# Patient Record
Sex: Female | Born: 1935 | Race: White | Hispanic: No | Marital: Single | Smoking: Former smoker
Health system: Southern US, Community
[De-identification: ages and names within clinical notes are randomized; demographics above are authoritative.]

## PROBLEM LIST (undated history)

## (undated) DIAGNOSIS — N393 Stress incontinence (female) (male): Secondary | ICD-10-CM

## (undated) DIAGNOSIS — R634 Abnormal weight loss: Secondary | ICD-10-CM

## (undated) DIAGNOSIS — R351 Nocturia: Secondary | ICD-10-CM

## (undated) DIAGNOSIS — R5381 Other malaise: Secondary | ICD-10-CM

## (undated) DIAGNOSIS — F329 Major depressive disorder, single episode, unspecified: Secondary | ICD-10-CM

## (undated) DIAGNOSIS — E871 Hypo-osmolality and hyponatremia: Secondary | ICD-10-CM

## (undated) DIAGNOSIS — Z9181 History of falling: Secondary | ICD-10-CM

## (undated) DIAGNOSIS — K5900 Constipation, unspecified: Secondary | ICD-10-CM

## (undated) DIAGNOSIS — H04129 Dry eye syndrome of unspecified lacrimal gland: Secondary | ICD-10-CM

## (undated) DIAGNOSIS — R42 Dizziness and giddiness: Secondary | ICD-10-CM

## (undated) DIAGNOSIS — R269 Unspecified abnormalities of gait and mobility: Secondary | ICD-10-CM

## (undated) DIAGNOSIS — I2510 Atherosclerotic heart disease of native coronary artery without angina pectoris: Secondary | ICD-10-CM

## (undated) DIAGNOSIS — N368 Other specified disorders of urethra: Secondary | ICD-10-CM

## (undated) DIAGNOSIS — N289 Disorder of kidney and ureter, unspecified: Secondary | ICD-10-CM

## (undated) DIAGNOSIS — IMO0002 Reserved for concepts with insufficient information to code with codable children: Secondary | ICD-10-CM

## (undated) DIAGNOSIS — R32 Unspecified urinary incontinence: Secondary | ICD-10-CM

## (undated) DIAGNOSIS — I10 Essential (primary) hypertension: Secondary | ICD-10-CM

## (undated) DIAGNOSIS — E039 Hypothyroidism, unspecified: Secondary | ICD-10-CM

## (undated) DIAGNOSIS — H3530 Unspecified macular degeneration: Secondary | ICD-10-CM

## (undated) DIAGNOSIS — C50919 Malignant neoplasm of unspecified site of unspecified female breast: Secondary | ICD-10-CM

## (undated) DIAGNOSIS — R110 Nausea: Secondary | ICD-10-CM

## (undated) DIAGNOSIS — H353290 Exudative age-related macular degeneration, unspecified eye, stage unspecified: Secondary | ICD-10-CM

## (undated) DIAGNOSIS — I259 Chronic ischemic heart disease, unspecified: Secondary | ICD-10-CM

## (undated) DIAGNOSIS — E785 Hyperlipidemia, unspecified: Secondary | ICD-10-CM

## (undated) DIAGNOSIS — M8448XA Pathological fracture, other site, initial encounter for fracture: Secondary | ICD-10-CM

## (undated) DIAGNOSIS — R5383 Other fatigue: Secondary | ICD-10-CM

## (undated) DIAGNOSIS — R413 Other amnesia: Secondary | ICD-10-CM

## (undated) DIAGNOSIS — B353 Tinea pedis: Secondary | ICD-10-CM

## (undated) DIAGNOSIS — D649 Anemia, unspecified: Secondary | ICD-10-CM

## (undated) HISTORY — PX: AV FISTULA REPAIR: SHX563

## (undated) HISTORY — DX: Tinea pedis: B35.3

## (undated) HISTORY — DX: Reserved for concepts with insufficient information to code with codable children: IMO0002

## (undated) HISTORY — DX: Other specified disorders of urethra: N36.8

## (undated) HISTORY — DX: Exudative age-related macular degeneration, unspecified eye, stage unspecified: H35.3290

## (undated) HISTORY — DX: Dizziness and giddiness: R42

## (undated) HISTORY — DX: Hyperlipidemia, unspecified: E78.5

## (undated) HISTORY — DX: Malignant neoplasm of unspecified site of unspecified female breast: C50.919

## (undated) HISTORY — DX: Essential (primary) hypertension: I10

## (undated) HISTORY — DX: Other amnesia: R41.3

## (undated) HISTORY — DX: Disorder of kidney and ureter, unspecified: N28.9

## (undated) HISTORY — DX: Atherosclerotic heart disease of native coronary artery without angina pectoris: I25.10

## (undated) HISTORY — DX: Hypothyroidism, unspecified: E03.9

## (undated) HISTORY — DX: Constipation, unspecified: K59.00

## (undated) HISTORY — DX: Unspecified urinary incontinence: R32

## (undated) HISTORY — DX: Unspecified abnormalities of gait and mobility: R26.9

## (undated) HISTORY — DX: Chronic ischemic heart disease, unspecified: I25.9

## (undated) HISTORY — DX: Unspecified macular degeneration: H35.30

## (undated) HISTORY — DX: Stress incontinence (female) (male): N39.3

## (undated) HISTORY — DX: Nausea: R11.0

## (undated) HISTORY — PX: CORONARY ANGIOPLASTY WITH STENT PLACEMENT: SHX49

## (undated) HISTORY — DX: Hypo-osmolality and hyponatremia: E87.1

## (undated) HISTORY — DX: Nocturia: R35.1

## (undated) HISTORY — DX: Anemia, unspecified: D64.9

## (undated) HISTORY — DX: Pathological fracture, other site, initial encounter for fracture: M84.48XA

## (undated) HISTORY — DX: Dry eye syndrome of unspecified lacrimal gland: H04.129

## (undated) HISTORY — DX: History of falling: Z91.81

## (undated) HISTORY — PX: MASTECTOMY: SHX3

## (undated) HISTORY — DX: Major depressive disorder, single episode, unspecified: F32.9

## (undated) HISTORY — DX: Other malaise: R53.81

## (undated) HISTORY — DX: Other fatigue: R53.83

## (undated) HISTORY — DX: Abnormal weight loss: R63.4

---

## 1994-03-23 HISTORY — PX: CORONARY ANGIOPLASTY: SHX604

## 1999-10-31 ENCOUNTER — Ambulatory Visit (HOSPITAL_COMMUNITY): Admission: RE | Admit: 1999-10-31 | Discharge: 1999-10-31 | Payer: Self-pay | Admitting: *Deleted

## 2000-10-25 ENCOUNTER — Inpatient Hospital Stay (HOSPITAL_COMMUNITY): Admission: EM | Admit: 2000-10-25 | Discharge: 2000-10-27 | Payer: Self-pay | Admitting: *Deleted

## 2000-10-25 ENCOUNTER — Encounter (HOSPITAL_BASED_OUTPATIENT_CLINIC_OR_DEPARTMENT_OTHER): Payer: Self-pay | Admitting: Internal Medicine

## 2007-11-01 ENCOUNTER — Ambulatory Visit (HOSPITAL_COMMUNITY): Admission: RE | Admit: 2007-11-01 | Discharge: 2007-11-01 | Payer: Self-pay | Admitting: General Surgery

## 2007-11-01 ENCOUNTER — Encounter (HOSPITAL_BASED_OUTPATIENT_CLINIC_OR_DEPARTMENT_OTHER): Payer: Self-pay | Admitting: General Surgery

## 2007-12-03 ENCOUNTER — Encounter (HOSPITAL_BASED_OUTPATIENT_CLINIC_OR_DEPARTMENT_OTHER): Payer: Self-pay | Admitting: General Surgery

## 2007-12-03 ENCOUNTER — Ambulatory Visit (HOSPITAL_COMMUNITY): Admission: RE | Admit: 2007-12-03 | Discharge: 2007-12-04 | Payer: Self-pay | Admitting: General Surgery

## 2007-12-28 ENCOUNTER — Ambulatory Visit: Payer: Self-pay | Admitting: Oncology

## 2009-11-27 ENCOUNTER — Ambulatory Visit: Payer: Self-pay | Admitting: Diagnostic Radiology

## 2009-11-27 ENCOUNTER — Emergency Department (HOSPITAL_BASED_OUTPATIENT_CLINIC_OR_DEPARTMENT_OTHER): Admission: EM | Admit: 2009-11-27 | Discharge: 2009-11-27 | Payer: Self-pay | Admitting: Emergency Medicine

## 2010-05-27 ENCOUNTER — Ambulatory Visit: Payer: Self-pay | Admitting: Cardiology

## 2010-10-02 ENCOUNTER — Ambulatory Visit: Payer: Self-pay | Admitting: Cardiology

## 2010-10-06 ENCOUNTER — Inpatient Hospital Stay (HOSPITAL_COMMUNITY)
Admission: EM | Admit: 2010-10-06 | Discharge: 2010-10-14 | Payer: Self-pay | Source: Home / Self Care | Attending: Internal Medicine | Admitting: Internal Medicine

## 2010-10-07 HISTORY — PX: HIP SURGERY: SHX245

## 2010-10-22 ENCOUNTER — Ambulatory Visit: Payer: Self-pay | Admitting: Cardiology

## 2010-12-23 LAB — BASIC METABOLIC PANEL
BUN: 12 mg/dL (ref 6–23)
BUN: 15 mg/dL (ref 6–23)
BUN: 16 mg/dL (ref 6–23)
BUN: 17 mg/dL (ref 6–23)
BUN: 18 mg/dL (ref 6–23)
BUN: 28 mg/dL — ABNORMAL HIGH (ref 6–23)
BUN: 7 mg/dL (ref 6–23)
BUN: 9 mg/dL (ref 6–23)
BUN: 9 mg/dL (ref 6–23)
CO2: 22 mEq/L (ref 19–32)
CO2: 22 mEq/L (ref 19–32)
CO2: 24 mEq/L (ref 19–32)
CO2: 24 mEq/L (ref 19–32)
CO2: 26 mEq/L (ref 19–32)
CO2: 26 mEq/L (ref 19–32)
Calcium: 7.7 mg/dL — ABNORMAL LOW (ref 8.4–10.5)
Calcium: 7.8 mg/dL — ABNORMAL LOW (ref 8.4–10.5)
Calcium: 7.9 mg/dL — ABNORMAL LOW (ref 8.4–10.5)
Calcium: 8 mg/dL — ABNORMAL LOW (ref 8.4–10.5)
Calcium: 8.1 mg/dL — ABNORMAL LOW (ref 8.4–10.5)
Calcium: 8.2 mg/dL — ABNORMAL LOW (ref 8.4–10.5)
Calcium: 8.4 mg/dL (ref 8.4–10.5)
Calcium: 8.4 mg/dL (ref 8.4–10.5)
Calcium: 8.5 mg/dL (ref 8.4–10.5)
Calcium: 8.8 mg/dL (ref 8.4–10.5)
Calcium: 9.1 mg/dL (ref 8.4–10.5)
Chloride: 103 mEq/L (ref 96–112)
Chloride: 105 mEq/L (ref 96–112)
Chloride: 92 mEq/L — ABNORMAL LOW (ref 96–112)
Chloride: 94 mEq/L — ABNORMAL LOW (ref 96–112)
Chloride: 96 mEq/L (ref 96–112)
Creatinine, Ser: 0.72 mg/dL (ref 0.4–1.2)
Creatinine, Ser: 0.74 mg/dL (ref 0.4–1.2)
Creatinine, Ser: 0.76 mg/dL (ref 0.4–1.2)
Creatinine, Ser: 0.8 mg/dL (ref 0.4–1.2)
Creatinine, Ser: 0.85 mg/dL (ref 0.4–1.2)
Creatinine, Ser: 0.87 mg/dL (ref 0.4–1.2)
Creatinine, Ser: 0.9 mg/dL (ref 0.4–1.2)
Creatinine, Ser: 0.92 mg/dL (ref 0.4–1.2)
Creatinine, Ser: 0.98 mg/dL (ref 0.4–1.2)
Creatinine, Ser: 1.04 mg/dL (ref 0.4–1.2)
GFR calc Af Amer: 59 mL/min — ABNORMAL LOW (ref >60)
GFR calc Af Amer: >60 mL/min (ref >60)
GFR calc Af Amer: >60 mL/min (ref >60)
GFR calc Af Amer: >60 mL/min (ref >60)
GFR calc Af Amer: >60 mL/min (ref >60)
GFR calc Af Amer: >60 mL/min (ref >60)
GFR calc Af Amer: >60 mL/min (ref >60)
GFR calc Af Amer: >60 mL/min (ref >60)
GFR calc non Af Amer: 49 mL/min — ABNORMAL LOW (ref >60)
GFR calc non Af Amer: 53 mL/min — ABNORMAL LOW (ref >60)
GFR calc non Af Amer: 58 mL/min — ABNORMAL LOW (ref >60)
GFR calc non Af Amer: 60 mL/min — ABNORMAL LOW (ref >60)
GFR calc non Af Amer: >60 mL/min (ref >60)
GFR calc non Af Amer: >60 mL/min (ref >60)
GFR calc non Af Amer: >60 mL/min (ref >60)
GFR calc non Af Amer: >60 mL/min (ref >60)
GFR calc non Af Amer: >60 mL/min (ref >60)
Glucose, Bld: 100 mg/dL — ABNORMAL HIGH (ref 70–99)
Glucose, Bld: 101 mg/dL — ABNORMAL HIGH (ref 70–99)
Glucose, Bld: 107 mg/dL — ABNORMAL HIGH (ref 70–99)
Glucose, Bld: 109 mg/dL — ABNORMAL HIGH (ref 70–99)
Glucose, Bld: 124 mg/dL — ABNORMAL HIGH (ref 70–99)
Glucose, Bld: 133 mg/dL — ABNORMAL HIGH (ref 70–99)
Glucose, Bld: 136 mg/dL — ABNORMAL HIGH (ref 70–99)
Glucose, Bld: 94 mg/dL (ref 70–99)
Glucose, Bld: 99 mg/dL (ref 70–99)
Potassium: 3.6 mEq/L (ref 3.5–5.1)
Potassium: 4 mEq/L (ref 3.5–5.1)
Potassium: 4.2 mEq/L (ref 3.5–5.1)
Potassium: 4.2 mEq/L (ref 3.5–5.1)
Potassium: 4.3 mEq/L (ref 3.5–5.1)
Potassium: 4.6 mEq/L (ref 3.5–5.1)
Sodium: 120 mEq/L — ABNORMAL LOW (ref 135–145)
Sodium: 124 mEq/L — ABNORMAL LOW (ref 135–145)
Sodium: 126 mEq/L — ABNORMAL LOW (ref 135–145)
Sodium: 131 mEq/L — ABNORMAL LOW (ref 135–145)
Sodium: 136 mEq/L (ref 135–145)
Sodium: 136 mEq/L (ref 135–145)

## 2010-12-23 LAB — DIFFERENTIAL
Basophils Absolute: 0 10*3/uL (ref 0.0–0.1)
Basophils Absolute: 0 10*3/uL (ref 0.0–0.1)
Basophils Absolute: 0 10*3/uL (ref 0.0–0.1)
Basophils Absolute: 0.1 10*3/uL (ref 0.0–0.1)
Basophils Relative: 0 % (ref 0–1)
Basophils Relative: 0 % (ref 0–1)
Basophils Relative: 1 % (ref 0–1)
Eosinophils Absolute: 0.1 10*3/uL (ref 0.0–0.7)
Eosinophils Absolute: 0.1 10*3/uL (ref 0.0–0.7)
Eosinophils Absolute: 0.3 10*3/uL (ref 0.0–0.7)
Eosinophils Relative: 1 % (ref 0–5)
Eosinophils Relative: 1 % (ref 0–5)
Eosinophils Relative: 2 % (ref 0–5)
Eosinophils Relative: 4 % (ref 0–5)
Lymphocytes Relative: 10 % — ABNORMAL LOW (ref 12–46)
Lymphocytes Relative: 12 % (ref 12–46)
Lymphocytes Relative: 14 % (ref 12–46)
Lymphocytes Relative: 9 % — ABNORMAL LOW (ref 12–46)
Lymphs Abs: 0.9 10*3/uL (ref 0.7–4.0)
Lymphs Abs: 0.9 10*3/uL (ref 0.7–4.0)
Lymphs Abs: 1 10*3/uL (ref 0.7–4.0)
Lymphs Abs: 1 10*3/uL (ref 0.7–4.0)
Monocytes Absolute: 0.5 10*3/uL (ref 0.1–1.0)
Monocytes Absolute: 0.6 10*3/uL (ref 0.1–1.0)
Monocytes Absolute: 0.7 10*3/uL (ref 0.1–1.0)
Monocytes Absolute: 0.9 10*3/uL (ref 0.1–1.0)
Monocytes Relative: 6 % (ref 3–12)
Monocytes Relative: 6 % (ref 3–12)
Neutro Abs: 4.9 10*3/uL (ref 1.7–7.7)
Neutro Abs: 7.6 10*3/uL (ref 1.7–7.7)
Neutro Abs: 9.6 10*3/uL — ABNORMAL HIGH (ref 1.7–7.7)
Neutrophils Relative %: 70 % (ref 43–77)
Neutrophils Relative %: 83 % — ABNORMAL HIGH (ref 43–77)
Neutrophils Relative %: 84 % — ABNORMAL HIGH (ref 43–77)

## 2010-12-23 LAB — CBC
HCT: 21.3 % — ABNORMAL LOW (ref 36.0–46.0)
HCT: 22.9 % — ABNORMAL LOW (ref 36.0–46.0)
HCT: 25.5 % — ABNORMAL LOW (ref 36.0–46.0)
HCT: 25.7 % — ABNORMAL LOW (ref 36.0–46.0)
HCT: 29.4 % — ABNORMAL LOW (ref 36.0–46.0)
HCT: 32.1 % — ABNORMAL LOW (ref 36.0–46.0)
Hemoglobin: 10.8 g/dL — ABNORMAL LOW (ref 12.0–15.0)
Hemoglobin: 7.2 g/dL — ABNORMAL LOW (ref 12.0–15.0)
Hemoglobin: 7.8 g/dL — ABNORMAL LOW (ref 12.0–15.0)
Hemoglobin: 8.6 g/dL — ABNORMAL LOW (ref 12.0–15.0)
Hemoglobin: 8.7 g/dL — ABNORMAL LOW (ref 12.0–15.0)
Hemoglobin: 9.8 g/dL — ABNORMAL LOW (ref 12.0–15.0)
MCH: 31.7 pg (ref 26.0–34.0)
MCH: 31.8 pg (ref 26.0–34.0)
MCH: 31.8 pg (ref 26.0–34.0)
MCH: 31.9 pg (ref 26.0–34.0)
MCH: 32 pg (ref 26.0–34.0)
MCHC: 33.3 g/dL (ref 30.0–36.0)
MCHC: 33.5 g/dL (ref 30.0–36.0)
MCHC: 33.6 g/dL (ref 30.0–36.0)
MCHC: 33.7 g/dL (ref 30.0–36.0)
MCHC: 34.1 g/dL (ref 30.0–36.0)
MCHC: 34.1 g/dL (ref 30.0–36.0)
MCHC: 35.1 g/dL (ref 30.0–36.0)
MCV: 90.6 fL (ref 78.0–100.0)
MCV: 91.8 fL (ref 78.0–100.0)
MCV: 93.1 fL (ref 78.0–100.0)
MCV: 94.7 fL (ref 78.0–100.0)
MCV: 95.5 fL (ref 78.0–100.0)
MCV: 95.5 fL (ref 78.0–100.0)
Platelets: 137 10*3/uL — ABNORMAL LOW (ref 150–400)
Platelets: 154 10*3/uL (ref 150–400)
Platelets: 155 10*3/uL (ref 150–400)
Platelets: 183 10*3/uL (ref 150–400)
Platelets: 194 10*3/uL (ref 150–400)
Platelets: 241 10*3/uL (ref 150–400)
Platelets: 275 10*3/uL (ref 150–400)
Platelets: 306 10*3/uL (ref 150–400)
RBC: 2.32 MIL/uL — ABNORMAL LOW (ref 3.87–5.11)
RBC: 2.46 MIL/uL — ABNORMAL LOW (ref 3.87–5.11)
RBC: 2.69 MIL/uL — ABNORMAL LOW (ref 3.87–5.11)
RBC: 2.97 MIL/uL — ABNORMAL LOW (ref 3.87–5.11)
RBC: 3.08 MIL/uL — ABNORMAL LOW (ref 3.87–5.11)
RBC: 3.39 MIL/uL — ABNORMAL LOW (ref 3.87–5.11)
RDW: 13.7 % (ref 11.5–15.5)
RDW: 14.1 % (ref 11.5–15.5)
RDW: 14.2 % (ref 11.5–15.5)
RDW: 14.2 % (ref 11.5–15.5)
RDW: 14.2 % (ref 11.5–15.5)
RDW: 14.4 % (ref 11.5–15.5)
RDW: 14.7 % (ref 11.5–15.5)
RDW: 15.2 % (ref 11.5–15.5)
WBC: 10.7 10*3/uL — ABNORMAL HIGH (ref 4.0–10.5)
WBC: 11.5 10*3/uL — ABNORMAL HIGH (ref 4.0–10.5)
WBC: 6.5 10*3/uL (ref 4.0–10.5)
WBC: 6.8 10*3/uL (ref 4.0–10.5)
WBC: 7.1 10*3/uL (ref 4.0–10.5)
WBC: 7.4 10*3/uL (ref 4.0–10.5)
WBC: 7.8 10*3/uL (ref 4.0–10.5)
WBC: 9.2 10*3/uL (ref 4.0–10.5)

## 2010-12-23 LAB — IRON: Iron: 21 ug/dL — ABNORMAL LOW (ref 42–135)

## 2010-12-23 LAB — HEMOGLOBIN AND HEMATOCRIT, BLOOD
HCT: 22.2 % — ABNORMAL LOW (ref 36.0–46.0)
Hemoglobin: 7.7 g/dL — ABNORMAL LOW (ref 12.0–15.0)

## 2010-12-23 LAB — PROTIME-INR
INR: 1.01 (ref 0.00–1.49)
INR: 1.25 (ref 0.00–1.49)
INR: 1.25 (ref 0.00–1.49)
INR: 1.32 (ref 0.00–1.49)
INR: 1.56 — ABNORMAL HIGH (ref 0.00–1.49)
INR: 1.91 — ABNORMAL HIGH (ref 0.00–1.49)
Prothrombin Time: 13.5 seconds (ref 11.6–15.2)
Prothrombin Time: 15.9 seconds — ABNORMAL HIGH (ref 11.6–15.2)
Prothrombin Time: 15.9 seconds — ABNORMAL HIGH (ref 11.6–15.2)
Prothrombin Time: 16.6 seconds — ABNORMAL HIGH (ref 11.6–15.2)
Prothrombin Time: 16.6 seconds — ABNORMAL HIGH (ref 11.6–15.2)
Prothrombin Time: 18.9 seconds — ABNORMAL HIGH (ref 11.6–15.2)

## 2010-12-23 LAB — MAGNESIUM
Magnesium: 1.8 mg/dL (ref 1.5–2.5)
Magnesium: 1.8 mg/dL (ref 1.5–2.5)

## 2010-12-23 LAB — URINALYSIS, ROUTINE W REFLEX MICROSCOPIC
Bilirubin Urine: NEGATIVE
Glucose, UA: NEGATIVE mg/dL
Hgb urine dipstick: NEGATIVE
Ketones, ur: NEGATIVE mg/dL
Nitrite: NEGATIVE
Protein, ur: NEGATIVE mg/dL
Specific Gravity, Urine: 1.011 (ref 1.005–1.030)
Urobilinogen, UA: 0.2 mg/dL (ref 0.0–1.0)
pH: 7.5 (ref 5.0–8.0)

## 2010-12-23 LAB — APTT: aPTT: 30 seconds (ref 24–37)

## 2010-12-23 LAB — CROSSMATCH
ABO/RH(D): O POS
Antibody Screen: NEGATIVE
Unit division: 0

## 2010-12-23 LAB — SAMPLE TO BLOOD BANK

## 2010-12-26 ENCOUNTER — Ambulatory Visit (INDEPENDENT_AMBULATORY_CARE_PROVIDER_SITE_OTHER): Payer: Medicare Other | Admitting: Cardiology

## 2010-12-26 DIAGNOSIS — Z79899 Other long term (current) drug therapy: Secondary | ICD-10-CM

## 2010-12-26 DIAGNOSIS — I119 Hypertensive heart disease without heart failure: Secondary | ICD-10-CM

## 2011-01-02 LAB — CULTURE, BLOOD (ROUTINE X 2): Culture: NO GROWTH

## 2011-01-02 LAB — CBC
HCT: 36.5 % (ref 36.0–46.0)
Platelets: 130 10*3/uL — ABNORMAL LOW (ref 150–400)
RDW: 13.4 % (ref 11.5–15.5)
WBC: 2.3 10*3/uL — ABNORMAL LOW (ref 4.0–10.5)

## 2011-01-02 LAB — URINE MICROSCOPIC-ADD ON

## 2011-01-02 LAB — URINALYSIS, ROUTINE W REFLEX MICROSCOPIC
Bilirubin Urine: NEGATIVE
Nitrite: NEGATIVE
Specific Gravity, Urine: 1.012 (ref 1.005–1.030)
pH: 6.5 (ref 5.0–8.0)

## 2011-01-02 LAB — HEPATIC FUNCTION PANEL
ALT: 31 U/L (ref 0–35)
AST: 46 U/L — ABNORMAL HIGH (ref 0–37)
Bilirubin, Direct: 0 mg/dL (ref 0.0–0.3)
Total Protein: 6.3 g/dL (ref 6.0–8.3)

## 2011-01-02 LAB — POCT CARDIAC MARKERS
CKMB, poc: 1.2 ng/mL (ref 1.0–8.0)
Troponin i, poc: <0.05 ng/mL (ref 0.00–0.09)

## 2011-01-02 LAB — BASIC METABOLIC PANEL
BUN: 21 mg/dL (ref 6–23)
Calcium: 8.3 mg/dL — ABNORMAL LOW (ref 8.4–10.5)
Creatinine, Ser: 0.9 mg/dL (ref 0.4–1.2)
GFR calc non Af Amer: 58 mL/min — ABNORMAL LOW (ref >60)
Glucose, Bld: 87 mg/dL (ref 70–99)
Potassium: 4.5 mEq/L (ref 3.5–5.1)

## 2011-01-02 LAB — DIFFERENTIAL
Basophils Absolute: 0 10*3/uL (ref 0.0–0.1)
Lymphocytes Relative: 23 % (ref 12–46)
Lymphs Abs: 0.5 10*3/uL — ABNORMAL LOW (ref 0.7–4.0)
Neutro Abs: 1.4 10*3/uL — ABNORMAL LOW (ref 1.7–7.7)
Neutrophils Relative %: 58 % (ref 43–77)

## 2011-01-03 ENCOUNTER — Telehealth: Payer: Self-pay | Admitting: *Deleted

## 2011-01-03 NOTE — Telephone Encounter (Signed)
Received a call from physical therapist and patient is being discharged today at patient and family request

## 2011-01-15 ENCOUNTER — Telehealth: Payer: Self-pay | Admitting: Cardiology

## 2011-01-29 ENCOUNTER — Encounter: Payer: Self-pay | Admitting: Cardiology

## 2011-02-11 ENCOUNTER — Encounter: Payer: Self-pay | Admitting: Cardiology

## 2011-02-13 ENCOUNTER — Telehealth: Payer: Self-pay | Admitting: Cardiology

## 2011-02-13 NOTE — Telephone Encounter (Signed)
Spoke with caregiver and patient was just d/c from rehab facility.  Gave her info on where she gets her medications.  She will call and get refills and call back if anything doesn't have remaining refills

## 2011-02-13 NOTE — Telephone Encounter (Signed)
OR CELL [phone]/CAREGIVER SAID PT WILL RUN OUT OF MEDS BEFORE HER APPT, CAN ENOUGH BE CALLED IN TO LAST HER UNTIL SHE COMES IN FOR HER NEXT APPT? PLACED CHART IN BOX.

## 2011-02-17 ENCOUNTER — Other Ambulatory Visit: Payer: Self-pay | Admitting: Cardiology

## 2011-02-17 DIAGNOSIS — E876 Hypokalemia: Secondary | ICD-10-CM

## 2011-02-17 MED ORDER — POTASSIUM CHLORIDE CRYS ER 20 MEQ PO TBCR: 20.0000 meq | EXTENDED_RELEASE_TABLET | Freq: Two times a day (BID) | ORAL | Status: DC

## 2011-02-17 NOTE — Telephone Encounter (Signed)
Refilled medication as requested.

## 2011-02-17 NOTE — Telephone Encounter (Signed)
POTASSIUM/USES [facility] [phone] IN [place]. MAIL THE MED. PLACED CHART IN BOX.

## 2011-02-20 ENCOUNTER — Other Ambulatory Visit: Payer: Self-pay | Admitting: *Deleted

## 2011-02-20 DIAGNOSIS — E039 Hypothyroidism, unspecified: Secondary | ICD-10-CM

## 2011-02-20 MED ORDER — LEVOTHYROXINE SODIUM 25 MCG PO TABS: ORAL_TABLET | ORAL | Status: DC

## 2011-02-20 NOTE — Telephone Encounter (Signed)
Refilled  Synthroid by fax to [facility]

## 2011-02-25 NOTE — Op Note (Signed)
Kendra Tapia, Kendra Tapia               ACCOUNT NO.:  [id number]   MEDICAL RECORD NO.:  [id number]          PATIENT TYPE:  OIB   LOCATION:  5123                         FACILITY:  [facility]   PHYSICIAN:  Leonie Man, M.D.   DATE OF BIRTH:  1912-02-15   DATE OF PROCEDURE:  12/03/2007  DATE OF DISCHARGE:                               OPERATIVE REPORT   PREOPERATIVE DIAGNOSIS:  Carcinoma of the left breast.   POSTOPERATIVE DIAGNOSIS:  Carcinoma of the left breast.   PROCEDURE:  Left total mastectomy and sentinel lymph node biopsy.   SURGEON:  Leonie Man, MD.   ASSISTANT:  Currie Paris, MD.   ANESTHESIA:  General.   NOTE:  Kendra Tapia is a 77 year old lady, who on a routine mammogram  was noted to have an area of calcifications, which on biopsy showed  DCIS.  She underwent a wide local excision of this area but did have  positive margins on her excision, as well as within the excisional DCIS  she was noted to have an invasive carcinoma.  She returns to the  operating room now for a total mastectomy and sentinel lymph node  biopsy.  The risks and potential benefits of surgery are well-known to  the patient.  She understands and gives her consent to same.   PROCEDURE:  Following the induction of satisfactory general anesthesia,  the patient was positioned supinely and the left breast is infiltrated  with 5 ml of methylene blue dye in the subareolar region for marking of  the sentinel lymph node.  It should be noted that prior to this  procedure, she was given a technetium sulfa colloid injection for  radiodiagnosis of the sentinel node.  Following this, the breast was  prepped and draped to be included in the sterile operative field  followed by identification of the patient as Kendra Tapia and the  breast to be operated on as the left breast was carried out.  I made an  elliptical incision around inclusive of the nipple, deepening this  through the skin and subcutaneous  tissue, raising a superomedial flap to  the clavicle and to the sternum and an inferolateral flap to the rectus  and to the latissimus dorsi.  The probe was introduced into the axilla  where a solitary sentinel lymph node was identified and forwarded for  pathologic evaluation.  Touch imprint on this node was negative for  carcinoma.  The breast tissues were then stripped from the chest wall  carrying the anterior pectoralis fascia laterally and inferiorly off the  edge of the pectoralis major and minor muscles and serratus anterior  muscles and up into the axilla.  A small portion of the axillary tail  was taken between clamps and secured with ties of #2-0 silk.  The  axillary tail was identified by the suture and the specimen forwarded  for pathologic evaluation.  All areas of dissection then checked for  hemostasis and noted to be dry, sponge and instrument counts were  verified, and the wound was closed after a #19-French Blake drain was  placed onto the flap  for drainage.  The subcutaneous tissues were closed  with  interrupted #2-0 Vicryl sutures, the skin was closed with staples.  A  sterile, compressive dressing was applied to the chest wall, the  anesthetic reversed, and the patient removed from the operating room to  the recovery room in stable condition.  She tolerated the procedure  well.      Leonie Man, M.D.  Electronically Signed     PB/MEDQ  D:  12/03/2007  T:  12/03/2007  Job:  [id number]   cc:   Leonie Man, M.D.  2 copies

## 2011-02-25 NOTE — Op Note (Signed)
NAMEVIVIKA, Kendra Tapia               ACCOUNT NO.:  [id number]   MEDICAL RECORD NO.:  [id number]          PATIENT TYPE:  AMB   LOCATION:  SDS                          FACILITY:  [facility]   PHYSICIAN:  Leonie Man, M.D.   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  Pathology pending.   PROCEDURE:  Needle localized excision of left breast cancer.   SURGEON:  Dr. Lurene Shadow.   ASSISTANT:  OR tech.   ANESTHESIA:  General.   [name] is an elderly 77 year old lady who on a recent mammogram  has an area of abnormality which on biopsy shows DCIS.  She comes to the  operating room now after the risks and potential benefits of surgery  have been fully discussed.  All questions answered, consent obtained.  The MRI shows a solitary fairly well circumscribed lesion of the breast.   Following induction of satisfactory general anesthesia the patient is  positioned supinely.  The left breast is prepped and draped to be  included in the sterile operative field.  Positive identification of the  patient as Kendra Tapia and the site to be operated on as the left  breast. I made an inframammary incision extending from medially to  laterally and raised a skin flap up so as to get to the area of the  localizing needle.  Following the localizing needle down I took a wide  wedge of breast tissue extending up beyond the areolar border and then  down towards the chest wall with entirely complete removal of the  segment of right breast tissue.  This was forwarded for specimen  mammography and specimen mammography revealed that the lesion was  removed.   All areas of dissection were checked for hemostasis and noted to be dry.  Sponge and instrument  counts were verified.  The specimen was then  forwarded for touch prep.  At the time of this dictation touch prep  report has not yet returned.  The subcutaneous tissues were then closed  with interrupted 3-0 Vicryl sutures and the skin was closed  with 5-0 Monocryl running subcuticular stitch and reinforced with Steri-  Strips.  Sterile dressings were applied, the anesthetic reversed and the  patient removed from the operating room to the recovery room in stable  condition.  She tolerated the procedure well.      Leonie Man, M.D.  Electronically Signed     PB/MEDQ  D:  11/01/2007  T:  11/01/2007  Job:  [id number]   cc:   Leonie Man, M.D.

## 2011-02-28 NOTE — Discharge Summary (Signed)
[place]. [facility]  Patient:    Kendra Tapia, Kendra Tapia                      MRN: [id number] Adm. Date:  [id number] Disc. Date: 10/27/00 Attending:  Terald Sleeper Dictator:   Lorin Picket. Claiborne Billings, N.P. CC:         Clovis Pu. Patty Sermons, M.D.  [facility]   Discharge Summary  DATE OF BIRTH:  October 03, 1912  CHIEF COMPLAINT:  Syncopal episode.  HISTORY OF PRESENT ILLNESS:  This 77 year old female lives at home with her sister.  She is followed in primary care at [facility] office by Dr. Baltazar Najjar.  She was watching TV when she began feeling light-headedness and "saw spots."  Started to go to her bedroom and lay down but did not feel she could make the trip.  Sat back down into the chair and her sister witnessed she became syncopal.  EMS was called and a friend gave her a sublingual nitroglycerin.  She has a history of angioplasty in 1996 for these "same symptoms" that occurred prior to the angioplasty.  RCA was the artery that was affected.  Patient states she was a little tired prior to this incident.  Had a slight headache.  No chest palpations or chest pain.  Sister witnessed the syncopal episode which occurred in a chair.  There was diaphoresis during the incident.  No nausea and the patient is reported to seldom take nitroglycerin sublingual.  PAST MEDICAL HISTORY: 1. Coronary artery disease. 2. Hypertension. 3. Hyperlipidemia.  PAST SURGICAL HISTORY: 1. Tonsillectomy. 2. Lumpectomy nonmalignant nodule. 3. Fistula repair.  PREVIOUS PROCEDURES:  Cardiac catheterization with angioplasty in 1996 of the RCA, Dr. Patty Sermons.  MEDICATIONS AT TIME OF ADMISSION: 1. Toprol 50 mg once daily. 2. Norvasc 5 mg daily. 3. Lipitor 10 mg daily at bedtime. 4. Multivitamin. 5. Os-Cal 500 mg daily. 6. Aspirin given in the emergency room 81 mg. 7. Ocuvite. 8. Vioxx as needed.  ALLERGIES:  No known drug  allergies.  REVIEW OF SYSTEMS:  Wears glasses.  Has had a positive cough.  Positive nasal discharge.  Positive constipation.  Positive cervical neck pain.  Otherwise, review of systems negative.  SOCIAL HISTORY:  Patient is single, retired Print production planner.  No alcohol use. Quit smoking approximately 25 years ago.  She did have a 20-year smoking history.  Lives with her sister.  FAMILY HISTORY:  No children.  PHYSICAL EXAMINATION:  VITAL SIGNS ON ADMISSION:  Temperature 97.0, pulse 56, respirations 20, blood pressure 161/72.  GENERAL:  Well-developed elderly female in no acute distress.  Alert and cooperative.  Oriented x 3.  HEENT:  Sclerae and conjunctivae are clear.  Extraocular movements intact. Pupils equal, round, reactive to light.  HEART:  Rate and rhythm regular without murmurs, S3, S4.  LUNGS:  Breath sounds are clear and equal bilaterally.  ABDOMEN:  Soft, positive bowel sounds.  No pain or masses with palpation. No organomegaly noted.  EXTREMITIES:  No edema.  No cyanosis noted.  DP and PT pulses 2+ bilaterally. Pink and warm without cyanosis.  MUSCULOSKELETAL:  Mild crepitus in the knee joints bilaterally.  NEUROLOGIC:  Cranial nerves II-XII grossly intact.  No focal defects.  Alert and oriented x 3.  HOSPITAL COURSE:  Patient was admitted for further evaluation of the syncopal event.  Admission labs: Arterial blood gas: pH 7.440, pCO2 41.8, bicarb 28.0. Metabolic panel: Sodium 137, potassium 4.2, chloride 102,  CO2 30, BUN 22, and creatinine 1.6.  Hemoglobin was 11.0 and hematocrit 33.0.  Sed rate was 10. Cardiac enzymes unremarkable.  CK 58, MB 2.0, relative index not listed, troponin I of 0.02.  Followup enzymes: CK 63, MB 1.9, troponin I 0.01.  Final enzymes: CK 60, MB 2.0, troponin I of 0.01.  Lipid profile: All values essentially normal with cholesterol 138, triglyceride 115, HDL 50, LDL 65. Urinalysis appeared unremarkable.  Chest x-ray showed  cardiomegaly but no active lung disease.  Patient again was admitted for further evaluation of this of this syncopal episode.  Placed on the telemetry unit.  Carotid Doppler study done showing no evidence of significant ICA stenosis and vertebral artery flow was antegrade.  An echocardiogram was done ____________ finding overall left ventricular systolic function vigorous.  No diastolic evidence of left ventricular regional wall motion abnormality.  There was an increase relative contribution of atrial contraction to left ventricular filling. Aortic valve thickness was mildly increased with trivial aortic valvular regurgitation.  Study otherwise unremarkable.  Seen in cardiac consult by Dr. Patty Sermons.  He recommended checking stool for occult blood which was negative x 2.  Noted hemoglobin stable 11.3.  Patient was evaluated by physical therapy.  Was able to ambulate without incident 400 feet independently.  O2 saturations 97%, pulse 83, blood pressure 158/79 with ambulation.  Dr. Patty Sermons plans to have his office contact the patient at home for arrangement of a Cardiolite study.  I did a postural blood pressure check at the bedside.  Supine blood pressure 130/60.  Standing blood pressure declined slightly 125/60 with a mild compensatory increase in heart rate.  Patient remained stable at discharge.  Followup with Dr. Baltazar Najjar approximately two weeks post discharge.  Followup with Dr. Patty Sermons per his arrangement.  CONDITION AT TIME OF DISCHARGE:  Stable.  DISCHARGE LABORATORIES:  Followup CBC October 27, 2000, found wbc 5.9, hemoglobin low though stable at 11.2, hematocrit 34.4, platelets 203.  Stool was negative x 2 for occult blood.  DISCHARGE MEDICATIONS: 1. Norvasc 5 mg daily. 2. Lipitor 10 mg daily. 3. Multivitamin daily. 4. Os-Cal 500 mg daily. 5. Toprol XL 50 mg daily.  DISCHARGE DIAGNOSES:  1. Syncopal episode as yet unclear etiology with cardiology followup  and    Cardiolite study as an outpatient. 2. Mild anemia with occult blood negative stool. 3. Previous history of coronary artery disease and stent placement.  SPECIAL DISCHARGE INSTRUCTIONS: 1. Followup with Dr. Patty Sermons with his office contacting the patient at home    and arrangement Cardiolite study. 2. Followup with Dr. Baltazar Najjar three to four weeks post discharge.  Would    suggest followup hemoglobin and hematocrit at that time.  Anemia study and    iron if indicated at that time.  CONDITION AT TIME OF DISCHARGE:  Stable.  DISCHARGE PROCESS:  Less than 30 minutes. DD:  10/27/00 TD:  10/27/00 Job: [id number]

## 2011-02-28 NOTE — H&P (Signed)
[place]. [facility]  Patient:    Kendra Tapia, Kendra Tapia                        MRN: [id number] Adm. Date:  10/25/00 Attending:  Truitt Merle, M.D.                         History and Physical  CHIEF COMPLAINT: The patient is an 77 year old female who presented to the emergency room after a syncope episode.  HISTORY OF PRESENT ILLNESS: She was at home watching television when she felt light headed and "saw spots" in front of her eyes.  She subsequently had a syncopal episode witnessed by her sister after her sister gave her one sublingual nitroglycerin.  The patient had an angioplasty in 1996 and at that time she experienced similar symptoms prior to the discovery that her RCA was partially occluded.  The patient has been feeling "a little tired" prior to the incident today.  She has a slight headache at present.  She denied chest pain or palpitations prior to the syncopal episode.  She has had some diaphoresis and nausea associated with the episode today.  The patient has used her sublingual nitroglycerin very seldom.  She has no history of MI or CVA.  PAST MEDICAL HISTORY:  1. Coronary artery disease.  2. Hypertension.  3. Hypercholesterolemia.  PAST SURGICAL HISTORY:  1. Tonsillectomy.  2. Lumpectomy - nonmalignant benign nodules of bilateral breast.  3. Fistula repair in 1996 secondary to catheterization.  CURRENT MEDICATIONS:  1. Toprol extended release 50 mg q.d.  2. Norvasc 5 mg q.d.  3. Lipitor 10 mg q.d.  4. Multivitamin 1 p.o. q.d.  5. Os-Cal 500 mg 1 p.o. q.d.  6. Aspirin 81 mg 1 p.o. q.d.  7. Ocuvite as-needed.  8. Vioxx as-needed.  PROCEDURES: Cardiac catheterization with angioplasty in 1996 of the right coronary artery by Dr. Patty Sermons.  ALLERGIES: No known drug allergies.  REVIEW OF SYSTEMS: The patient wears glasses.  Positive cough with mucus colored sputum.  Positive nasal discharge.  Positive constipation.  Cervical neck  pain.  Otherwise, the Review Of Systems is negative.  SOCIAL HISTORY: She is single and has never been married.  She is a retired Print production planner.  She lives upstairs in a house with her sister and her sisters family.  She and her sister live together and her nieces and nephews live below.  No alcohol.  She quit smoking 25 years ago.  She smoked mildly for approximately 20 years.  FAMILY HISTORY: No children.  Her sister is Elyse Jarvis [phone] or [phone]).  LABORATORY DATA: ABG shows a pH of 7.440, pO2 41.8, bicarbonate 28.  Basic metabolic profile normal.  Hemoglobin and hematocrit normal.  EKG shows normal sinus rhythm at 65 beats per minute and incomplete right bundle branch block; Q in 1 plus aVL; poor R wave progression; flat T in V2. Chest x-ray is pending.  PHYSICAL EXAMINATION:  VITAL SIGNS: Temperature 97.0 degrees, respirations 20, pulse 66, blood pressure 161/72.  GENERAL: The patient is an elderly female who looks younger than her given years, sitting up in bed and in no distress.  HEART: Regular rate and rhythm.  Normal S1 and S2.  No murmur, gallop, or rub.  LUNGS: Clear to auscultation bilaterally.  ABDOMEN: Normal bowel sounds.  Nontender, nondistended.  No hepatosplenomegaly.  EXTREMITIES: No pitting edema.  Dorsalis pedis pulses and posterior tibial pulses  2+ bilaterally.  Toes were pink and warm.  NEUROLOGIC: Mental status shows her to be alert and oriented x 3.  She answers all questions well.  MUSCULOSKELETAL: Mild crepitus in the knees bilaterally.  ASSESSMENT: The patient is an 77 year old female with coronary artery disease, status post angioplasty, who presents with syncope.  The patient had similar symptoms prior to her last angioplasty in 1996 performed by Dr. Patty Sermons.  PLAN: Admit to telemetry to rule out arrhythmia.  Rule out for myocardial infarction with cardiac echocardiogram and CPK-MB.  Carotid Dopplers.  Dr. Patty Sermons is to see  the patient in consultation.  Check TSH, CBC and Differential, platelets, urinalysis, comprehensive metabolic panel, sedimentation rate, and lipid profile. DD:  10/25/00 TD:  10/25/00 Job: [id number]

## 2011-02-28 NOTE — Discharge Summary (Signed)
[place]. [facility]  Patient:    Kendra Tapia, Kendra Tapia                      MRN: [id number] Adm. Date:  [id number] Disc. Date: 10/26/00 Attending:  Terald Sleeper CC:         Terald Sleeper, M.D.   Discharge Summary  HISTORY:  This is an 77 year old single Caucasian female admitted on October 25, 2000, after having witnessed syncope at home.  This woman has a past history of known coronary artery disease.  We first saw her in our office in May of 1997.  AT that time she had recently moved here from [place]. While living in [place], 2 years earlier, in 1995, she had angioplasty of a proximal dominant right coronary artery stenosis.  At that time she had an ejection fraction of 74%.  She states that her presenting symptoms at that time were dizziness and syncope and does not recall chest pain although her catheterization note does mention unstable angina pectoris as a diagnosis. Since the angioplasty the patient has done well.  She has not been experiencing any exertional chest discomfort.  She walks for exercise at home.  MEDICATIONS:  She has had a history of mild hypertension and has been on the following medications: 1. 81 mg aspirin daily. 2. Multivitamin once a day. 3. Meclizine 25 mg p.r.n. 4. Nitrostat 1/150 sublingual at p.r.n. which she has not been using. 5. Lipitor 10 mg daily. 6. Toprol XL 50 once a day. 7. Norvasc 5 mg daily. 8. Ocuvite once a day.  Her health was good and yesterday as she was sitting watching television with family members she suddenly felt light-headed and saw stars and then blacked out.  She did not have any seizure activity.  She had some diaphoresis.  There was no nausea.  The patient apparently, when she began having the lightheadedness, and saw spots in front of her eyes, a friend gave her a nitro after which she had syncope.  FAMILY HISTORY:  Reveals that she has a father who died of heart disease  at age 75, mother died at age 57 of heart disease.  She has a brother who died of colitis and another brother died of cancer.  SOCIAL HISTORY:  Reveals that she single.  She is a retired Print production planner. She does not use alcohol.  She is former smoker but quit smoking about 25 years ago and was never a heavy smoker.  She lives with her sister.  PREVIOUS SURGERY:  Breast biopsies which were benign and angioplasty in 1995.  ALLERGIES:  he is questionably allergic to PROGESTERONE.  REVIEW OF SYSTEMS: Negative except for present illness.  She does have a history of mild constipation and has not had a bowel movement so far in the hospital.  PHYSICAL EXAMINATION:  Her blood pressure is 128/70, the pulse is 72, and regular.  Respirations are normal.  HEENT:  Unremarkable.  Color is good.  Jugular venous pressure is normal. CarotidS:  No bruits.  Thyroid:  Not enlarged or tender.  CHEST:  Clear.  HEART:  Reveals a soft systolic ejection murmur along the left sternal edge. The patient has no gallop, there is no diastolic murmur audible.  ABDOMEN:  The abdomen is soft and nontender.  EXTREMITIES:  Reveal good peripheral pulses, no phlebitis or edema.  She has had a prior repair or a AV fistula in the right femoral artery which is a  result of the previous cardiac catheterization 2 years earlier.  ECHOCARDIOGRAM:  Her echocardiogram today showed no cause for syncope.  She does not have any aortic stenosis and her LV function is normal with no segmental wall motion abnormality.  The patient had carotid Dopplers today which shows no evidence of any significant internal carotid artery stenosis.  LABORATORY DATA:  Unremarkable except for a hemoglobin of 11, and hematocrit of 33.  Serial enzymes including CK MB are negative x 3.  Troponin I negative x 3.  Cholesterol is 138.  IMPRESSION: 1. Syncope of uncertain etiology. 2. Past history of coronary artery disease. 3. No evidence of any  significant valvular heart disease.  RECOMMENDATION:  Would follow up with CBC in the morning and check stool for occult blood if available.  If those are negative or stable, and if she is able to walk in the hall this evening with no further symptoms I would favor discharge on October 28, 1999, to be followed closely in my office.  We will consider an outpatient adenosine cardiolite stress test to evaluate her coronary artery tree. DD:  10/26/00 TD:  10/26/00 Job: [id number]

## 2011-03-02 DIAGNOSIS — R413 Other amnesia: Secondary | ICD-10-CM

## 2011-03-02 HISTORY — DX: Other amnesia: R41.3

## 2011-03-12 ENCOUNTER — Encounter: Payer: Self-pay | Admitting: Cardiology

## 2011-03-12 ENCOUNTER — Other Ambulatory Visit: Payer: Self-pay | Admitting: *Deleted

## 2011-03-12 DIAGNOSIS — Z79899 Other long term (current) drug therapy: Secondary | ICD-10-CM

## 2011-03-12 DIAGNOSIS — E7800 Pure hypercholesterolemia, unspecified: Secondary | ICD-10-CM

## 2011-03-13 ENCOUNTER — Encounter: Payer: Self-pay | Admitting: Cardiology

## 2011-03-13 ENCOUNTER — Other Ambulatory Visit (INDEPENDENT_AMBULATORY_CARE_PROVIDER_SITE_OTHER): Payer: Medicare Other | Admitting: *Deleted

## 2011-03-13 ENCOUNTER — Ambulatory Visit (INDEPENDENT_AMBULATORY_CARE_PROVIDER_SITE_OTHER): Payer: Medicare Other | Admitting: Cardiology

## 2011-03-13 DIAGNOSIS — Z79899 Other long term (current) drug therapy: Secondary | ICD-10-CM

## 2011-03-13 DIAGNOSIS — E7800 Pure hypercholesterolemia, unspecified: Secondary | ICD-10-CM

## 2011-03-13 DIAGNOSIS — E039 Hypothyroidism, unspecified: Secondary | ICD-10-CM

## 2011-03-13 DIAGNOSIS — I2510 Atherosclerotic heart disease of native coronary artery without angina pectoris: Secondary | ICD-10-CM

## 2011-03-13 DIAGNOSIS — I119 Hypertensive heart disease without heart failure: Secondary | ICD-10-CM

## 2011-03-13 LAB — BASIC METABOLIC PANEL
BUN: 24 mg/dL — ABNORMAL HIGH (ref 6–23)
CO2: 28 mEq/L (ref 19–32)
Chloride: 101 mEq/L (ref 96–112)
Creatinine, Ser: 0.9 mg/dL (ref 0.4–1.2)
Potassium: 4.9 mEq/L (ref 3.5–5.1)

## 2011-03-13 LAB — CBC WITH DIFFERENTIAL/PLATELET
Basophils Relative: 0.5 % (ref 0.0–3.0)
Eosinophils Relative: 2.4 % (ref 0.0–5.0)
HCT: 34.5 % — ABNORMAL LOW (ref 36.0–46.0)
Hemoglobin: 11.7 g/dL — ABNORMAL LOW (ref 12.0–15.0)
Lymphocytes Relative: 21.4 % (ref 12.0–46.0)
Lymphs Abs: 1.3 10*3/uL (ref 0.7–4.0)
Monocytes Relative: 7.1 % (ref 3.0–12.0)
Neutro Abs: 4.1 10*3/uL (ref 1.4–7.7)
RBC: 3.65 Mil/uL — ABNORMAL LOW (ref 3.87–5.11)

## 2011-03-13 LAB — LIPID PANEL
Cholesterol: 174 mg/dL (ref 0–200)
LDL Cholesterol: 95 mg/dL (ref 0–99)
Total CHOL/HDL Ratio: 3
Triglycerides: 67 mg/dL (ref 0.0–149.0)
VLDL: 13.4 mg/dL (ref 0.0–40.0)

## 2011-03-13 LAB — HEPATIC FUNCTION PANEL
Albumin: 3.8 g/dL (ref 3.5–5.2)
Total Protein: 5.9 g/dL — ABNORMAL LOW (ref 6.0–8.3)

## 2011-03-13 NOTE — Progress Notes (Signed)
Clearnce Sorrel Date of Birth:  1912/03/09 [facility] / [facility] [address]   [address] [place], [place]  [address] [phone]           Fax   [phone]  History of Present Illness: This pleasant 77 year old woman is seen for a followup office visit.  She has a history of previous coronary artery disease.  She had cardiac catheterization elsewhere in 1995 and had angioplasty of her right coronary artery.  Postoperatively she had a AV fistula which required repair in the right femoral artery.  She's had a long history of essential hypertension.  She's had a past history of dizziness and falls but no recent symptoms.  Current Outpatient Prescriptions  Medication Sig Dispense Refill  . aspirin 81 MG tablet Take 81 mg by mouth daily.        . calcium carbonate (OS-CAL) 600 MG TABS Take 600 mg by mouth daily.        Marland Kitchen levothyroxine (LEVOTHROID) 25 MCG tablet Brand only  30 tablet  11  . metoprolol (TOPROL-XL) 50 MG 24 hr tablet Take 50 mg by mouth daily.        . Multiple Vitamins-Minerals (OCUVITE PO) Take by mouth daily.        . nitroGLYCERIN (NITROSTAT) 0.4 MG SL tablet Place 0.4 mg under the tongue every 5 (five) minutes as needed.        . Polyethylene Glycol 3350 (MIRALAX PO) Take by mouth daily.        Marland Kitchen telmisartan (MICARDIS) 40 MG tablet Take 40 mg by mouth daily.        Marland Kitchen DISCONTD: potassium chloride SA (K-DUR,KLOR-CON) 20 MEQ tablet Take 1 tablet (20 mEq total) by mouth 2 (two) times daily.  180 tablet  3  . DISCONTD: zolpidem (AMBIEN) 5 MG tablet Take 5 mg by mouth at bedtime as needed.          Allergies  Allergen Reactions  . Lipitor (Atorvastatin Calcium)     MUSCLE ACHES  . Micardis (Telmisartan)     Patient Active Problem List  Diagnoses  . Coronary artery disease  . Benign hypertensive heart disease without heart failure  . Hypothyroidism  . Hypercholesterolemia    History  Smoking status  . Former Smoker  . Quit date: 03/11/1981    Smokeless tobacco  . Not on file    History  Alcohol Use No    Family History  Problem Relation Age of Onset  . Heart disease Mother   . Heart attack Father     Review of Systems: Constitutional: no fever chills diaphoresis or fatigue or change in weight.  Head and neck: no hearing loss, no epistaxis, no photophobia or visual disturbance. Respiratory: No cough, shortness of breath or wheezing. Cardiovascular: No chest pain peripheral edema, palpitations. Gastrointestinal: No abdominal distention, no abdominal pain, no change in bowel habits hematochezia or melena. Genitourinary: No dysuria, no frequency, no urgency, no nocturia. Musculoskeletal:No arthralgias, no back pain, no gait disturbance or myalgias. Neurological: No dizziness, no headaches, no numbness, no seizures, no syncope, no weakness, no tremors. Hematologic: No lymphadenopathy, no easy bruising. Psychiatric: No confusion, no hallucinations, no sleep disturbance.    Physical Exam: Filed Vitals:   03/13/11 1029  BP: 146/80  Pulse: 80  The general appearance reveals an alert woman in no acute distress.Pupils equal and reactive.   Extraocular Movements are full.  There is no scleral icterus.  The mouth and pharynx are normal.  The neck is supple.  The carotids reveal no bruits.  The jugular venous pressure is normal.  The thyroid is not enlarged.  There is no lymphadenopathy.The chest is clear to percussion and auscultation. There are no rales or rhonchi. Expansion of the chest is symmetrical.  Heart reveals grade 2/6 systolic ejection murmur at the base.The abdomen is soft and nontender. Bowel sounds are normal. The liver and spleen are not enlarged. There Are no abdominal masses. There are no bruits.  The pedal pulses are good.  There is no phlebitis or edema.  There is no cyanosis or clubbing.Strength is normal and symmetrical in all extremities.  There is no lateralizing weakness.  There are no sensory deficits.The  skin is warm and dry.  There is no rash.   Assessment / Plan: Continue present medications.  Recheck in 4 months for office visit and fasting lab work including CBC.

## 2011-03-13 NOTE — Assessment & Plan Note (Signed)
The patient has not been expressing any recent dizziness or syncope.  She has had no recent falls.  She denies any headaches.  He has had occasional leg cramps.  She remains quite active.  She has a lady that stays with her during day and her nephew is there in the home with her at night

## 2011-03-13 NOTE — Assessment & Plan Note (Signed)
The patient has not been experiencing any significant chest pains recently.  Occasionally during the night she will have to take a single sublingual nitroglycerin for chest discomfort.  She does have ischemic heart disease history and has PTCA in 1995 of her right coronary artery.

## 2011-03-14 ENCOUNTER — Telehealth: Payer: Self-pay | Admitting: Cardiology

## 2011-03-14 NOTE — Telephone Encounter (Signed)
Adv. Lab results

## 2011-03-14 NOTE — Telephone Encounter (Signed)
PATIENT SAID RETURNING NURSE'S PHONE CALL.

## 2011-04-07 ENCOUNTER — Encounter: Payer: Self-pay | Admitting: Cardiology

## 2011-04-07 DIAGNOSIS — R262 Difficulty in walking, not elsewhere classified: Secondary | ICD-10-CM

## 2011-04-09 ENCOUNTER — Encounter: Payer: Self-pay | Admitting: Cardiology

## 2011-04-24 ENCOUNTER — Encounter: Payer: Self-pay | Admitting: Cardiology

## 2011-05-06 ENCOUNTER — Emergency Department (HOSPITAL_COMMUNITY): Payer: Medicare Other

## 2011-05-06 ENCOUNTER — Telehealth: Payer: Self-pay | Admitting: Cardiology

## 2011-05-06 ENCOUNTER — Emergency Department (HOSPITAL_COMMUNITY)
Admission: EM | Admit: 2011-05-06 | Discharge: 2011-05-06 | Disposition: A | Discharge status: Home / Self Care | Payer: Medicare Other | Attending: Emergency Medicine | Admitting: Emergency Medicine

## 2011-05-06 DIAGNOSIS — S22009A Unspecified fracture of unspecified thoracic vertebra, initial encounter for closed fracture: Secondary | ICD-10-CM | POA: Insufficient documentation

## 2011-05-06 DIAGNOSIS — W010XXA Fall on same level from slipping, tripping and stumbling without subsequent striking against object, initial encounter: Secondary | ICD-10-CM | POA: Insufficient documentation

## 2011-05-06 DIAGNOSIS — Z853 Personal history of malignant neoplasm of breast: Secondary | ICD-10-CM | POA: Insufficient documentation

## 2011-05-06 DIAGNOSIS — I10 Essential (primary) hypertension: Secondary | ICD-10-CM | POA: Insufficient documentation

## 2011-05-06 DIAGNOSIS — Y92009 Unspecified place in unspecified non-institutional (private) residence as the place of occurrence of the external cause: Secondary | ICD-10-CM | POA: Insufficient documentation

## 2011-05-06 DIAGNOSIS — S32009A Unspecified fracture of unspecified lumbar vertebra, initial encounter for closed fracture: Secondary | ICD-10-CM | POA: Insufficient documentation

## 2011-05-06 NOTE — Telephone Encounter (Signed)
PT HAS APPT IN SEPT BUT HAD FALL TODAY AND WAS TOLD TO FOLLOW UP WITH DR BB. CALL NEPHEW BEN AT # LISTED UNTIL 4PM TODAY.

## 2011-05-08 ENCOUNTER — Telehealth: Payer: Self-pay | Admitting: Cardiology

## 2011-05-08 NOTE — Telephone Encounter (Signed)
Discussed with nephew and he wants patient seen next week, am going to arrange ortho appointment

## 2011-05-08 NOTE — Telephone Encounter (Signed)
Called wanting to touch base with you and speak with you about what you were talking about the other day in regards to his aunt. He said you would know what he was talking about. Please call back. You have the chart.

## 2011-05-08 NOTE — Telephone Encounter (Signed)
Recent fall and  Dr. Patty Sermons primary care doctor.  Scheduled orthopedic appointment and appointment with  Dr. Patty Sermons next week

## 2011-05-12 ENCOUNTER — Inpatient Hospital Stay (HOSPITAL_COMMUNITY)
Admission: EM | Admit: 2011-05-12 | Discharge: 2011-05-16 | DRG: 552 | Disposition: A | Discharge status: Skilled Nursing Facility | Payer: Medicare Other | Attending: Internal Medicine | Admitting: Internal Medicine

## 2011-05-12 DIAGNOSIS — Z853 Personal history of malignant neoplasm of breast: Secondary | ICD-10-CM

## 2011-05-12 DIAGNOSIS — E785 Hyperlipidemia, unspecified: Secondary | ICD-10-CM | POA: Diagnosis present

## 2011-05-12 DIAGNOSIS — E876 Hypokalemia: Secondary | ICD-10-CM | POA: Diagnosis present

## 2011-05-12 DIAGNOSIS — S22009A Unspecified fracture of unspecified thoracic vertebra, initial encounter for closed fracture: Principal | ICD-10-CM | POA: Diagnosis present

## 2011-05-12 DIAGNOSIS — Z9861 Coronary angioplasty status: Secondary | ICD-10-CM

## 2011-05-12 DIAGNOSIS — I2510 Atherosclerotic heart disease of native coronary artery without angina pectoris: Secondary | ICD-10-CM | POA: Diagnosis present

## 2011-05-12 DIAGNOSIS — E871 Hypo-osmolality and hyponatremia: Secondary | ICD-10-CM | POA: Diagnosis present

## 2011-05-12 DIAGNOSIS — W010XXA Fall on same level from slipping, tripping and stumbling without subsequent striking against object, initial encounter: Secondary | ICD-10-CM | POA: Diagnosis present

## 2011-05-12 DIAGNOSIS — N179 Acute kidney failure, unspecified: Secondary | ICD-10-CM | POA: Diagnosis present

## 2011-05-12 DIAGNOSIS — Y92009 Unspecified place in unspecified non-institutional (private) residence as the place of occurrence of the external cause: Secondary | ICD-10-CM

## 2011-05-12 DIAGNOSIS — I10 Essential (primary) hypertension: Secondary | ICD-10-CM | POA: Diagnosis present

## 2011-05-12 DIAGNOSIS — E039 Hypothyroidism, unspecified: Secondary | ICD-10-CM | POA: Diagnosis present

## 2011-05-12 DIAGNOSIS — D638 Anemia in other chronic diseases classified elsewhere: Secondary | ICD-10-CM | POA: Diagnosis present

## 2011-05-12 LAB — URINALYSIS, ROUTINE W REFLEX MICROSCOPIC
Glucose, UA: NEGATIVE mg/dL
Protein, ur: 30 mg/dL — AB

## 2011-05-12 LAB — POCT I-STAT, CHEM 8
Creatinine, Ser: 2 mg/dL — ABNORMAL HIGH (ref 0.50–1.10)
Glucose, Bld: 108 mg/dL — ABNORMAL HIGH (ref 70–99)
Hemoglobin: 11.6 g/dL — ABNORMAL LOW (ref 12.0–15.0)
TCO2: 22 mmol/L (ref 0–100)

## 2011-05-12 LAB — CBC
Hemoglobin: 11.3 g/dL — ABNORMAL LOW (ref 12.0–15.0)
MCH: 31.3 pg (ref 26.0–34.0)
MCHC: 34.1 g/dL (ref 30.0–36.0)
Platelets: 226 10*3/uL (ref 150–400)

## 2011-05-12 LAB — URINE MICROSCOPIC-ADD ON

## 2011-05-13 ENCOUNTER — Inpatient Hospital Stay (HOSPITAL_COMMUNITY): Payer: Medicare Other

## 2011-05-13 LAB — URINE CULTURE
Colony Count: 15000
Culture  Setup Time: 201207302354

## 2011-05-13 LAB — BASIC METABOLIC PANEL
BUN: 26 mg/dL — ABNORMAL HIGH (ref 6–23)
Chloride: 99 mEq/L (ref 96–112)
GFR calc Af Amer: 45 mL/min — ABNORMAL LOW (ref >60)
Potassium: 3.8 mEq/L (ref 3.5–5.1)
Sodium: 132 mEq/L — ABNORMAL LOW (ref 135–145)

## 2011-05-13 NOTE — H&P (Signed)
NAMESNOW, PEOPLES NO.:  [id number]  MEDICAL RECORD NO.:  [id number]  LOCATION:  0005                         FACILITY:  [facility]  PHYSICIAN:  Mauro Kaufmann, MD         DATE OF BIRTH:  03/11/1912  DATE OF ADMISSION:  05/12/2011 DATE OF DISCHARGE:                             HISTORY & PHYSICAL   CHIEF COMPLAINT:  Back pain.  HISTORY OF PRESENT ILLNESS:  This is a 77 year old female who was brought to the hospital by the patient's nephew because the patient has been having the back pain since the fall 1 week ago.  The patient at that time was found to have compression fracture of thoracic spine, an x- ray showed 30% compression deformity of T8.  It also showed a compression deformity of L1, which was chronic.  The x-ray of the ribs showed age-indeterminate anterior right 11th rib fracture.  The patient at this time says that her pain is very severe and she has difficulty walking.  Though the patient is 77year-old, but she is very functional, she is able to walk with a walker and she is mentally very alert and as per nephew, she only gets intermittent episodes of confusion.  PAST MEDICAL HISTORY:  Significant for, 1. Hypertension. 2. History breast cancer. 3. Hyperlipidemia. 4. Renal insufficiency requiring dialysis.  PAST SURGICAL HISTORY: 1. The patient has a coronary stent. 2. Mastectomy.  SOCIAL HISTORY:  The patient denied tobacco abuse.  No history of alcohol abuse.  No history of illicit drug abuse.  ALLERGIES:  No known drug allergies.  CURRENT MEDICATIONS:  The patient takes include, 1. Aspirin 81 mg p.o. daily. 2. Multivitamin 1 p.o. daily. 3. Percocet 5/325 one tablet p.o. daily as needed. 4. Synthroid 25 mcg once a day. 5. Toprol-XL 50 mg once a day.  REVIEW OF SYSTEMS:  HEENT:  There is no headache, no blurred vision.  No runny nose.  No sore throat.  NECK:  Positive history of hypothyroidism. CHEST:  No history of COPD or emphysema.   HEART:  Positive history of coronary stent in the past.  Positive history of heart murmur. NEUROLOGIC:  No history of stroke or TIA in the past.  GENITOURINARY: No dysuria, urgency, or frequency of urination.  GASTROINTESTINAL:  No nausea, vomiting, or diarrhea.  The patient does have history of constipation.  PHYSICAL EXAMINATION:  VITAL SIGNS:  The patient's blood pressure is 124/67, temp 97.4, pulse 76, and respiration 16. HEENT:  Head is atraumatic and normocephalic.  Eyes, extraocular muscles intact.  Oral mucosa is pink and moist. NECK:  Supple. CHEST:  Clear to auscultation bilaterally. HEART:  S1, S2 is regular rate rhythm.  There is positive systolic murmur auscultated in the mitral area. ABDOMEN:  Soft, nontender.  No organomegaly. EXTREMITIES:  No cyanosis, no clubbing, no edema. NEUROLOGIC:  Cranial nerve II through XII are grossly intact.  Motor exam is 5/5 in both upper and lower extremities. BACK:  The patient has some mild tenderness in the lower thoracic region at the level of T8 and T9.  IMAGING STUDIES:  As in the HPI.  PERTINENT LABORATORY DATA:  WBC is 13.1, hemoglobin 11.6,  hematocrit 34.0, and platelet count 226.  Sodium 133, potassium 3.6, and chloride 100.  BUN 32 and creatinine 2.00.  ASSESSMENT: 1. Back pain secondary to thoracic compression fracture. 2. Acute renal insufficiency. 3. Urinary tract infection.  PLAN: 1. Back pain due to thoracic compression fracture.  We will continue     the patient on oxycodone 5 mg p.o. q.4 h. p.r.n. along with Tylenol     650 mg p.o. q.4 h. p.r.n..  We will get the Interventional     Neurology to evaluate for possible kyphoplasty for thoracic     compression fracture. 2. Urinary tract infection.  The patient will be put on Rocephin.  We     will obtain a urine culture sensitivity. 3. Acute renal insufficiency.  We will start the patient on IV normal     saline 75 mL per hour and check the B-MET in the morning.   We will     also obtain a PT/OT consult to evaluate for possible rehab versus     going home on home PT/OT. 4. Hypothyroidism.  The patient will be continued on Synthroid 25 mcg     p.o. daily. 5. Hypertension.  We will continue the patient on Toprol-XL 50 mg p.o.     daily.     Mauro Kaufmann, MD     GL/MEDQ  D:  05/12/2011  T:  05/12/2011  Job:  [id number]  Electronically Signed by Mauro Kaufmann  on 05/13/2011 12:09:43 PM

## 2011-05-14 ENCOUNTER — Inpatient Hospital Stay (HOSPITAL_COMMUNITY): Payer: Medicare Other

## 2011-05-14 ENCOUNTER — Telehealth: Payer: Self-pay | Admitting: Cardiology

## 2011-05-14 LAB — CBC
MCH: 31.4 pg (ref 26.0–34.0)
MCHC: 34.4 g/dL (ref 30.0–36.0)
MCV: 91.2 fL (ref 78.0–100.0)
Platelets: 251 10*3/uL (ref 150–400)
RDW: 14 % (ref 11.5–15.5)

## 2011-05-14 LAB — BASIC METABOLIC PANEL
CO2: 22 mEq/L (ref 19–32)
Calcium: 8.8 mg/dL (ref 8.4–10.5)
Chloride: 103 mEq/L (ref 96–112)
Creatinine, Ser: 0.72 mg/dL (ref 0.50–1.10)
Glucose, Bld: 100 mg/dL — ABNORMAL HIGH (ref 70–99)

## 2011-05-14 NOTE — Telephone Encounter (Signed)
Attempt to call Benjie Karvonen, son, back to address the paperwork and seeing Dr Patty Sermons while in the hospital. Dr Patty Sermons advises that social service can get the paperwork started and the hospitalist can help with facility placement. He also wanted him to know that if he wanted a cardiology consult whoever is covering for the [facility] could see her.

## 2011-05-14 NOTE — Telephone Encounter (Signed)
Patient fell and is at [facility].  Benjie Karvonen son, said they are trying to get pt in a facility.  Wanted dr. Patty Sermons to see in hosp and do patient facility paperwork.  Please call.

## 2011-05-15 LAB — IRON AND TIBC
TIBC: 203 ug/dL — ABNORMAL LOW (ref 250–470)
UIBC: 167 ug/dL

## 2011-05-15 LAB — BASIC METABOLIC PANEL
CO2: 20 mEq/L (ref 19–32)
Calcium: 9.3 mg/dL (ref 8.4–10.5)
Chloride: 102 mEq/L (ref 96–112)
Creatinine, Ser: 0.66 mg/dL (ref 0.50–1.10)
Glucose, Bld: 98 mg/dL (ref 70–99)

## 2011-05-15 LAB — VITAMIN B12: Vitamin B-12: 1898 pg/mL — ABNORMAL HIGH (ref 211–911)

## 2011-05-15 LAB — FERRITIN: Ferritin: 532 ng/mL — ABNORMAL HIGH (ref 10–291)

## 2011-05-16 ENCOUNTER — Ambulatory Visit: Payer: Medicare Other | Admitting: Cardiology

## 2011-05-20 NOTE — Discharge Summary (Signed)
[name], Tapia NO.:  [id number]  MEDICAL RECORD NO.:  [id number]  LOCATION:  1516                         FACILITY:  [facility]  PHYSICIAN:  Hillery Aldo, M.D.   DATE OF BIRTH:  04/12/1912  DATE OF ADMISSION:  05/12/2011 DATE OF DISCHARGE:  05/16/2011                        DISCHARGE SUMMARY - REFERRING   PRIMARY CARE PHYSICIAN:  None.  PRIMARY CARDIOLOGIST:  Dr. Patty Sermons.  DISCHARGE DIAGNOSES: 1. T8 compression fracture with minor contusion status post fall. 2. Acute renal failure, resolved. 3. Hypothyroidism. 4. Hypokalemia, resolved. 5. Hypertension. 6. Hyponatremia. 7. Normocytic anemia. 8. History of breast cancer. 9. History of hyperlipidemia. 10.History of coronary artery disease status post stent. 11.Mild hyponatremia.  DISCHARGE MEDICATIONS: 1. Calcitonin nasal spray, 1 spray intranasally daily. 2. Colace 100 mg p.o. b.i.d. 3. Aspirin 81 mg p.o. daily. 4. Micardis 18 mg p.o. daily. 5. Multivitamin 1 tablet p.o. daily. 6. Percocet 5/325 mg 1-2 tablets p.o. q.4 h. p.r.n. pain. 7. Synthroid 25 mcg p.o. daily. 8. Toprol-XL 50 mg p.o. daily.  CONSULTATIONS: 1. Telephone consultation made with Dr. Coletta Memos. 2. Dr. Orest Dikes of Interventional Radiology. 3. Physical therapy. 4. Occupational Therapy.  BRIEF ADMISSION HPI:  The patient is a 77 year old female who was brought to the hospital by family secondary to suffering back pain for 1 week.  The patient's pain was precipitated by a mechanical fall.  Upon initial evaluation, the patient was noted to have a 30% compression deformity of T8 and also a chronic compression deformity of L1 which was chronic.  The patient subsequently was referred to the hospitalist service for further evaluation and treatment.  For the full details, please see the dictated report done by Dr. Sharl Tapia.  PROCEDURES AND DIAGNOSTIC STUDIES: 1. MRI of the thoracic and lumbar spine performed on May 14, 2011,  showed subacute, approximately 60% compression fracture at T8     associated with mild osseous retropulsion.  Mild expansion of the     cord at the level of fracture suspicious for cord contusion.  No     evidence of cord hemorrhage and no other fracture demonstrated.     Healed superior endplate compression fracture at L1.  No evidence     of acute lumbar spine fracture.  Mild spondylosis for age without     high-grade spinal stenosis or nerve root encroachment.  DISCHARGE LABORATORY VALUES:  Iron was 36, total iron binding capacity 203, urine iron-binding capacity 167, vitamin B12 1898, serum folate greater than 20, ferritin 532.  Sodium was 133, potassium 3.9, chloride 102, bicarbonate 20, BUN 10, creatinine 0.66, glucose 98, and calcium 9.3.  TSH was 2.384.  Urine cultures were negative.  HOSPITAL COURSE BY PROBLEM: 1. T8 compression fracture/chronic L1 compression fracture:  The     patient was admitted and Interventional Radiology was consulted for     consideration of kyphoplasty.  Because the MRI showed possible cord     contusion, however, she was not felt to be a good kyphoplasty     candidate.  Accordingly, telephone consultation was made with Dr.     Coletta Memos who reviewed the patient's MRI findings and did not     feel that  there was significant cord contusion given the fact that     her neurologic exam was relatively normal with no complaints of     paresthesia and no loss of motor function to the lower extremities.     He felt that the patient could be managed conservatively with     bracing as tolerated and pain medications as well as progressive     physical therapy.  The ortho tech did attempt to fit the patient     with a brace but it was felt that this would be more uncomfortable     than helpful and therefore, recommended no further bracing.  The     patient's pain has improved with conservative treatment including     narcotic pain medicine.  She was also placed  on calcitonin for     presumed osteoporosis.  The patient does not have a primary care     physician and has been using Dr. Patty Sermons to fill her primary care     needs.  Accordingly, she will need to be setup with a primary care     physician post rehab so that her routine health maintenance needs     including a DEXA scan can be done.  She did receive a Pneumovax     while in the hospital for health maintenance.  At this point, the     plan is to discharge her to rehab with progressive ambulation as     tolerated and physical therapy. 2. Acute renal failure:  This appears to be prerenal.  The patient was     on an angiotensin receptive blocker, which was held and she was     gently hydrated.  Her discharge creatinine was back to normal     values at 0.66. 3. Hypothyroidism:  The patient was maintained on her usual dose of     Synthroid and her TSH was checked and found to be within normal     limits. 4. Hypokalemia:  The patient's potassium was fully repleted with     discharge potassium noted at 3.9. 5. Hypertension:  The patient was maintained on metoprolol.  Her     dosage was temporarily increased because her angiotensin receptive     blocker was on hold and her blood pressure rose without this.  At     this point, she can safely resume her prehospital regimen since her     renal function is back to normal. 6. Hyponatremia:  The patient's sodium is mildly low and has ranged     from 133-135.  No further workup was undertaken in the hospital as     this was not felt to be significant. 7. Anemia of chronic disease:  The patient was noted to have a     normocytic anemia and anemia panel confirms anemia of chronic     disease with no specific deficiencies identified given her high     ferritin.  The patient's other chronic medical problems were stable.  DISPOSITION:  The patient will be discharged to a skilled nursing facility for further inpatient rehabilitation.  DISCHARGE  INSTRUCTIONS:  Activity:  Increase activity slowly, walk with assistance with a walker.  Progressive ambulation with physical therapy.  Diet:  Low sodium heart-healthy.  FOLLOWUP:  With a primary care physician of your choice at the rehab and 1 week post discharge from the rehab.  Followup with Dr. Patty Sermons as needed.  CONDITION ON DISCHARGE:  Improved.  Time  spent coordinating care for discharge and discharge instructions including face-to-face time and speaking with the patient's nephew equals approximately 35 minutes.     Hillery Aldo, M.D.     CR/MEDQ  D:  05/16/2011  T:  05/16/2011  Job:  [id number]  cc:   Cassell Clement, M.D. Fax: [phone]  Electronically Signed by Hillery Aldo M.D. on 05/20/2011 07:10:50 AM

## 2011-07-03 ENCOUNTER — Other Ambulatory Visit: Payer: Self-pay | Admitting: Cardiology

## 2011-07-03 ENCOUNTER — Ambulatory Visit: Payer: Medicare Other | Admitting: Cardiology

## 2011-07-03 ENCOUNTER — Other Ambulatory Visit: Payer: Medicare Other | Admitting: *Deleted

## 2011-07-03 DIAGNOSIS — I119 Hypertensive heart disease without heart failure: Secondary | ICD-10-CM

## 2011-07-03 DIAGNOSIS — I2510 Atherosclerotic heart disease of native coronary artery without angina pectoris: Secondary | ICD-10-CM

## 2011-07-03 DIAGNOSIS — E7800 Pure hypercholesterolemia, unspecified: Secondary | ICD-10-CM

## 2011-07-03 LAB — BASIC METABOLIC PANEL
Chloride: 98
Creatinine, Ser: 0.97
GFR calc Af Amer: >60
GFR calc non Af Amer: 54 — ABNORMAL LOW

## 2011-07-03 LAB — CBC
MCV: 94.3
Platelets: 263
RBC: 4.01
WBC: 6.1

## 2011-07-03 LAB — DIFFERENTIAL
Eosinophils Absolute: 0.1
Lymphocytes Relative: 20
Lymphs Abs: 1.2
Monocytes Relative: 8
Neutro Abs: 4.2
Neutrophils Relative %: 69

## 2011-07-03 LAB — PROTIME-INR
INR: 0.9
Prothrombin Time: 12.6

## 2011-07-04 LAB — BASIC METABOLIC PANEL
Chloride: 100
GFR calc Af Amer: >60
GFR calc non Af Amer: 54 — ABNORMAL LOW
Potassium: 4.4
Sodium: 133 — ABNORMAL LOW

## 2011-07-04 LAB — CBC
HCT: 34 — ABNORMAL LOW
Hemoglobin: 11.7 — ABNORMAL LOW
MCV: 93.7
RBC: 3.63 — ABNORMAL LOW
WBC: 4.9

## 2011-08-13 DIAGNOSIS — E871 Hypo-osmolality and hyponatremia: Secondary | ICD-10-CM

## 2011-08-13 DIAGNOSIS — Z9181 History of falling: Secondary | ICD-10-CM

## 2011-08-13 DIAGNOSIS — D649 Anemia, unspecified: Secondary | ICD-10-CM

## 2011-08-13 DIAGNOSIS — C50919 Malignant neoplasm of unspecified site of unspecified female breast: Secondary | ICD-10-CM

## 2011-08-13 DIAGNOSIS — IMO0002 Reserved for concepts with insufficient information to code with codable children: Secondary | ICD-10-CM

## 2011-08-13 HISTORY — DX: Reserved for concepts with insufficient information to code with codable children: IMO0002

## 2011-08-13 HISTORY — DX: Malignant neoplasm of unspecified site of unspecified female breast: C50.919

## 2011-08-13 HISTORY — DX: Anemia, unspecified: D64.9

## 2011-08-13 HISTORY — DX: Hypo-osmolality and hyponatremia: E87.1

## 2011-08-13 HISTORY — DX: History of falling: Z91.81

## 2011-09-12 ENCOUNTER — Encounter: Payer: Self-pay | Admitting: Cardiology

## 2011-09-12 ENCOUNTER — Ambulatory Visit (INDEPENDENT_AMBULATORY_CARE_PROVIDER_SITE_OTHER): Payer: Medicare Other | Admitting: Cardiology

## 2011-09-12 DIAGNOSIS — I119 Hypertensive heart disease without heart failure: Secondary | ICD-10-CM

## 2011-09-12 DIAGNOSIS — I2510 Atherosclerotic heart disease of native coronary artery without angina pectoris: Secondary | ICD-10-CM

## 2011-09-12 DIAGNOSIS — D649 Anemia, unspecified: Secondary | ICD-10-CM

## 2011-09-12 DIAGNOSIS — E7800 Pure hypercholesterolemia, unspecified: Secondary | ICD-10-CM

## 2011-09-12 DIAGNOSIS — R634 Abnormal weight loss: Secondary | ICD-10-CM

## 2011-09-12 DIAGNOSIS — I959 Hypotension, unspecified: Secondary | ICD-10-CM

## 2011-09-12 DIAGNOSIS — E039 Hypothyroidism, unspecified: Secondary | ICD-10-CM

## 2011-09-12 NOTE — Assessment & Plan Note (Signed)
The patient has a history of hypertensive cardiovascular disease.  Her systolic pressures tend to be elevated.  Today she was given clonidine at the nursing home prior to coming to our office. At our office her blood pressure was very low.  I checked it and got 96/50 in the right arm.  The patient complains of slight dizziness.  We will adjust her orders so that she does not get clonidine unless her systolic pressure rises above 161.

## 2011-09-12 NOTE — Assessment & Plan Note (Signed)
The patient has a history of ischemic heart disease.  She had an angioplasty of her right coronary artery in 1995.  He has occasional chest pain and takes occasional nitroglycerin and we refilled her sublingual nitroglycerin for her today

## 2011-09-12 NOTE — Assessment & Plan Note (Signed)
The patient is too thin.  Her weight has dropped from 95 pounds to 90 pounds since we last saw her.  This is contributing to her weakness.  She is going to have to try to eat more calories

## 2011-09-12 NOTE — Patient Instructions (Signed)
ONLY GIVE CLONIDINE IF SYSTOLIC BLOOD PRESSURE ABOVE 161  Your physician recommends that you schedule a follow-up appointment in: 4 months with EKG/CBC/HFP/BMET

## 2011-09-12 NOTE — Assessment & Plan Note (Signed)
The patient is clinically euthyroid on her present dose of Levothroid 25 mcg daily

## 2011-09-12 NOTE — Progress Notes (Signed)
Kendra Tapia Date of Birth:  30-Oct-1911 [facility] / [facility] [address]   [address] [place], [place]  [address] [phone]           Fax   [phone]  History of Present Illness: This pleasant 77 year old woman is seen for a scheduled followup office visit.  Since we last saw her she had another fall and was hospitalized at [facility] 4 a depression fracture of the spine.  She was no longer able to live at home and her family moved her into an assisted living facility at [facility].  She has been there since 08/12/2011.  She is getting good care there.  She is still having to take moderate amount of pain medication for her back pain.  Current Outpatient Prescriptions  Medication Sig Dispense Refill  . acetaminophen (TYLENOL) 500 MG tablet Take 500 mg by mouth every 6 (six) hours as needed.        Marland Kitchen aspirin 81 MG tablet Take 81 mg by mouth daily.        . calcitonin, salmon, (MIACALCIN/FORTICAL) 200 UNIT/ACT nasal spray Place 1 spray into the nose daily.        . cloNIDine (CATAPRES) 0.1 MG tablet Take 0.1 mg by mouth every 4 (four) hours as needed.        . docusate sodium (COLACE) 100 MG capsule Take 100 mg by mouth 2 (two) times daily.        Marland Kitchen levothyroxine (LEVOTHROID) 25 MCG tablet Brand only  30 tablet  11  . losartan (COZAAR) 100 MG tablet Take 100 mg by mouth daily.        . metoprolol (TOPROL-XL) 50 MG 24 hr tablet Take 50 mg by mouth daily.        . Multiple Vitamins-Minerals (OCUVITE PO) Take by mouth daily.        . nitroGLYCERIN (NITROSTAT) 0.4 MG SL tablet Place 0.4 mg under the tongue every 5 (five) minutes as needed.        Marland Kitchen omeprazole (PRILOSEC) 20 MG capsule Take 20 mg by mouth daily.        Marland Kitchen oxyCODONE-acetaminophen (PERCOCET) 5-325 MG per tablet Take 1 tablet by mouth every 4 (four) hours as needed.        . Polyethylene Glycol 3350 (MIRALAX PO) Take by mouth daily.        . traMADol (ULTRAM) 50 MG tablet Take 50 mg by mouth 2 (two) times  daily. Maximum dose= 8 tablets per day         Allergies  Allergen Reactions  . Lipitor (Atorvastatin Calcium)     MUSCLE ACHES  . Micardis (Telmisartan)     Patient Active Problem List  Diagnoses  . Coronary artery disease  . Benign hypertensive heart disease without heart failure  . Hypothyroidism  . Hypercholesterolemia  . Weight loss, unintentional    History  Smoking status  . Former Smoker  . Quit date: 03/11/1981  Smokeless tobacco  . Not on file    History  Alcohol Use No    Family History  Problem Relation Age of Onset  . Heart disease Mother   . Heart attack Father     Review of Systems: Constitutional: no fever chills diaphoresis or fatigue or change in weight.  Head and neck: no hearing loss, no epistaxis, no photophobia or visual disturbance. Respiratory: No cough, shortness of breath or wheezing. Cardiovascular: No chest pain peripheral edema, palpitations. Gastrointestinal: No abdominal distention, no  abdominal pain, no change in bowel habits hematochezia or melena. Genitourinary: No dysuria, no frequency, no urgency, no nocturia. Musculoskeletal:No arthralgias, no back pain, no gait disturbance or myalgias. Neurological: No dizziness, no headaches, no numbness, no seizures, no syncope, no weakness, no tremors. Hematologic: No lymphadenopathy, no easy bruising. Psychiatric: No confusion, no hallucinations, no sleep disturbance.    Physical Exam: Filed Vitals:   09/12/11 1419  BP: 98/68  Pulse: 60   the general appearance reveals an elderly Caucasian female in no acute distress.Pupils equal and reactive.   Extraocular Movements are full.  There is no scleral icterus.  The mouth and pharynx are normal.  The neck is supple.  The carotids reveal no bruits.  The jugular venous pressure is normal.  The thyroid is not enlarged.  There is no lymphadenopathy.  The chest is clear to percussion and auscultation. There are no rales or rhonchi. Expansion of  the chest is symmetrical.  Heart reveals a soft systolic ejection murmur at the baseThe abdomen is soft and nontender. Bowel sounds are normal. The liver and spleen are not enlarged. There Are no abdominal masses. There are no bruits.  The pedal pulses are good.  There is no phlebitis or edema.  There is no cyanosis or clubbing. Strength is normal and symmetrical in all extremities.  There is no lateralizing weakness.  There are no sensory deficits.  The skin is warm and dry.  There is no rash.    Assessment / Plan: I encouraged her to increase her caloric intake to gain weight.  We will plan to see her back for a followup office visit and EKG and blood work in 4 months.  She has a past history of anemia and will check a CBC and a comprehensive metabolic panel

## 2011-09-24 DIAGNOSIS — R269 Unspecified abnormalities of gait and mobility: Secondary | ICD-10-CM

## 2011-09-24 DIAGNOSIS — H3530 Unspecified macular degeneration: Secondary | ICD-10-CM

## 2011-09-24 DIAGNOSIS — R351 Nocturia: Secondary | ICD-10-CM

## 2011-09-24 DIAGNOSIS — R413 Other amnesia: Secondary | ICD-10-CM

## 2011-09-24 DIAGNOSIS — M8448XA Pathological fracture, other site, initial encounter for fracture: Secondary | ICD-10-CM

## 2011-09-24 DIAGNOSIS — N289 Disorder of kidney and ureter, unspecified: Secondary | ICD-10-CM

## 2011-09-24 HISTORY — DX: Unspecified macular degeneration: H35.30

## 2011-09-24 HISTORY — DX: Unspecified abnormalities of gait and mobility: R26.9

## 2011-09-24 HISTORY — DX: Nocturia: R35.1

## 2011-09-24 HISTORY — DX: Pathological fracture, other site, initial encounter for fracture: M84.48XA

## 2011-09-24 HISTORY — DX: Disorder of kidney and ureter, unspecified: N28.9

## 2011-09-24 HISTORY — DX: Other amnesia: R41.3

## 2011-11-10 DIAGNOSIS — R634 Abnormal weight loss: Secondary | ICD-10-CM

## 2011-11-10 DIAGNOSIS — F329 Major depressive disorder, single episode, unspecified: Secondary | ICD-10-CM

## 2011-11-10 DIAGNOSIS — F3289 Other specified depressive episodes: Secondary | ICD-10-CM

## 2011-11-10 DIAGNOSIS — R5381 Other malaise: Secondary | ICD-10-CM

## 2011-11-10 HISTORY — DX: Other specified depressive episodes: F32.89

## 2011-11-10 HISTORY — DX: Major depressive disorder, single episode, unspecified: F32.9

## 2011-11-10 HISTORY — DX: Other malaise: R53.81

## 2011-11-10 HISTORY — DX: Abnormal weight loss: R63.4

## 2012-01-01 ENCOUNTER — Encounter: Payer: Self-pay | Admitting: Cardiology

## 2012-01-02 ENCOUNTER — Ambulatory Visit: Payer: Medicare Other | Admitting: Cardiology

## 2012-01-06 ENCOUNTER — Encounter: Payer: Self-pay | Admitting: Cardiology

## 2012-02-09 ENCOUNTER — Encounter: Payer: Self-pay | Admitting: Cardiology

## 2012-02-09 ENCOUNTER — Ambulatory Visit (INDEPENDENT_AMBULATORY_CARE_PROVIDER_SITE_OTHER): Payer: Medicare Other | Admitting: Cardiology

## 2012-02-09 VITALS — BP 128/70 | HR 70 | Ht 61.0 in | Wt 95.0 lb

## 2012-02-09 DIAGNOSIS — I2510 Atherosclerotic heart disease of native coronary artery without angina pectoris: Secondary | ICD-10-CM

## 2012-02-09 DIAGNOSIS — I119 Hypertensive heart disease without heart failure: Secondary | ICD-10-CM

## 2012-02-09 DIAGNOSIS — I359 Nonrheumatic aortic valve disorder, unspecified: Secondary | ICD-10-CM

## 2012-02-09 DIAGNOSIS — I350 Nonrheumatic aortic (valve) stenosis: Secondary | ICD-10-CM

## 2012-02-09 NOTE — Progress Notes (Signed)
Kendra Tapia Date of Birth:  1912-06-18 [facility] [address] [place], [place]  [address] [phone]         Fax   [phone]  History of Present Illness: This pleasant 78 year old woman is seen for a four-month followup office visit.  She has a history of ischemic heart disease and a history of essential hypertension.  She also has hypothyroidism and hypercholesterolemia.  Now lives in assisted living at [facility].  She no longer drives her car.  She has not been experiencing any prolonged chest discomfort.  She has occasional brief chest pain which does not require sublingual nitroglycerin.  Current Outpatient Prescriptions  Medication Sig Dispense Refill  . acetaminophen (TYLENOL) 500 MG tablet Take 500 mg by mouth every 6 (six) hours as needed.        Marland Kitchen aspirin 81 MG tablet Take 81 mg by mouth daily.        . calcitonin, salmon, (MIACALCIN/FORTICAL) 200 UNIT/ACT nasal spray Place 1 spray into the nose daily.        . cloNIDine (CATAPRES) 0.1 MG tablet Take 0.1 mg by mouth every 4 (four) hours as needed.        . docusate sodium (COLACE) 100 MG capsule Take 100 mg by mouth 2 (two) times daily.        Marland Kitchen levothyroxine (LEVOTHROID) 25 MCG tablet Brand only  30 tablet  11  . losartan (COZAAR) 100 MG tablet Take 100 mg by mouth daily.        . metoprolol (TOPROL-XL) 50 MG 24 hr tablet Take 50 mg by mouth daily.        . mirtazapine (REMERON) 7.5 MG tablet Take by mouth Daily.      . nitroGLYCERIN (NITROSTAT) 0.4 MG SL tablet Place 0.4 mg under the tongue every 5 (five) minutes as needed.        Bertram Gala Glycol-Propyl Glycol (SYSTANE OP) Apply to eye. As directed      . Polyethylene Glycol 3350 (MIRALAX PO) Take by mouth daily.        Marland Kitchen omeprazole (PRILOSEC) 20 MG capsule Take 20 mg by mouth daily.          Allergies  Allergen Reactions  . Lipitor (Atorvastatin Calcium)     MUSCLE ACHES  . Micardis (Telmisartan)   . Other     Adhesive  tape,chocolate,citrus,berriespaper tape    Patient Active Problem List  Diagnoses  . Coronary artery disease  . Benign hypertensive heart disease without heart failure  . Hypothyroidism  . Hypercholesterolemia  . Weight loss, unintentional  . Aortic stenosis    History  Smoking status  . Former Smoker  . Quit date: 03/11/1981  Smokeless tobacco  . Not on file    History  Alcohol Use No    Family History  Problem Relation Age of Onset  . Heart disease Mother   . Heart attack Father     Review of Systems: Constitutional: no fever chills diaphoresis or fatigue or change in weight.  Head and neck: no hearing loss, no epistaxis, no photophobia or visual disturbance. Respiratory: No cough, shortness of breath or wheezing. Cardiovascular: No chest pain peripheral edema, palpitations. Gastrointestinal: No abdominal distention, no abdominal pain, no change in bowel habits hematochezia or melena. Genitourinary: No dysuria, no frequency, no urgency, no nocturia. Musculoskeletal:No arthralgias, no back pain, no gait disturbance or myalgias. Neurological: No dizziness, no headaches, no numbness, no seizures, no syncope, no weakness, no tremors. Hematologic:  No lymphadenopathy, no easy bruising. Psychiatric: No confusion, no hallucinations, no sleep disturbance.    Physical Exam: Filed Vitals:   02/09/12 1501  BP: 128/70  Pulse: 70   the general appearance reveals an elderly woman who is alert.  She is in no distress.Pupils equal and reactive.   Extraocular Movements are full.  There is no scleral icterus.  The mouth and pharynx are normal.  The neck is supple.  The carotids reveal no bruits.  The jugular venous pressure is normal.  The thyroid is not enlarged.  There is no lymphadenopathy.  The chest is clear to percussion and auscultation. There are no rales or rhonchi. Expansion of the chest is symmetrical.  Heart reveals a grade 2/6 systolic ejection murmur at the base.The  abdomen is soft and nontender. Bowel sounds are normal. The liver and spleen are not enlarged. There Are no abdominal masses. There are no bruits.  The pedal pulses are good.  There is no phlebitis or edema.  There is no cyanosis or clubbing. Strength is normal and symmetrical in all extremities.  There is no lateralizing weakness.  There are no sensory deficits.  The skin is warm and dry.  There is no rash.  EKG today shows sinus bradycardia with first degree AV block and no ischemic changes.  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  Recheck in 4 months

## 2012-02-09 NOTE — Assessment & Plan Note (Signed)
The patient is not having any symptoms from her aortic stenosis.

## 2012-02-09 NOTE — Assessment & Plan Note (Signed)
No prolonged chest discomfort.

## 2012-02-09 NOTE — Assessment & Plan Note (Signed)
Blood pressure has been maintaining steady on current therapy.  She is no longer on Micardis because of the high price.  She is now on losartan

## 2012-02-09 NOTE — Patient Instructions (Signed)
Your physician recommends that you continue on your current medications as directed. Please refer to the Current Medication list given to you today.  Your physician recommends that you schedule a follow-up appointment in: 4 months  

## 2012-02-11 DIAGNOSIS — N393 Stress incontinence (female) (male): Secondary | ICD-10-CM

## 2012-02-11 DIAGNOSIS — N368 Other specified disorders of urethra: Secondary | ICD-10-CM

## 2012-02-11 HISTORY — DX: Other specified disorders of urethra: N36.8

## 2012-02-11 HISTORY — DX: Stress incontinence (female) (male): N39.3

## 2012-05-10 DIAGNOSIS — R32 Unspecified urinary incontinence: Secondary | ICD-10-CM

## 2012-05-10 DIAGNOSIS — B353 Tinea pedis: Secondary | ICD-10-CM

## 2012-05-10 DIAGNOSIS — K5900 Constipation, unspecified: Secondary | ICD-10-CM

## 2012-05-10 HISTORY — DX: Unspecified urinary incontinence: R32

## 2012-05-10 HISTORY — DX: Constipation, unspecified: K59.00

## 2012-05-10 HISTORY — DX: Tinea pedis: B35.3

## 2012-06-08 ENCOUNTER — Ambulatory Visit (INDEPENDENT_AMBULATORY_CARE_PROVIDER_SITE_OTHER): Payer: Medicare Other | Admitting: Cardiology

## 2012-06-08 ENCOUNTER — Encounter: Payer: Self-pay | Admitting: Cardiology

## 2012-06-08 VITALS — BP 132/78 | HR 65 | Ht 61.0 in | Wt 100.0 lb

## 2012-06-08 DIAGNOSIS — I2510 Atherosclerotic heart disease of native coronary artery without angina pectoris: Secondary | ICD-10-CM

## 2012-06-08 DIAGNOSIS — I350 Nonrheumatic aortic (valve) stenosis: Secondary | ICD-10-CM

## 2012-06-08 DIAGNOSIS — I359 Nonrheumatic aortic valve disorder, unspecified: Secondary | ICD-10-CM

## 2012-06-08 DIAGNOSIS — I119 Hypertensive heart disease without heart failure: Secondary | ICD-10-CM

## 2012-06-08 DIAGNOSIS — E039 Hypothyroidism, unspecified: Secondary | ICD-10-CM

## 2012-06-08 NOTE — Assessment & Plan Note (Signed)
The patient has not been experiencing any exertional dizziness or syncope.  No CHF.  No angina.

## 2012-06-08 NOTE — Patient Instructions (Addendum)
Your physician recommends that you continue on your current medications as directed. Please refer to the Current Medication list given to you today.  Your physician recommends that you schedule a follow-up appointment in: 4 month ov/ekg 

## 2012-06-08 NOTE — Assessment & Plan Note (Signed)
The patient is clinically euthyroid. 

## 2012-06-08 NOTE — Assessment & Plan Note (Signed)
The patient has not had any recurrent chest pain or angina pectoris.  She has not had to take any recent sublingual nitroglycerin

## 2012-06-08 NOTE — Progress Notes (Signed)
Clearnce Sorrel Date of Birth:  02-27-1912 [facility] [address] [place], [place]  [address] [phone]         Fax   [phone]  History of Present Illness: This pleasant 78 year old woman is seen for a four-month followup office visit. She has a history of ischemic heart disease and a history of essential hypertension. She also has hypothyroidism and hypercholesterolemia. Now lives in assisted living at [facility]. She no longer drives her car. She has not been experiencing any prolonged chest discomfort. She has occasional brief chest pain which does not require sublingual nitroglycerin.  She has a known murmur of mild aortic stenosis.   Current Outpatient Prescriptions  Medication Sig Dispense Refill  . acetaminophen (TYLENOL) 500 MG tablet Take 500 mg by mouth every 6 (six) hours as needed.        Marland Kitchen amoxicillin (AMOXIL) 500 MG capsule Take 4 capsules 1 hour prior to dental procedures      . aspirin 81 MG tablet Take 81 mg by mouth daily.        . calcitonin, salmon, (MIACALCIN/FORTICAL) 200 UNIT/ACT nasal spray Place 1 spray into the nose daily.        . cloNIDine (CATAPRES) 0.1 MG tablet Take 0.1 mg by mouth every 4 (four) hours as needed.        . clotrimazole (LOTRIMIN) 1 % cream Apply topically. To both feet daily for 21 days      . donepezil (ARICEPT) 5 MG tablet Take 1 tablet by mouth daily.      Marland Kitchen levothyroxine (LEVOTHROID) 25 MCG tablet Brand only  30 tablet  11  . losartan (COZAAR) 100 MG tablet Take 100 mg by mouth daily.        . metoprolol (LOPRESSOR) 50 MG tablet Take 1 tablet by mouth Twice daily.      . mirtazapine (REMERON) 7.5 MG tablet Take by mouth Daily.      . nitroGLYCERIN (NITROSTAT) 0.4 MG SL tablet Place 0.4 mg under the tongue every 5 (five) minutes as needed.        Marland Kitchen oxybutynin (DITROPAN-XL) 5 MG 24 hr tablet Take 1 tablet by mouth at bedtime.      Bertram Gala Glycol-Propyl Glycol (SYSTANE OP) Apply to eye. As directed      .  Polyethylene Glycol 3350 (MIRALAX PO) Take by mouth daily.        Marland Kitchen senna (SENOKOT) 8.6 MG tablet Take 2 tablets by mouth daily.        Allergies  Allergen Reactions  . Lipitor (Atorvastatin Calcium)     MUSCLE ACHES  . Micardis (Telmisartan)   . Other     Adhesive tape,chocolate,citrus,berriespaper tape    Patient Active Problem List  Diagnosis  . Coronary artery disease  . Benign hypertensive heart disease without heart failure  . Hypothyroidism  . Hypercholesterolemia  . Weight loss, unintentional  . Aortic stenosis    History  Smoking status  . Former Smoker  . Quit date: 03/11/1981  Smokeless tobacco  . Not on file    History  Alcohol Use No    Family History  Problem Relation Age of Onset  . Heart disease Mother   . Heart attack Father     Review of Systems: Constitutional: no fever chills diaphoresis or fatigue or change in weight.  Head and neck: no hearing loss, no epistaxis, no photophobia or visual disturbance. Respiratory: No cough, shortness of breath or wheezing. Cardiovascular: No chest  pain peripheral edema, palpitations. Gastrointestinal: No abdominal distention, no abdominal pain, no change in bowel habits hematochezia or melena. Genitourinary: No dysuria, no frequency, no urgency, no nocturia. Musculoskeletal:No arthralgias, no back pain, no gait disturbance or myalgias. Neurological: No dizziness, no headaches, no numbness, no seizures, no syncope, no weakness, no tremors. Hematologic: No lymphadenopathy, no easy bruising. Psychiatric: No confusion, no hallucinations, no sleep disturbance.    Physical Exam: Filed Vitals:   06/08/12 1514  BP: 132/78  Pulse: 65   the general appearance is that of an elderly alert woman in no distress.The head and neck exam reveals pupils equal and reactive.  Extraocular movements are full.  There is no scleral icterus.  The mouth and pharynx are normal.  The neck is supple.  The carotids reveal no bruits.   The jugular venous pressure is normal.  The  thyroid is not enlarged.  There is no lymphadenopathy.  The chest is clear to percussion and auscultation.  There are no rales or rhonchi.  Expansion of the chest is symmetrical.  The precordium is quiet.  The first heart sound is normal.  The second heart sound is physiologically split.  There is no  gallop rub or click.  There is a grade 2/6 systolic ejection murmur heard at the base.  No diastolic murmur.  There is no abnormal lift or heave.  The abdomen is soft and nontender.  The bowel sounds are normal.  The liver and spleen are not enlarged.  There are no abdominal masses.  There are no abdominal bruits.  Extremities reveal good pedal pulses.  There is no phlebitis or edema.  There is no cyanosis or clubbing.  Strength is normal and symmetrical in all extremities.  There is no lateralizing weakness.  There are no sensory deficits.  The skin is warm and dry.  There is no rash.     Assessment / Plan: The patient is to continue same medication.  Recheck in 4 months for followup office visit and EKG.  I told her not to worry about gaining weight.  Previously she was too thin.

## 2012-06-08 NOTE — Assessment & Plan Note (Signed)
Her blood pressure has been remaining stable on current medication.  Her weight is up 5 pounds but this appears to be secondary to eating better and not to fluid accumulation

## 2012-07-28 DIAGNOSIS — H04129 Dry eye syndrome of unspecified lacrimal gland: Secondary | ICD-10-CM

## 2012-07-28 DIAGNOSIS — H353290 Exudative age-related macular degeneration, unspecified eye, stage unspecified: Secondary | ICD-10-CM

## 2012-07-28 HISTORY — DX: Dry eye syndrome of unspecified lacrimal gland: H04.129

## 2012-07-28 HISTORY — DX: Exudative age-related macular degeneration, unspecified eye, stage unspecified: H35.3290

## 2012-10-11 ENCOUNTER — Encounter: Payer: Self-pay | Admitting: Cardiology

## 2012-10-11 ENCOUNTER — Ambulatory Visit (INDEPENDENT_AMBULATORY_CARE_PROVIDER_SITE_OTHER): Payer: Medicare Other | Admitting: Cardiology

## 2012-10-11 VITALS — BP 130/70 | HR 59 | Resp 18 | Ht <= 58 in | Wt 102.0 lb

## 2012-10-11 DIAGNOSIS — I350 Nonrheumatic aortic (valve) stenosis: Secondary | ICD-10-CM

## 2012-10-11 DIAGNOSIS — E039 Hypothyroidism, unspecified: Secondary | ICD-10-CM

## 2012-10-11 DIAGNOSIS — R634 Abnormal weight loss: Secondary | ICD-10-CM

## 2012-10-11 DIAGNOSIS — I359 Nonrheumatic aortic valve disorder, unspecified: Secondary | ICD-10-CM

## 2012-10-11 DIAGNOSIS — I119 Hypertensive heart disease without heart failure: Secondary | ICD-10-CM

## 2012-10-11 NOTE — Patient Instructions (Addendum)
Your physician recommends that you continue on your current medications as directed. Please refer to the Current Medication list given to you today.  Your physician wants you to follow-up in: 4 months with fasting labs (lp/bmet/hfp/cbc)  You will receive a reminder letter in the mail two months in advance. If you don't receive a letter, please call our office to schedule the follow-up appointment.  

## 2012-10-11 NOTE — Assessment & Plan Note (Signed)
Her weight loss appears to be stabilizing.  We have encouraged her to eat.  She was worried that she would be getting a paunch.

## 2012-10-11 NOTE — Assessment & Plan Note (Signed)
No symptoms of headaches or dizziness.  No CHF symptoms

## 2012-10-11 NOTE — Assessment & Plan Note (Signed)
The patient is clinically euthyroid on current therapy 

## 2012-10-11 NOTE — Progress Notes (Signed)
Kendra Tapia Date of Birth:  [birth year] [facility] [address] [place], [place]  [address] [phone]         Fax   [phone]  History of Present Illness: This pleasant 78 year old woman is seen for a four-month followup office visit. She has a history of ischemic heart disease and a history of essential hypertension. She also has hypothyroidism and hypercholesterolemia. Now lives in assisted living at [facility]. She no longer drives her car. She has not been experiencing any prolonged chest discomfort. She has occasional brief chest pain which does not require sublingual nitroglycerin. She has a known murmur of mild aortic stenosis.  Since last visit she has been doing well with no new cardiac complaints.   Current Outpatient Prescriptions  Medication Sig Dispense Refill  . acetaminophen (TYLENOL) 500 MG tablet Take 500 mg by mouth every 6 (six) hours as needed.        Marland Kitchen amoxicillin (AMOXIL) 500 MG capsule Take 4 capsules 1 hour prior to dental procedures      . aspirin 81 MG tablet Take 81 mg by mouth daily.        . calcitonin, salmon, (MIACALCIN/FORTICAL) 200 UNIT/ACT nasal spray Place 1 spray into the nose daily.        . cloNIDine (CATAPRES) 0.1 MG tablet Take 0.1 mg by mouth every 4 (four) hours as needed.        . clotrimazole (LOTRIMIN) 1 % cream Apply topically. To both feet daily for 21 days      . donepezil (ARICEPT) 5 MG tablet Take 1 tablet by mouth daily.      Marland Kitchen levothyroxine (LEVOTHROID) 25 MCG tablet Brand only  30 tablet  11  . losartan (COZAAR) 100 MG tablet Take 100 mg by mouth daily.        . metoprolol (LOPRESSOR) 50 MG tablet Take 1 tablet by mouth Twice daily.      . mirtazapine (REMERON) 7.5 MG tablet Take by mouth Daily.      . nitroGLYCERIN (NITROSTAT) 0.4 MG SL tablet Place 0.4 mg under the tongue every 5 (five) minutes as needed.        Marland Kitchen oxybutynin (DITROPAN-XL) 5 MG 24 hr tablet Take 1 tablet by mouth at bedtime.      Bertram Gala Glycol-Propyl Glycol (SYSTANE OP) Apply to eye. As directed      . Polyethylene Glycol 3350 (MIRALAX PO) Take by mouth daily.        Marland Kitchen senna (SENOKOT) 8.6 MG tablet Take 2 tablets by mouth daily.        Allergies  Allergen Reactions  . Lipitor (Atorvastatin Calcium)     MUSCLE ACHES  . Micardis (Telmisartan)   . Other     Adhesive tape,chocolate,citrus,berriespaper tape    Patient Active Problem List  Diagnosis  . Coronary artery disease  . Benign hypertensive heart disease without heart failure  . Hypothyroidism  . Hypercholesterolemia  . Weight loss, unintentional  . Aortic stenosis    History  Smoking status  . Former Smoker  . Quit date: 03/11/1981  Smokeless tobacco  . Not on file    History  Alcohol Use No    Family History  Problem Relation Age of Onset  . Heart disease Mother   . Heart attack Father     Review of Systems: Constitutional: no fever chills diaphoresis or fatigue or change in weight.  Head and neck: no hearing loss, no epistaxis, no photophobia or  visual disturbance. Respiratory: No cough, shortness of breath or wheezing. Cardiovascular: No chest pain peripheral edema, palpitations. Gastrointestinal: No abdominal distention, no abdominal pain, no change in bowel habits hematochezia or melena. Genitourinary: No dysuria, no frequency, no urgency, no nocturia. Musculoskeletal:No arthralgias, no back pain, no gait disturbance or myalgias. Neurological: No dizziness, no headaches, no numbness, no seizures, no syncope, no weakness, no tremors. Hematologic: No lymphadenopathy, no easy bruising. Psychiatric: No confusion, no hallucinations, no sleep disturbance.    Physical Exam: Filed Vitals:   10/11/12 1520  BP: 130/70  Pulse: 59  Resp: 18   general appearance reveals a well-developed well-nourished woman in no distress.  She is mentally alert.The head and neck exam reveals pupils equal and reactive.  Extraocular movements are  full.  There is no scleral icterus.  The mouth and pharynx are normal.  The neck is supple.  The carotids reveal no bruits.  The jugular venous pressure is normal.  The  thyroid is not enlarged.  There is no lymphadenopathy.  The chest is clear to percussion and auscultation.  There are no rales or rhonchi.  Expansion of the chest is symmetrical.  The precordium is quiet.  The first heart sound is normal.  The second heart sound is physiologically split.  There is a grade 2/6 systolic murmur of aortic stenosis at the base  There is no abnormal lift or heave.  The abdomen is soft and nontender.  The bowel sounds are normal.  The liver and spleen are not enlarged.  There are no abdominal masses.  There are no abdominal bruits.  Extremities reveal good pedal pulses.  There is no phlebitis or edema.  There is no cyanosis or clubbing.  Strength is normal and symmetrical in all extremities.  There is no lateralizing weakness.  There are no sensory deficits.  The skin is warm and dry.  There is no rash.   EKG today shows normal sinus rhythm with first degree AV block and with left anterior hemiblock  Assessment / Plan: Continue same medication.  Recheck in 4 months for followup office visit CBC lipid panel hepatic function panel and basal metabolic panel

## 2012-11-16 ENCOUNTER — Telehealth: Payer: Self-pay | Admitting: Cardiology

## 2012-11-16 NOTE — Telephone Encounter (Signed)
New problem:  appt for lab on  4/ 7.   Need an order to draw labs at [facility] verse patient coming out twice.  Office will be faxing over the results.

## 2012-11-16 NOTE — Telephone Encounter (Signed)
Will fax over order as requested

## 2012-11-17 ENCOUNTER — Telehealth: Payer: Self-pay | Admitting: Cardiology

## 2012-11-17 NOTE — Telephone Encounter (Signed)
Will fax, number given yesterday was wrong number

## 2012-11-17 NOTE — Telephone Encounter (Signed)
Follow-up:    Patient called in requesting that you use this fax#[phone].

## 2013-01-11 ENCOUNTER — Encounter: Payer: Self-pay | Admitting: Cardiology

## 2013-01-17 ENCOUNTER — Other Ambulatory Visit: Payer: Medicare Other

## 2013-01-20 ENCOUNTER — Encounter: Payer: Self-pay | Admitting: Cardiology

## 2013-01-20 ENCOUNTER — Ambulatory Visit (INDEPENDENT_AMBULATORY_CARE_PROVIDER_SITE_OTHER): Payer: Medicare Other | Admitting: Cardiology

## 2013-01-20 VITALS — BP 118/64 | HR 60 | Ht 60.0 in | Wt 101.4 lb

## 2013-01-20 DIAGNOSIS — E039 Hypothyroidism, unspecified: Secondary | ICD-10-CM

## 2013-01-20 DIAGNOSIS — I350 Nonrheumatic aortic (valve) stenosis: Secondary | ICD-10-CM

## 2013-01-20 DIAGNOSIS — R634 Abnormal weight loss: Secondary | ICD-10-CM

## 2013-01-20 DIAGNOSIS — I359 Nonrheumatic aortic valve disorder, unspecified: Secondary | ICD-10-CM

## 2013-01-20 NOTE — Assessment & Plan Note (Signed)
The patient is clinically euthyroid. 

## 2013-01-20 NOTE — Progress Notes (Signed)
Kendra Tapia Date of Birth:  10-Feb-1912 [facility] [address] [place], [place]  [address] [phone]         Fax   [phone]  History of Present Illness: This pleasant 79 year old woman is seen for a four-month followup office visit. She has a history of ischemic heart disease and a history of aortic stenosis and a history of essential hypertension. She also has hypothyroidism and hypercholesterolemia. Now lives in assisted living at [facility]. She no longer drives her car. She has not been experiencing any prolonged chest discomfort. She has occasional brief chest pain which does not require sublingual nitroglycerin. She has a known murmur of mild aortic stenosis. Since last visit she has been doing well with no new cardiac complaints.  She does complain about the food and attributes her 1 pound weight loss to the fact that she doesn't like the food at [facility].   Current Outpatient Prescriptions  Medication Sig Dispense Refill  . acetaminophen (TYLENOL) 325 MG tablet Take 650 mg by mouth every 6 (six) hours as needed for pain.      Marland Kitchen acetaminophen (TYLENOL) 500 MG tablet Take 500 mg by mouth every 6 (six) hours as needed.        Marland Kitchen amoxicillin (AMOXIL) 500 MG capsule Take 4 capsules 1 hour prior to dental procedures      . aspirin 81 MG tablet Take 81 mg by mouth daily.        . calcitonin, salmon, (MIACALCIN/FORTICAL) 200 UNIT/ACT nasal spray Place 1 spray into the nose daily.        . cloNIDine (CATAPRES) 0.1 MG tablet Take 0.1 mg by mouth every 4 (four) hours as needed.        Marland Kitchen levothyroxine (LEVOTHROID) 25 MCG tablet Brand only  30 tablet  11  . losartan (COZAAR) 100 MG tablet Take 100 mg by mouth daily.        . metoprolol (LOPRESSOR) 50 MG tablet Take 1 tablet by mouth Twice daily.      . mirabegron ER (MYRBETRIQ) 50 MG TB24 Take 50 mg by mouth daily.      . mirtazapine (REMERON) 7.5 MG tablet Take by mouth Daily.      . nitroGLYCERIN (NITROSTAT)  0.4 MG SL tablet Place 0.4 mg under the tongue every 5 (five) minutes as needed.        Bertram Gala Glycol-Propyl Glycol (SYSTANE OP) Apply to eye. As directed      . Polyethylene Glycol 3350 (MIRALAX PO) Take by mouth daily.        Marland Kitchen senna (SENOKOT) 8.6 MG tablet Take 2 tablets by mouth daily.      Marland Kitchen triamcinolone cream (KENALOG) 0.1 % Apply topically every morning.       No current facility-administered medications for this visit.    Allergies  Allergen Reactions  . Lipitor (Atorvastatin Calcium)     MUSCLE ACHES  . Micardis (Telmisartan)   . Other     Adhesive tape,chocolate,citrus,berriespaper tape    Patient Active Problem List  Diagnosis  . Coronary artery disease  . Benign hypertensive heart disease without heart failure  . Hypothyroidism  . Hypercholesterolemia  . Weight loss, unintentional  . Aortic stenosis    History  Smoking status  . Former Smoker  . Quit date: 03/11/1981  Smokeless tobacco  . Not on file    History  Alcohol Use No    Family History  Problem Relation Age of Onset  .  Heart disease Mother   . Heart attack Father     Review of Systems: Constitutional: no fever chills diaphoresis or fatigue or change in weight.  Head and neck: no hearing loss, no epistaxis, no photophobia or visual disturbance. Respiratory: No cough, shortness of breath or wheezing. Cardiovascular: No chest pain peripheral edema, palpitations. Gastrointestinal: No abdominal distention, no abdominal pain, no change in bowel habits hematochezia or melena. Genitourinary: No dysuria, no frequency, no urgency, no nocturia. Musculoskeletal:No arthralgias, no back pain, no gait disturbance or myalgias. Neurological: No dizziness, no headaches, no numbness, no seizures, no syncope, no weakness, no tremors. Hematologic: No lymphadenopathy, no easy bruising. Psychiatric: No confusion, no hallucinations, no sleep disturbance.    Physical Exam: Filed Vitals:   01/20/13 1442    BP: 118/64  Pulse: 60   general appearance reveals a well-developed well-nourished elderly woman who is in a wheelchair..The head and neck exam reveals pupils equal and reactive.  Extraocular movements are full.  There is no scleral icterus.  The mouth and pharynx are normal.  The neck is supple.  The carotids reveal no bruits.  The jugular venous pressure is normal.  The  thyroid is not enlarged.  There is no lymphadenopathy.  The chest is clear to percussion and auscultation.  There are no rales or rhonchi.  Expansion of the chest is symmetrical.  The precordium is quiet.  The first heart sound is normal.  The second heart sound is physiologically split.  There is grade 3/6 harsh systolic ejection murmur at the aortic area radiating to the neck.  There is no abnormal lift or heave.  The abdomen is soft and nontender.  The bowel sounds are normal.  The liver and spleen are not enlarged.  There are no abdominal masses.  There are no abdominal bruits.  Extremities reveal good pedal pulses.  There is no phlebitis or edema.  There is no cyanosis or clubbing.  Strength is normal and symmetrical in all extremities.  There is no lateralizing weakness.  There are no sensory deficits.  The skin is warm and dry.  There is no rash.    Assessment / Plan: Continue on same medication.  Recheck in 4 months for followup office visit and EKG.  Her heart murmur appears to be somewhat more pronounced today.

## 2013-01-20 NOTE — Assessment & Plan Note (Signed)
The patient has a past history of mild aortic stenosis.  We do not have any echo results in Epic.  Her murmur is loud her today but she is not an operative candidate because of her age.  She is not having any symptoms of angina pectoris or exertional syncope or exacerbation of CHF

## 2013-01-20 NOTE — Assessment & Plan Note (Signed)
We have encouraged her to try to need more food to keep from dropping lower in her weight.

## 2013-01-20 NOTE — Patient Instructions (Addendum)
Your physician recommends that you continue on your current medications as directed. Please refer to the Current Medication list given to you today.  Your physician wants you to follow-up in: 4 month ov/ekg  You will receive a reminder letter in the mail two months in advance. If you don't receive a letter, please call our office to schedule the follow-up appointment.  

## 2013-02-23 ENCOUNTER — Encounter: Payer: Self-pay | Admitting: Geriatric Medicine

## 2013-02-23 ENCOUNTER — Non-Acute Institutional Stay: Payer: Medicare Other | Admitting: Geriatric Medicine

## 2013-02-23 VITALS — BP 160/78 | HR 72 | Temp 97.4°F | Ht <= 58 in | Wt 104.0 lb

## 2013-02-23 DIAGNOSIS — Z299 Encounter for prophylactic measures, unspecified: Secondary | ICD-10-CM

## 2013-02-23 DIAGNOSIS — R413 Other amnesia: Secondary | ICD-10-CM

## 2013-02-23 DIAGNOSIS — R32 Unspecified urinary incontinence: Secondary | ICD-10-CM

## 2013-02-23 DIAGNOSIS — E039 Hypothyroidism, unspecified: Secondary | ICD-10-CM

## 2013-02-23 DIAGNOSIS — H3530 Unspecified macular degeneration: Secondary | ICD-10-CM

## 2013-02-23 DIAGNOSIS — I10 Essential (primary) hypertension: Secondary | ICD-10-CM

## 2013-02-23 DIAGNOSIS — R269 Unspecified abnormalities of gait and mobility: Secondary | ICD-10-CM

## 2013-02-23 NOTE — Progress Notes (Deleted)
Date: 02/23/2013  MRN:  [id number] Name:  Kendra Tapia Sex:  female Age:  79 y.o. DOB:July 14, 1912   PSC #:                       Facility/Room; Level Of Care: Provider:   Emergency Contacts: Contact Information   Name Relation Home Work Mobile   [name],[name] Relative [phone]        Code Status: MOST Form:  Allergies: Allergies  Allergen Reactions  . Lipitor (Atorvastatin Calcium)     MUSCLE ACHES  . Micardis (Telmisartan)   . Other     Adhesive tape,chocolate,citrus,berriespaper tape     Chief Complaint  Patient presents with  . Annual Exam  . Medical Managment of Chronic Issues    thyroid, depression, blood pressure, memory, urine incontinence, gait     HPI:  Past Medical History  Diagnosis Date  . IHD (ischemic heart disease)   . Hypertension   . Nausea   . Coronary artery disease   . Hypothyroidism   . Hyperlipidemia   . Dizziness   . Exudative senile macular degeneration of retina 07/28/2012  . Tear film insufficiency, unspecified 07/28/2012  . Dermatophytosis of foot 05/10/2012  . Unspecified constipation 05/10/2012  . Unspecified urinary incontinence 05/10/2012  . Urethrocele 02/11/2012  . Female stress incontinence 02/11/2012  . Depressive disorder, not elsewhere classified 11/10/2011  . Other malaise and fatigue 11/10/2011  . Loss of weight 11/10/2011  . Macular degeneration (senile) of retina, unspecified 09/24/2011  . Unspecified disorder of kidney and ureter 09/24/2011  . Pathologic fracture of vertebrae 09/24/2011  . Memory loss 09/24/2011  . Abnormality of gait 09/24/2011  . Nocturia 09/24/2011  . Malignant neoplasm of breast (female), unspecified site 08/13/2011  . Hyposmolality and/or hyponatremia 08/13/2011  . Anemia, unspecified 08/13/2011  . Closed fracture of unspecified part of vertebral column without mention of spinal cord injury 08/13/2011  . Personal history of fall 08/13/2011    Past Surgical History  Procedure  Laterality Date  . Coronary angioplasty  03/23/94    NORMAL LEFT VENTRICULAR SYSTOLIC FUNCTION, EF 74%  . Av fistula repair    . Hip surgery  10/07/2010    Dr. Annell Greening  left   . Coronary angioplasty with stent placement      RIGHT CORONARY ARTERY  . Mastectomy      left      Procedures: Consultants:  Current Outpatient Prescriptions  Medication Sig Dispense Refill  . acetaminophen (TYLENOL) 325 MG tablet Take 650 mg by mouth every 6 (six) hours as needed for pain.      Marland Kitchen acetaminophen (TYLENOL) 500 MG tablet Take 500 mg by mouth every 6 (six) hours as needed.        Marland Kitchen amoxicillin (AMOXIL) 500 MG capsule Take 4 capsules 1 hour prior to dental procedures      . aspirin 81 MG tablet Take 81 mg by mouth daily.        . calcitonin, salmon, (MIACALCIN/FORTICAL) 200 UNIT/ACT nasal spray Place 1 spray into the nose daily.        . cetaphil (CETAPHIL) lotion Use as directed      . cloNIDine (CATAPRES) 0.1 MG tablet Take 0.1 mg by mouth every 4 (four) hours as needed.        Marland Kitchen levothyroxine (LEVOTHROID) 25 MCG tablet Brand only  30 tablet  11  . losartan (COZAAR) 100 MG tablet Take 50 mg by mouth daily.       Marland Kitchen  metoprolol (LOPRESSOR) 50 MG tablet Take 1 tablet by mouth Twice daily.      . mirabegron ER (MYRBETRIQ) 50 MG TB24 Take 50 mg by mouth daily.      . mirtazapine (REMERON) 7.5 MG tablet Take by mouth Daily.      . Multiple Vitamin (MULTIVITAMIN) tablet Take 1 tablet by mouth daily.      . Multiple Vitamins-Minerals (PRESERVISION AREDS PO) Take 1 capsule by mouth. Twice a day      . nitroGLYCERIN (NITROSTAT) 0.4 MG SL tablet Place 0.4 mg under the tongue every 5 (five) minutes as needed.        Bertram Gala Glycol-Propyl Glycol (SYSTANE OP) Apply to eye. As directed      . Polyethylene Glycol 3350 (MIRALAX PO) Take by mouth daily.        Marland Kitchen senna (SENOKOT) 8.6 MG tablet Take 2 tablets by mouth daily.      Marland Kitchen triamcinolone cream (KENALOG) 0.1 % Apply topically every morning.       No  current facility-administered medications for this visit.    Immunization History  Administered Date(s) Administered  . Influenza Whole 07/13/2012  . Pneumococcal Polysaccharide 10/13/2010     Diet:  History  Substance Use Topics  . Smoking status: Former Smoker    Quit date: 03/11/1981  . Smokeless tobacco: Never Used  . Alcohol Use: No    Family History  Problem Relation Age of Onset  . Heart disease Mother   . Heart attack Father   . Cancer Sister   . Cancer Brother      {Ros - complete:30496}  Vital signs: BP 160/78  Pulse 72  Temp(Src) 97.4 F (36.3 C) (Oral)  Ht 4\' 10"  (1.473 m)  Wt 104 lb (47.174 kg)  BMI 21.74 kg/m2  {exam, Complete:18323}  Screening Score  MMS    PHQ2    PHQ9     Fall Risk    BIMS    Annual summary: Hospitalizations:  Problem List as of 02/23/2013   Coronary artery disease   Last Assessment & Plan   06/08/2012 Office Visit Written 06/08/2012  4:46 PM by Cassell Clement, MD     The patient has not had any recurrent chest pain or angina pectoris.  She has not had to take any recent sublingual nitroglycerin    Benign hypertensive heart disease without heart failure   Last Assessment & Plan   10/11/2012 Office Visit Written 10/11/2012  6:01 PM by Cassell Clement, MD     No symptoms of headaches or dizziness.  No CHF symptoms    Hypothyroidism   Last Assessment & Plan   01/20/2013 Office Visit Written 01/20/2013  6:24 PM by Cassell Clement, MD     The patient is clinically euthyroid.    Hypercholesterolemia   Weight loss, unintentional   Last Assessment & Plan   01/20/2013 Office Visit Written 01/20/2013  6:23 PM by Cassell Clement, MD     We have encouraged her to try to need more food to keep from dropping lower in her weight.    Aortic stenosis   Last Assessment & Plan   01/20/2013 Office Visit Written 01/20/2013  6:21 PM by Cassell Clement, MD     The patient has a past history of mild aortic stenosis.  We do not have any  echo results in Epic.  Her murmur is loud her today but she is not an operative candidate because of her age.  She is not having  any symptoms of angina pectoris or exertional syncope or exacerbation of CHF      Infection History:  Functional assessment: Areas of potential improvement: Rehabilitation Potential: Prognosis for survival: Plan: ***

## 2013-02-23 NOTE — Progress Notes (Signed)
Patient ID: Kendra Tapia, female   DOB: 1912-05-09, 79 y.o.   MRN: [id number] [facility] [phone])  Chief Complaint  Patient presents with  . Annual Exam  . Medical Managment of Chronic Issues    thyroid, depression, blood pressure, memory, urine incontinence, gait    HPI: This is a 79 y.o.female residentident of [facility],  Assisted Living section. This patient has not had a hospitalization, serious illness or injury in the last year. Patient has not demonstrated significant decline in functional or cognitive status. She does continue to have mild memory loss, requires assistance with bathing and bathing and dressing. Urinary incontinence is not any worse; continues to have nocturia one to 2 times a night. Blood pressure has remained stable. Vision has been stable, she continues to follow closely with her ophthalmologist. Patient reports she's very comfortable in her current living situation, participates in available activities both in and out of the facility. Socializes with other residents especially in the dining room.  Bathing: Moderate Assist,  Bladder Management: Wears Pads manages independently, Bowel Management: Continent Feeding: Independent,  Hygiene and Grooming: Independent,  Lower Extremity Dressing: Minimal Assist, Upper Extremity Dressing: Minimal Assist,  Walk: Independent  Allergies  Allergies  Allergen Reactions  . Lipitor (Atorvastatin Calcium)     MUSCLE ACHES  . Micardis (Telmisartan)   . Other     Adhesive tape,chocolate,citrus,berriespaper tape   Medications Current outpatient prescriptions:acetaminophen (TYLENOL) 325 MG tablet, Take 650 mg by mouth every 6 (six) hours as needed for pain., Disp: , Rfl: ;  acetaminophen (TYLENOL) 500 MG tablet, Take 500 mg by mouth every 6 (six) hours as needed.  , Disp: , Rfl: ;  amoxicillin (AMOXIL) 500 MG capsule, Take 4 capsules 1 hour prior to dental procedures, Disp: , Rfl: ;  aspirin  81 MG tablet, Take 81 mg by mouth daily.  , Disp: , Rfl:  calcitonin, salmon, (MIACALCIN/FORTICAL) 200 UNIT/ACT nasal spray, Place 1 spray into the nose daily.  , Disp: , Rfl: ;  cetaphil (CETAPHIL) lotion, Use as directed, Disp: , Rfl: ;  cloNIDine (CATAPRES) 0.1 MG tablet, Take 0.1 mg by mouth every 4 (four) hours as needed.  , Disp: , Rfl: ;  levothyroxine (LEVOTHROID) 25 MCG tablet, Brand only, Disp: 30 tablet, Rfl: 11;  losartan (COZAAR) 100 MG tablet, Take 50 mg by mouth daily. , Disp: , Rfl:  metoprolol (LOPRESSOR) 50 MG tablet, Take 1 tablet by mouth Twice daily., Disp: , Rfl: ;  mirabegron ER (MYRBETRIQ) 50 MG TB24, Take 50 mg by mouth daily., Disp: , Rfl: ;  mirtazapine (REMERON) 7.5 MG tablet, Take by mouth Daily., Disp: , Rfl: ;  Multiple Vitamin (MULTIVITAMIN) tablet, Take 1 tablet by mouth daily., Disp: , Rfl: ;  Multiple Vitamins-Minerals (PRESERVISION AREDS PO), Take 1 capsule by mouth. Twice a day, Disp: , Rfl:  nitroGLYCERIN (NITROSTAT) 0.4 MG SL tablet, Place 0.4 mg under the tongue every 5 (five) minutes as needed.  , Disp: , Rfl: ;  Polyethyl Glycol-Propyl Glycol (SYSTANE OP), Apply to eye. As directed, Disp: , Rfl: ;  Polyethylene Glycol 3350 (MIRALAX PO), Take by mouth daily.  , Disp: , Rfl: ;  senna (SENOKOT) 8.6 MG tablet, Take 2 tablets by mouth daily., Disp: , Rfl:  triamcinolone cream (KENALOG) 0.1 %, Apply topically every morning., Disp: , Rfl:   Data Reviewed    Radiology:   [id number], external 12/13/20112 CMP: Glucose 92, BUN 27, Creatinine 0.94, Sodium 132, Potassium 5.1 CBC:  Rbc 3.56, Hgb 11.4, Hct 34.1 TSH 7.085 Vitamin B12  795  01/01/2012 CMP: glucose 92, BUN 26, Creatinine 1.05, Sodium 137, Potassium 4.9 CBC: Rbc 3.60, Hgb 11.2, Hct 34.5 Lipid: cholesterol 149, triglyceride 86, HDL 47, LDL 85  TSH 6.061 9/52/8413 TSH 5.038  12/07/2012: TSH 3.34 01/11/2013 : WBC 4.4, hemoglobin 10.9, hematocrit 32.5, platelets 223   glucose 74, BUN 20, creatinine 1.07, sodium  136, potassium 4.6 protein/LFTs WNL  Total cholesterol 176, triglycerides 52, HDL 53, LDL 114     PMH  Past Medical History  Diagnosis Date  . IHD (ischemic heart disease)   . Hypertension   . Nausea   . Coronary artery disease   . Hypothyroidism   . Hyperlipidemia   . Dizziness   . Exudative senile macular degeneration of retina 07/28/2012  . Tear film insufficiency, unspecified 07/28/2012  . Dermatophytosis of foot 05/10/2012  . Unspecified constipation 05/10/2012  . Unspecified urinary incontinence 05/10/2012  . Urethrocele 02/11/2012  . Female stress incontinence 02/11/2012  . Depressive disorder, not elsewhere classified 11/10/2011  . Other malaise and fatigue 11/10/2011  . Loss of weight 11/10/2011  . Macular degeneration (senile) of retina, unspecified 09/24/2011  . Unspecified disorder of kidney and ureter 09/24/2011  . Pathologic fracture of vertebrae 09/24/2011  . Memory loss 09/24/2011  . Abnormality of gait 09/24/2011  . Nocturia 09/24/2011  . Malignant neoplasm of breast (female), unspecified site 08/13/2011  . Hyposmolality and/or hyponatremia 08/13/2011  . Anemia, unspecified 08/13/2011  . Closed fracture of unspecified part of vertebral column without mention of spinal cord injury 08/13/2011  . Personal history of fall 08/13/2011   PSH  Past Surgical History  Procedure Laterality Date  . Coronary angioplasty  03/23/94    NORMAL LEFT VENTRICULAR SYSTOLIC FUNCTION, EF 74%  . Av fistula repair    . Hip surgery  10/07/2010    Dr. Annell Greening  left   . Coronary angioplasty with stent placement      RIGHT CORONARY ARTERY  . Mastectomy      left    FH  family history includes Cancer in her brother and sister; Heart attack in her father; and Heart disease in her mother. SH History   Social History Narrative   Single never married. 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 Currently resides in assisted living facility at [facility] since 2012. No significant smoking or alcohol history.    Patient's nephew, Geradine Girt is main support.   Has living will and DO NOT RESUSCITATE   Review of Systems  DATA OBTAINED: from patient GENERAL: Feels well No fevers, fatigue, change in appetite or weight SKIN: No itch, rash or open wounds EYES: No eye pain, dryness or itching  No change in vision. Has macular degeneration EARS: No earache, tinnitus, wears hearing aids NOSE: No congestion, drainage or bleeding MOUTH/THROAT: No mouth or tooth pain  No difficulty chewing or swallowing RESPIRATORY: No cough, wheezing, SOB CARDIAC: No chest pain, palpitations  No edema. CHEST/BREASTS: No discomfort, discharge or lumps in breasts GI: No abdominal pain  No N/V/D or constipation  No heartburn or reflux  GU: No dysuria, frequency or urgency  No change in urine volume or character Nocturia x1-2, has urine leakage   MUSCULOSKELETAL: No joint pain, swelling or stiffness  No back pain  No muscle ache, pain, weakness  Gait is steady  No recent falls.  NEUROLOGIC: No dizziness, fainting, headache  No change in mental status(mild STML) PSYCHIATRIC:  No feelings of anxiety, depression Sleeps well.  No behavior issue.   Physical Exam Filed Vitals:   02/23/13 1033  BP: 160/78  Pulse: 72  Temp: 97.4 F (36.3 C)  TempSrc: Oral  Height: 4\' 10"  (1.473 m)  Weight: 104 lb (47.174 kg)   Body mass index is 21.74 kg/(m^2).  GENERAL APPEARANCE: No acute distress, appropriately groomed, normal body habitus. Alert, pleasant, conversant. HEAD: Normocephalic, atraumatic EYES: Conjunctiva/lids clear. Pupils round, reactive. EOMs intact.  EARS: External exam WNL, canals clear, TM WNL. Hearing Decreased. NOSE: No deformity or discharge. MOUTH/THROAT: Lips w/o lesions. Oral mucosa, tongue moist, w/o lesion. Oropharynx w/o redness or lesions.  NECK: Supple, full ROM. No thyroid tenderness, enlargement or nodule LYMPHATICS: No head, neck or  supraclavicular adenopathy CHEST/BREASTS: Right breast without lumps or skin changes. Left mastectomy site well healed no sign of local recurrence RESPIRATORY: Breathing is even, unlabored. Lung sounds are clear and full.  CARDIOVASCULAR: Heart RRR. No murmur or extra heart sounds  ARTERIAL: No carotid, aortic or femoral bruit. Carotid, Femoral, Popliteal, DP,PT pulse 2+.  VENOUS: No varicosities. No venous stasis skin changes  EDEMA: No peripheral or periorbital edema. GASTROINTESTINAL: Abdomen is soft, non-tender, not distended w/ normal bowel sounds. No hepatic or splenic enlargement. No mass, ventral or inguinal hernia. GENITOURINARY: Bladder non tender, not distended. MUSCULOSKELETAL: Moves all extremities with full ROM, strength and tone. Back is with kyphosis, No scoliosis or spinal process tenderness. Gait is steady with walker NEUROLOGIC: Oriented to time, place, person. Cranial nerves 2-12 grossly intact, speech clear, no tremor. Patella, brachial DTR 1+ PSYCHIATRIC: Mood and affect appropriate to situation  ASSESSMENT/PLAN  Hypertension Weekly blood pressure readings have been stable, range is generally 135-160/75-80, pulse 60-74. Most recent labs satisfactory. Continue current medication  Urinary incontinence Mirabegron was increased at last visit, 11/2012. Pt reports she still has leakage, is managing OK. No c/o dry mouth. Continue medication.  Hypothyroidism Most recent TSH within normal limits no symptoms of hypothyroidism. Continue current medication.  Memory deficit Most recent MMSE 2 2014 22/30. Failed clock test. Functional status unchanged, language skills are intact. Continue donezepil. Remains appropriate for Assisted Living settimg  Abnormality of gait Patient continues to ambulate with a walker, is noted to walk fast. In the last year she's had one fall in December and a near fall in February. No injuries with either of these events.    Follow up: 3  months  Rainna Nearhood T.Kemberly Taves, NP-C 02/23/2013

## 2013-02-23 NOTE — Progress Notes (Deleted)
Patient ID: Kendra Tapia, female   DOB: 1912-09-15, 79 y.o.   MRN: [id number]     Code Status: ***  Allergies  Allergen Reactions  . Lipitor (Atorvastatin Calcium)     MUSCLE ACHES  . Micardis (Telmisartan)   . Other     Adhesive tape,chocolate,citrus,berriespaper tape    Chief Complaint: ***  HPI:  ***  Past Medical History  Diagnosis Date  . IHD (ischemic heart disease)   . Hypertension   . Nausea   . Coronary artery disease   . Hypothyroidism   . Hyperlipidemia   . Dizziness   . Exudative senile macular degeneration of retina 07/28/2012  . Tear film insufficiency, unspecified 07/28/2012  . Dermatophytosis of foot 05/10/2012  . Unspecified constipation 05/10/2012  . Unspecified urinary incontinence 05/10/2012  . Urethrocele 02/11/2012  . Female stress incontinence 02/11/2012  . Depressive disorder, not elsewhere classified 11/10/2011  . Other malaise and fatigue 11/10/2011  . Loss of weight 11/10/2011  . Macular degeneration (senile) of retina, unspecified 09/24/2011  . Unspecified disorder of kidney and ureter 09/24/2011  . Pathologic fracture of vertebrae 09/24/2011  . Memory loss 09/24/2011  . Abnormality of gait 09/24/2011  . Nocturia 09/24/2011  . Malignant neoplasm of breast (female), unspecified site 08/13/2011  . Hyposmolality and/or hyponatremia 08/13/2011  . Anemia, unspecified 08/13/2011  . Closed fracture of unspecified part of vertebral column without mention of spinal cord injury 08/13/2011  . Personal history of fall 08/13/2011   Past Surgical History  Procedure Laterality Date  . Coronary angioplasty  03/23/94    NORMAL LEFT VENTRICULAR SYSTOLIC FUNCTION, EF 74%  . Av fistula repair    . Hip surgery  10/07/2010    Dr. Annell Greening  left   . Coronary angioplasty with stent placement      RIGHT CORONARY ARTERY  . Mastectomy      left    Social History:   reports that she quit smoking about 31 years ago. She does not have any smokeless tobacco  history on file. She reports that she does not drink alcohol or use illicit drugs.  Family History  Problem Relation Age of Onset  . Heart disease Mother   . Heart attack Father     Medications: Patient's Medications  New Prescriptions   No medications on file  Previous Medications   ACETAMINOPHEN (TYLENOL) 325 MG TABLET    Take 650 mg by mouth every 6 (six) hours as needed for pain.   ACETAMINOPHEN (TYLENOL) 500 MG TABLET    Take 500 mg by mouth every 6 (six) hours as needed.     AMOXICILLIN (AMOXIL) 500 MG CAPSULE    Take 4 capsules 1 hour prior to dental procedures   ASPIRIN 81 MG TABLET    Take 81 mg by mouth daily.     CALCITONIN, SALMON, (MIACALCIN/FORTICAL) 200 UNIT/ACT NASAL SPRAY    Place 1 spray into the nose daily.     CLONIDINE (CATAPRES) 0.1 MG TABLET    Take 0.1 mg by mouth every 4 (four) hours as needed.     LEVOTHYROXINE (LEVOTHROID) 25 MCG TABLET    Brand only   LOSARTAN (COZAAR) 100 MG TABLET    Take 100 mg by mouth daily.     METOPROLOL (LOPRESSOR) 50 MG TABLET    Take 1 tablet by mouth Twice daily.   MIRABEGRON ER (MYRBETRIQ) 50 MG TB24    Take 50 mg by mouth daily.   MIRTAZAPINE (REMERON) 7.5 MG TABLET  Take by mouth Daily.   NITROGLYCERIN (NITROSTAT) 0.4 MG SL TABLET    Place 0.4 mg under the tongue every 5 (five) minutes as needed.     POLYETHYL GLYCOL-PROPYL GLYCOL (SYSTANE OP)    Apply to eye. As directed   POLYETHYLENE GLYCOL 3350 (MIRALAX PO)    Take by mouth daily.     SENNA (SENOKOT) 8.6 MG TABLET    Take 2 tablets by mouth daily.   TRIAMCINOLONE CREAM (KENALOG) 0.1 %    Apply topically every morning.  Modified Medications   No medications on file  Discontinued Medications   No medications on file    Review of Systems:  ***   There were no vitals filed for this visit. Physical Exam: ***    Labs reviewed:  Radiological Exams:   EKG: Independently reviewed. ***  Assessment/Plan No problem-specific assessment & plan notes found for this  encounter.     {Rehab current functional status:30803}  Family/ staff Communication: ***   Goals of care: ***   Labs/tests ordered   Zavien Clubb T.Courtni Balash, NP-C 02/23/2013

## 2013-03-01 ENCOUNTER — Encounter: Payer: Self-pay | Admitting: Geriatric Medicine

## 2013-03-01 DIAGNOSIS — I10 Essential (primary) hypertension: Secondary | ICD-10-CM | POA: Insufficient documentation

## 2013-03-01 DIAGNOSIS — R32 Unspecified urinary incontinence: Secondary | ICD-10-CM | POA: Insufficient documentation

## 2013-03-01 DIAGNOSIS — R269 Unspecified abnormalities of gait and mobility: Secondary | ICD-10-CM | POA: Insufficient documentation

## 2013-03-01 NOTE — Assessment & Plan Note (Signed)
Most recent TSH within normal limits no symptoms of hypothyroidism. Continue current medication.

## 2013-03-01 NOTE — Assessment & Plan Note (Addendum)
Weekly blood pressure readings have been stable, range is generally 135-160/75-80, pulse 60-74. Most recent labs satisfactory. Continue current medication

## 2013-03-01 NOTE — Assessment & Plan Note (Signed)
Most recent MMSE 2 2014 22/30. Failed clock test. Functional status unchanged, language skills are intact. Continue donezepil. Remains appropriate for Assisted Living settimg

## 2013-03-01 NOTE — Assessment & Plan Note (Signed)
Mirabegron was increased at last visit, 11/2012. Pt reports she still has leakage, is managing OK. No c/o dry mouth. Continue medication.

## 2013-03-01 NOTE — Assessment & Plan Note (Signed)
Patient continues to ambulate with a walker, is noted to walk fast. In the last year she's had one fall in December and a near fall in February. No injuries with either of these events.

## 2013-04-14 ENCOUNTER — Non-Acute Institutional Stay: Payer: Medicare Other | Admitting: Geriatric Medicine

## 2013-04-14 ENCOUNTER — Encounter: Payer: Self-pay | Admitting: Geriatric Medicine

## 2013-04-14 DIAGNOSIS — M546 Pain in thoracic spine: Secondary | ICD-10-CM

## 2013-04-14 DIAGNOSIS — I10 Essential (primary) hypertension: Secondary | ICD-10-CM

## 2013-04-14 NOTE — Progress Notes (Signed)
Patient ID: Kendra Tapia, female   DOB: 1911/10/30, 79 y.o.   MRN: [id number] [phone])  Chief Complaint  Patient presents with  . Back Pain  . Hypertension    HPI: This is a 79 y.o. female resident of [facility], [facility]isted [facility]  [place].  Evaluation is requested today due to left upper back pain after fall last week and intermittent elevated blood pressure readings. This patient had an unwitnessed fall in her apartment on 03/08/2013. She was found sitting on her buttocks in front of her kitchen sink. Patient reports that she fell backwards hitting the wall. Denied hitting her head. She had some soreness in her upper back over the next 2 days. On June 30 she complained of significant lower back and left hip pain, was having difficulty ambulating. X-ray result was reported as no significant acute changes, review of report today shows probable compression fx L1. Yesterday patient complained of right upper back pain as well as left leg pain. Tylenol appeared to help. Today patient tells me she has this right upper back soreness, can pinpoint the area. Patient has had intermittent elevated blood pressure readings since her fall. Blood pressure has been as high as 217/81. Her blood pressure responds to small doses of p.r.n. clonidine.     Allergies  Allergen Reactions  . Lipitor (Atorvastatin Calcium)     MUSCLE ACHES  . Micardis (Telmisartan)   . Other     Adhesive tape,chocolate,citrus,berriespaper tape   Medications Reviewed  DATA REVIEWED  Radiologic Exams:    [facility]  04/11/2013 x-ray of left hip moderate to severe diffuse osteopenia. Old healed fracture deformity left intratrochanteric region. Orthopedic hardware in place.  X-ray left femur old healed fracture deformity at the left intratrochanteric region. Moderate diffuse osteopenia  X-ray lumbar spine mild to moderate levoconvex curvature of his lumbar spine. Mild  to moderate moderate to severe diffuse osteopenia, mild osteoarthritis diffusely. Moderate vertebral compression deformity L1 with 50% loss of body height.  X-ray sacrococcyx moderate to severe diffuse osteopenia. No acute fracture or lytic destructive lesion. Moderate osteoarthritis sacroiliac joints.   Laboratory Studies:    [facility], external 09/25/2011 CMP: Glucose 92, BUN 27, Creatinine 0.94, Sodium 132, Potassium 5.1   CBC: Rbc 3.56, Hgb 11.4, Hct 34.1   TSH 7.085   Vitamin B12 795  01/01/2012 CMP: glucose 92, BUN 26, Creatinine 1.05, Sodium 137, Potassium 4.9   CBC: Rbc 3.60, Hgb 11.2, Hct 34.5   Lipid: cholesterol 149, triglyceride 86, HDL 47, LDL 85   TSH 6.061  01/06/2012 TSH 5.038  12/07/2012: TSH 3.34  01/11/2013 : WBC 4.4, hemoglobin 10.9, hematocrit 32.5, platelets 223   glucose 74, BUN 20, creatinine 1.07, sodium 136, potassium 4.6 protein/LFTs WNL   Total cholesterol 176, triglycerides 52, HDL 53, LDL 114   Review of Systems   DATA OBTAINED: from patient,  GENERAL: Feels well   No fevers, fatigue, change in appetite or weight SKIN: No itch, rash or open wounds RESPIRATORY: No cough, wheezing, SOB CARDIAC: No chest pain, palpitations  No edema. GI: No abdominal pain  No N/V/D or constipation  No heartburn or reflux  MUSCULOSKELETAL: SEE HPI  Gait is steady with walker  NEUROLOGIC: Intermittent, mild dizziness  No fainting, headache  No change in mental status.  PSYCHIATRIC: No feelings of anxiety, depression Sleeps well.  No behavior issue.    Physical Exam Filed Vitals:   04/14/13 1636  BP: 142/70  Pulse: 76  GENERAL APPEARANCE: No acute distress, appropriately groomed, normal body habitus. Alert, pleasant, conversant. HEAD: Normocephalic, atraumatic EYES: Conjunctiva/lids clear. Pupils round, reactive. Marland Kitchen  EARS: Decreased hearing evident.   RESPIRATORY: Breathing is even, unlabored. Lung sounds are clear and full.  CARDIOVASCULAR: Heart RRR. No murmur or  extra heart sounds  EDEMA: No peripheral  edema.  MUSCULOSKELETAL: Moves all extremities with full ROM, strength and tone. Back is with kyphosis, No scoliosis. Mild mid thoracic spinal process tenderness, Point  tenderness left posterior 5,6th ribs. Gait is steady w/ walker NEUROLOGIC: Oriented to time, place, person. Speech clear, no tremor.   PSYCHIATRIC: Mood and affect appropriate to situation  ASSESSMENT/PLAN  Hypertension Recent BP reading fluctuations may be related to increased pain. Continue close monitoring, use clonidine as needed. Anticipate readings will return to normal range when back pian resolves  Thoracic back pain Pt had a fall approx. 1 week ago. Initially with lower back, left hip pain- that has resolved. Now with thoracic/ rib pain. Pain comes/ goes, Tylenol has been useful in pain management. Will obtain Thoracic spine films to r/o fracture.   Follow up: AS scheduled in clinic or as needed  Elly Haffey T.Janisha Bueso, NP-C 04/14/2013

## 2013-05-02 DIAGNOSIS — M546 Pain in thoracic spine: Secondary | ICD-10-CM | POA: Insufficient documentation

## 2013-05-02 NOTE — Assessment & Plan Note (Addendum)
Pt had a fall approx. 1 week ago. Initially with lower back, left hip pain- that has resolved. Now with thoracic/ rib pain. Pain comes/ goes, Tylenol has been useful in pain management. Will obtain Thoracic spine films to r/o fracture.

## 2013-05-02 NOTE — Assessment & Plan Note (Signed)
Recent BP reading fluctuations may be related to increased pain. Continue close monitoring, use clonidine as needed. Anticipate readings will return to normal range when back pian resolves

## 2013-05-20 ENCOUNTER — Ambulatory Visit (INDEPENDENT_AMBULATORY_CARE_PROVIDER_SITE_OTHER): Payer: Medicare Other | Admitting: Cardiology

## 2013-05-20 ENCOUNTER — Encounter: Payer: Self-pay | Admitting: Cardiology

## 2013-05-20 VITALS — BP 146/80 | HR 68 | Ht 59.0 in | Wt 104.0 lb

## 2013-05-20 DIAGNOSIS — I350 Nonrheumatic aortic (valve) stenosis: Secondary | ICD-10-CM

## 2013-05-20 DIAGNOSIS — I10 Essential (primary) hypertension: Secondary | ICD-10-CM

## 2013-05-20 DIAGNOSIS — R634 Abnormal weight loss: Secondary | ICD-10-CM

## 2013-05-20 DIAGNOSIS — I119 Hypertensive heart disease without heart failure: Secondary | ICD-10-CM

## 2013-05-20 DIAGNOSIS — I2510 Atherosclerotic heart disease of native coronary artery without angina pectoris: Secondary | ICD-10-CM

## 2013-05-20 DIAGNOSIS — I359 Nonrheumatic aortic valve disorder, unspecified: Secondary | ICD-10-CM

## 2013-05-20 MED ORDER — LOSARTAN POTASSIUM 50 MG PO TABS: 50.0000 mg | ORAL_TABLET | Freq: Every day | ORAL | Status: DC

## 2013-05-20 NOTE — Assessment & Plan Note (Signed)
The patient has a history of mild aortic stenosis.  She's not having any symptoms from her aortic stenosis.

## 2013-05-20 NOTE — Progress Notes (Signed)
Kendra Tapia Date of Birth:  [birth year] [facility] [address] [place], [place]  [address] [phone]         Fax   [phone]  History of Present Illness: This pleasant 79 year old woman is seen for a four-month followup office visit. She has a history of ischemic heart disease and a history of aortic stenosis and a history of essential hypertension. She also has hypothyroidism and hypercholesterolemia. Now lives in assisted living at [facility]. She no longer drives her car. She has not been experiencing any prolonged chest discomfort. She has occasional brief chest pain which does not require sublingual nitroglycerin. She has a known murmur of mild aortic stenosis. Since last visit she has been doing well with no new cardiac complaints. She does complain about the food and attributes her 1 pound weight loss to the fact that she doesn't like the food at [facility].  She has had some labile blood pressure and she complains of occasional dizziness when she stands up even though she tries to stand up slowly.   Current Outpatient Prescriptions  Medication Sig Dispense Refill  . acetaminophen (TYLENOL) 325 MG tablet Take 650 mg by mouth every 6 (six) hours as needed for pain.      Marland Kitchen aspirin 81 MG tablet Take 81 mg by mouth daily.        . calcitonin, salmon, (MIACALCIN/FORTICAL) 200 UNIT/ACT nasal spray Place 1 spray into the nose daily.        . cetaphil (CETAPHIL) lotion Use as directed      . cloNIDine (CATAPRES) 0.1 MG tablet Take 0.1 mg by mouth every 4 (four) hours as needed.        Marland Kitchen levothyroxine (LEVOTHROID) 25 MCG tablet Brand only  30 tablet  11  . losartan (COZAAR) 50 MG tablet Take 1 tablet (50 mg total) by mouth daily.  30 tablet  11  . magnesium hydroxide (MILK OF MAGNESIA) 800 MG/5ML suspension Take 5 mLs by mouth daily as needed for constipation.      . metoprolol (LOPRESSOR) 50 MG tablet Take 1 tablet by mouth Twice daily.      . mirabegron ER  (MYRBETRIQ) 50 MG TB24 Take 50 mg by mouth daily.      . mirtazapine (REMERON) 7.5 MG tablet Take by mouth Daily.      . Multiple Vitamin (MULTIVITAMIN) tablet Take 1 tablet by mouth daily.      . Multiple Vitamins-Minerals (PRESERVISION AREDS PO) Take 1 capsule by mouth. Twice a day      . nitroGLYCERIN (NITROSTAT) 0.4 MG SL tablet Place 0.4 mg under the tongue every 5 (five) minutes as needed.        Bertram Gala Glycol-Propyl Glycol (SYSTANE OP) Apply to eye. As directed      . Polyethylene Glycol 3350 (MIRALAX PO) Take by mouth daily.        Marland Kitchen senna (SENOKOT) 8.6 MG tablet Take 2 tablets by mouth daily.      Marland Kitchen triamcinolone cream (KENALOG) 0.1 % Apply topically every morning.       No current facility-administered medications for this visit.    Allergies  Allergen Reactions  . Lipitor (Atorvastatin Calcium)     MUSCLE ACHES  . Micardis (Telmisartan)   . Other     Adhesive tape,chocolate,citrus,berriespaper tape    Patient Active Problem List   Diagnosis Date Noted  . Coronary artery disease 03/13/2011    Priority: High  . Benign hypertensive heart disease  without heart failure 03/13/2011    Priority: High  . Thoracic back pain 05/02/2013  . Urinary incontinence 03/01/2013  . Abnormality of gait 03/01/2013  . Hypertension   . Aortic stenosis 02/09/2012  . Macular degeneration (senile) of retina, unspecified 09/24/2011  . Nocturia 09/24/2011  . Weight loss, unintentional 09/12/2011  . Hypothyroidism 03/13/2011  . Hypercholesterolemia 03/13/2011  . Memory deficit 03/02/2011    History  Smoking status  . Former Smoker  . Quit date: 03/11/1981  Smokeless tobacco  . Never Used    History  Alcohol Use No    Family History  Problem Relation Age of Onset  . Heart disease Mother   . Heart attack Father   . Cancer Sister   . Cancer Brother     Review of Systems: Constitutional: no fever chills diaphoresis or fatigue or change in weight.  Head and neck: no  hearing loss, no epistaxis, no photophobia or visual disturbance. Respiratory: No cough, shortness of breath or wheezing. Cardiovascular: No chest pain peripheral edema, palpitations. Gastrointestinal: No abdominal distention, no abdominal pain, no change in bowel habits hematochezia or melena. Genitourinary: No dysuria, no frequency, no urgency, no nocturia. Musculoskeletal:No arthralgias, no back pain, no gait disturbance or myalgias. Neurological: No dizziness, no headaches, no numbness, no seizures, no syncope, no weakness, no tremors. Hematologic: No lymphadenopathy, no easy bruising. Psychiatric: No confusion, no hallucinations, no sleep disturbance.    Physical Exam: Filed Vitals:   05/20/13 1416  BP: 146/80  Pulse: 68   the general appearance reveals a well-developed elderly woman in no distress.The head and neck exam reveals pupils equal and reactive.  Extraocular movements are full.  There is no scleral icterus.  The mouth and pharynx are normal.  The neck is supple.  The carotids reveal no bruits.  The jugular venous pressure is normal.  The  thyroid is not enlarged.  There is no lymphadenopathy.  The chest is clear to percussion and auscultation.  There are no rales or rhonchi.  Expansion of the chest is symmetrical.  The precordium is quiet.  The first heart sound is normal.  The second heart sound is physiologically split.  There is no murmur gallop rub or click.  There is no abnormal lift or heave.  The abdomen is soft and nontender.  The bowel sounds are normal.  The liver and spleen are not enlarged.  There are no abdominal masses.  There are no abdominal bruits.  Extremities reveal good pedal pulses.  There is no phlebitis or edema.  There is no cyanosis or clubbing.  Strength is normal and symmetrical in all extremities.  There is no lateralizing weakness.  There are no sensory deficits.  The skin is warm and dry.  There is no rash.  EKG shows normal sinus rhythm with first  degree AV block and left anterior hemiblock and is unchanged from 10/11/12.   Assessment / Plan: Continue same medication except reduce losartan.  Recheck in 4 months for followup office visit.

## 2013-05-20 NOTE — Assessment & Plan Note (Signed)
The patient has a history of hypertension.  She is on metoprolol and losartan and her clonidine is available on a when necessary basis.  When I checked her in the office today her blood pressure dropped about 20 points from sitting to standing although she was not symptomatic today.  We will reduce her losartan down to 25 mg daily and see if her symptoms of orthostatic dizziness improve.  She also may add some salt to her food.

## 2013-05-20 NOTE — Patient Instructions (Addendum)
Your physician has recommended you make the following change in your medication:  Decrease losartan to 50 mg daily.  You may have some salt on food.  Your physician recommends that you schedule a follow-up appointment in: 4 months

## 2013-05-20 NOTE — Assessment & Plan Note (Signed)
At her last visit we encouraged her to try to gain weight.  She has been able he gained 3 pounds since last visit.

## 2013-05-30 ENCOUNTER — Non-Acute Institutional Stay: Payer: Medicare Other | Admitting: Internal Medicine

## 2013-05-30 ENCOUNTER — Encounter: Payer: Self-pay | Admitting: Internal Medicine

## 2013-05-30 VITALS — BP 124/76 | HR 68 | Ht 59.0 in | Wt 105.0 lb

## 2013-05-30 DIAGNOSIS — M79675 Pain in left toe(s): Secondary | ICD-10-CM | POA: Insufficient documentation

## 2013-05-30 DIAGNOSIS — R413 Other amnesia: Secondary | ICD-10-CM

## 2013-05-30 DIAGNOSIS — M259 Joint disorder, unspecified: Secondary | ICD-10-CM

## 2013-05-30 DIAGNOSIS — I359 Nonrheumatic aortic valve disorder, unspecified: Secondary | ICD-10-CM

## 2013-05-30 DIAGNOSIS — L608 Other nail disorders: Secondary | ICD-10-CM

## 2013-05-30 DIAGNOSIS — I10 Essential (primary) hypertension: Secondary | ICD-10-CM

## 2013-05-30 DIAGNOSIS — R269 Unspecified abnormalities of gait and mobility: Secondary | ICD-10-CM

## 2013-05-30 DIAGNOSIS — I350 Nonrheumatic aortic (valve) stenosis: Secondary | ICD-10-CM

## 2013-05-30 DIAGNOSIS — L602 Onychogryphosis: Secondary | ICD-10-CM | POA: Insufficient documentation

## 2013-05-30 DIAGNOSIS — E039 Hypothyroidism, unspecified: Secondary | ICD-10-CM

## 2013-05-30 DIAGNOSIS — M79609 Pain in unspecified limb: Secondary | ICD-10-CM

## 2013-05-30 DIAGNOSIS — I2510 Atherosclerotic heart disease of native coronary artery without angina pectoris: Secondary | ICD-10-CM

## 2013-05-30 NOTE — Progress Notes (Signed)
Subjective:    Patient ID: Kendra Tapia, female    DOB: [birth year], 79 y.o.   MRN: [id number]  HPI Pain of left great toe: Has pain in the left great toe for a week. Has overgrown toenails that are pressing against the skin in a painful way.  Coronary artery disease:  asymptomatic  Hypertension: Controlled. Recently had low BP and Losartan was reduced by Dr. Patty Sermons.  Hypothyroidism: Controlled on supplements  Abnormality of gait: Pain in the right hip and down the right leg makes her unsteady walking.  Memory deficit:Stable  Onychogryphosis: Chronic condition unchanged  Aortic stenosis: chronic and followed by Dr. Patty Sermons  Unspecified disorder of joint of pelvic region and thigh: Chronic discomfort in the right hip and down the right leg. No recent changes.    Current Outpatient Prescriptions on File Prior to Visit  Medication Sig Dispense Refill  . acetaminophen (TYLENOL) 325 MG tablet Take 650 mg by mouth every 6 (six) hours as needed for pain. Take one tablet twice daily      . aspirin 81 MG tablet Take 81 mg by mouth daily.        . calcitonin, salmon, (MIACALCIN/FORTICAL) 200 UNIT/ACT nasal spray Place 1 spray into the nose daily.        . cetaphil (CETAPHIL) lotion Use as directed      . cloNIDine (CATAPRES) 0.1 MG tablet Take 0.1 mg by mouth every 4 (four) hours as needed.        Marland Kitchen levothyroxine (LEVOTHROID) 25 MCG tablet Brand only  30 tablet  11  . losartan (COZAAR) 50 MG tablet Take 1 tablet (50 mg total) by mouth daily.  30 tablet  11  . magnesium hydroxide (MILK OF MAGNESIA) 800 MG/5ML suspension Take 5 mLs by mouth daily as needed for constipation.      . metoprolol (LOPRESSOR) 50 MG tablet Take 1 tablet by mouth Twice daily.      . mirabegron ER (MYRBETRIQ) 50 MG TB24 Take 50 mg by mouth daily.      . mirtazapine (REMERON) 7.5 MG tablet Take by mouth Daily.      . Multiple Vitamin (MULTIVITAMIN) tablet Take 1 tablet by mouth daily.      . Multiple  Vitamins-Minerals (PRESERVISION AREDS PO) Take 1 capsule by mouth. Twice a day      . nitroGLYCERIN (NITROSTAT) 0.4 MG SL tablet Place 0.4 mg under the tongue every 5 (five) minutes as needed.        Bertram Gala Glycol-Propyl Glycol (SYSTANE OP) Apply to eye. As directed      . Polyethylene Glycol 3350 (MIRALAX PO) Take by mouth daily.        Marland Kitchen senna (SENOKOT) 8.6 MG tablet Take 2 tablets by mouth daily.      Marland Kitchen triamcinolone cream (KENALOG) 0.1 % Apply topically every morning.       No current facility-administered medications on file prior to visit.    Review of Systems  Constitutional: Negative.  Negative for fever.  HENT: Negative.   Eyes: Negative.   Respiratory: Negative.   Cardiovascular: Negative for chest pain and leg swelling.  Gastrointestinal: Negative.   Endocrine: Negative.   Genitourinary: Negative.   Musculoskeletal: Positive for gait problem.       Right hip and leg pains  Skin: Negative for pallor and rash.       Inflamed at left great toe.  Neurological: Negative.   Hematological: Negative.   Psychiatric/Behavioral: Negative for behavioral problems and  sleep disturbance. The patient is not nervous/anxious.        Objective:BP 124/76  Pulse 68  Ht 4\' 11"  (1.499 m)  Wt 105 lb (47.628 kg)  BMI 21.2 kg/m2    Physical Exam  Constitutional: She is oriented to person, place, and time. She appears well-developed and well-nourished. No distress.  HENT:  Head: Normocephalic and atraumatic.  Right Ear: External ear normal.  Left Ear: External ear normal.  Nose: Nose normal.  Mouth/Throat: Oropharynx is clear and moist.  Eyes: Conjunctivae and EOM are normal. Pupils are equal, round, and reactive to light.  Neck: No JVD present. No tracheal deviation present. No thyromegaly present.  Cardiovascular: Normal rate, regular rhythm, normal heart sounds and intact distal pulses.  Exam reveals no gallop and no friction rub.   No murmur heard. Pulmonary/Chest: Breath  sounds normal. No respiratory distress. She has no wheezes. She has no rales. She exhibits no tenderness.  Abdominal: She exhibits no distension and no mass. There is no tenderness.  Musculoskeletal: Normal range of motion. She exhibits tenderness. She exhibits no edema.  Tender in the right hip. Uses walker.  Lymphadenopathy:    She has no cervical adenopathy.  Neurological: She is alert and oriented to person, place, and time.  Skin: No rash noted. No erythema. No pallor.  Psychiatric: She has a normal mood and affect. Her behavior is normal. Judgment and thought content normal.          Assessment & Plan:  Coronary artery disease: asymptomatic  Hypertension: reduce Losartan to 25 mg by Dr. Patty Sermons recently.  Hypothyroidism: continue current supplement  Abnormality of gait: continue use of walker  Memory deficit: mild  Onychogryphosis: needs podiatry consult to trim toenails  Pain of left great toe: caused by slightly ingrown nail  Aortic stenosis: chronic and unchanged  Unspecified disorder of joint of pelvic region and thigh: pain in the right hip likely due to OA

## 2013-06-06 DIAGNOSIS — M259 Joint disorder, unspecified: Secondary | ICD-10-CM | POA: Insufficient documentation

## 2013-06-06 NOTE — Patient Instructions (Signed)
See podiatrist as planned

## 2013-08-24 ENCOUNTER — Non-Acute Institutional Stay: Payer: Medicare Other | Admitting: Geriatric Medicine

## 2013-08-24 ENCOUNTER — Encounter: Payer: Self-pay | Admitting: Geriatric Medicine

## 2013-08-24 VITALS — BP 128/64 | HR 76 | Ht 59.0 in | Wt 104.0 lb

## 2013-08-24 DIAGNOSIS — R413 Other amnesia: Secondary | ICD-10-CM

## 2013-08-24 DIAGNOSIS — E039 Hypothyroidism, unspecified: Secondary | ICD-10-CM

## 2013-08-24 DIAGNOSIS — I10 Essential (primary) hypertension: Secondary | ICD-10-CM

## 2013-08-24 DIAGNOSIS — I119 Hypertensive heart disease without heart failure: Secondary | ICD-10-CM

## 2013-08-24 NOTE — Progress Notes (Signed)
Patient ID: Kendra Tapia, female   DOB: Jun 30, 1912, 79 y.o.   MRN: [id number] [facility] [phone])  Code Status: DNR Contact Information   Name Relation Home Work Mobile   [name],[name] Relative [phone]        Chief Complaint  Patient presents with  . Medical Managment of Chronic Issues    blood prssure, gait, memory    HPI: This is a 79 y.o. female resident of [facility], Assisted Living section evaluated today for management of ongoing medical issues.  Review of record shows patient's weekly VS including  blood pressure measures have been satisfactory, weight has been stable. Patient reports she doesn't have much appetite but does eat something at every meal. Eats her meals in the dining room. She is sleeping well at night, gets up once to void. She also reports that she likes to sleep a lot during the day. Facility record shows this patient continues to be active participant in Assisted Living activities in and out of the building. There has been some mild decrease decline in her cognition and functional status.    Functional status Bathing: Moderate Assist Bladder Management:  Pads, Bowel Management: Pads Diet / Swallowing: PO Diet: Regular, Feeding: Independent Hygiene and Grooming: Independent, Toileting / Clothing: Minimal Assist Walk: Independent w/ walker    Allergies  Allergen Reactions  . Lipitor [Atorvastatin Calcium]     MUSCLE ACHES  . Micardis [Telmisartan]   . Other     Adhesive tape,chocolate,citrus,berriespaper tape   Medications Reviewed  DATA REVIEWED  Radiologic Exams  [facility]  04/11/2013 x-ray of left hip moderate to severe diffuse osteopenia. Old healed fracture deformity left intratrochanteric region. Orthopedic hardware in place.  X-ray left femur old healed fracture deformity at the left intratrochanteric region. Moderate diffuse osteopenia  X-ray lumbar spine mild to moderate  levoconvex curvature of his lumbar spine. Mild to moderate moderate to severe diffuse osteopenia, mild osteoarthritis diffusely. Moderate vertebral compression deformity L1 with 50% loss of body height.  X-ray sacrococcyx moderate to severe diffuse osteopenia. No acute fracture or lytic destructive lesion. Moderate osteoarthritis sacroiliac joints.  Cardiovascular Exams:   Laboratory Studies  [facility] lab     01/01/2012 CMP: glucose 92, BUN 26, Creatinine 1.05, Sodium 137, Potassium 4.9   CBC: Rbc 3.60, Hgb 11.2, Hct 34.5   Lipid: cholesterol 149, triglyceride 86, HDL 47, LDL 85   TSH 6.061  01/06/2012 TSH 5.038  12/07/2012: TSH 3.34  01/11/2013 : WBC 4.4, hemoglobin 10.9, hematocrit 32.5, platelets 223   glucose 74, BUN 20, creatinine 1.07, sodium 136, potassium 4.6 protein/LFTs WNL   Total cholesterol 176, triglycerides 52, HDL 53, LDL 114  Review of Systems  DATA OBTAINED: from patient, medical record GENERAL: Feels well   No fevers, fatigue, change in appetite or weight SKIN: No itch, rash or open wounds. Dry skin on legs and back EYES: No eye pain, dryness or itching  No change in vision (Macular degeneration, -continues to follow closely with Ophthamology) EARS: No earache, tinnitus, change in hearing NOSE: No congestion, drainage or bleeding MOUTH/THROAT: No mouth or tooth pain  No sore throat   No difficulty chewing or swallowing RESPIRATORY: No cough, wheezing, SOB CARDIAC: No chest pain, palpitations  No edema." I have a murmur" GI: No abdominal pain  No N/V/D or constipation  No heartburn or reflux Occ incontinence GU: No dysuria, frequency or urgency  No change in urine volume or character Nocturia x1, Occ incontinence  MUSCULOSKELETAL: No joint pain, swelling or stiffness  Back pain if stands too long No muscle ache, pain, weakness  Gait is steady w/walker  No recent falls.  NEUROLOGIC: Occ.dizziness if stands too quickly, No fainting, headache,  No change in mental  status(STML)  PSYCHIATRIC: No feelings of anxiety, depression Sleeps well.  No behavior issue.    Physical Exam Filed Vitals:   08/24/13 0937  BP: 128/64  Pulse: 76  Height: 4\' 11"  (1.499 m)  Weight: 104 lb (47.174 kg)   Body mass index is 20.99 kg/(m^2).  GENERAL APPEARANCE: No acute distress, appropriately groomed, Thin, frail body habitus. Alert, pleasant, conversant. SKIN: No diaphoresis, rash, unusual lesions, wounds HEAD: Normocephalic, atraumatic EYES: Conjunctiva/lids clear. Pupils round, reactive. Marland Kitchen  EARS: Poor Hearing  NOSE: No deformity or discharge. MOUTH/THROAT: Lips w/o lesions. Oral mucosa, tongue moist, w/o lesion. Oropharynx w/o redness or lesions.  NECK: Supple, full ROM. No thyroid tenderness, enlargement or nodule LYMPHATICS: No head, neck or supraclavicular adenopathy RESPIRATORY: Breathing is even, unlabored. Lung sounds are clear and full.  CARDIOVASCULAR: Heart RRR. No murmur or extra heart sounds  EDEMA: No peripheral edema.  GASTROINTESTINAL: Abdomen is soft, non-tender, not distended w/ normal bowel sounds.  MUSCULOSKELETAL: Moves all extremities with full ROM, strength and tone. Back with kyphosis, No scoliosis or spinal process tenderness. Gait is steady NEUROLOGIC: Oriented to time, place, person.Speech clear, no tremor.  PSYCHIATRIC: Mood and affect appropriate to situation  ASSESSMENT/PLAN  Benign hypertensive heart disease without heart failure Blood pressure well controlled on current medication. Update labs  Hypertension Blood pressure well controlled on current medication. Update labs   Hypothyroidism Update lab, continue daily supplement  Memory deficit Slight decline in cognitive and functional status in the last 6 months: Facility record shows patient is sometimes disoriented and forgetful, she is occasionally incontinent of urine and bowel. She does require some assistance with bathing and dressing. Language skills are intact, remains  interactive and active in facility activities. Remains appropriate for Assisted Living setting. Repeat MMSE in clock test. Update lab   Follow up: 3 months Dr. Chilton Si, 6 months [facility]Krell-NP  Lab 08/25/2013 CBC, CMP, TSH  Kendra Tapia T.Jagdeep Ancheta, NP-C 08/24/2013

## 2013-08-25 LAB — CBC AND DIFFERENTIAL
HCT: 29 % — AB (ref 36–46)
Hemoglobin: 9.8 g/dL — AB (ref 12.0–16.0)
Platelets: 195 10*3/uL (ref 150–399)
WBC: 4.1 10^3/mL

## 2013-08-25 LAB — HEPATIC FUNCTION PANEL
ALK PHOS: 70 U/L (ref 25–125)
ALT: 8 U/L (ref 7–35)
AST: 15 U/L (ref 13–35)
Bilirubin, Total: 0.5 mg/dL

## 2013-08-25 LAB — TSH: TSH: 3.39 u[IU]/mL (ref 0.41–5.90)

## 2013-08-25 LAB — BASIC METABOLIC PANEL
BUN: 24 mg/dL — AB (ref 4–21)
CREATININE: 1.2 mg/dL — AB (ref 0.5–1.1)
Glucose: 86 mg/dL
POTASSIUM: 4.3 mmol/L (ref 3.4–5.3)
SODIUM: 138 mmol/L (ref 137–147)

## 2013-08-27 NOTE — Assessment & Plan Note (Signed)
Slight decline in cognitive and functional status in the last 6 months: Facility record shows patient is sometimes disoriented and forgetful, she is occasionally incontinent of urine and bowel. She does require some assistance with bathing and dressing. Language skills are intact, remains interactive and active in facility activities. Remains appropriate for Assisted Living setting. Repeat MMSE in clock test. Update lab

## 2013-08-27 NOTE — Assessment & Plan Note (Signed)
Blood pressure well controlled on current medication. Update labs

## 2013-08-27 NOTE — Assessment & Plan Note (Signed)
Blood pressure well controlled on current medication. Update labs  

## 2013-08-27 NOTE — Assessment & Plan Note (Signed)
Update lab, continue daily supplement

## 2013-09-20 ENCOUNTER — Ambulatory Visit: Payer: Medicare Other | Admitting: Cardiology

## 2013-09-26 ENCOUNTER — Ambulatory Visit (INDEPENDENT_AMBULATORY_CARE_PROVIDER_SITE_OTHER): Payer: Medicare Other | Admitting: Cardiology

## 2013-09-26 ENCOUNTER — Encounter: Payer: Self-pay | Admitting: Cardiology

## 2013-09-26 VITALS — BP 175/80 | HR 105 | Ht 59.0 in

## 2013-09-26 DIAGNOSIS — I119 Hypertensive heart disease without heart failure: Secondary | ICD-10-CM

## 2013-09-26 DIAGNOSIS — I359 Nonrheumatic aortic valve disorder, unspecified: Secondary | ICD-10-CM

## 2013-09-26 DIAGNOSIS — I350 Nonrheumatic aortic (valve) stenosis: Secondary | ICD-10-CM

## 2013-09-26 DIAGNOSIS — I2510 Atherosclerotic heart disease of native coronary artery without angina pectoris: Secondary | ICD-10-CM

## 2013-09-26 MED ORDER — LOSARTAN POTASSIUM 50 MG PO TABS: 50.0000 mg | ORAL_TABLET | Freq: Every day | ORAL | Status: DC

## 2013-09-26 NOTE — Patient Instructions (Signed)
Increase Losartan to 50 mg daily     Your physician wants you to follow-up in: 4 months. You will receive a reminder letter in the mail two months in advance. If you don't receive a letter, please call our office to schedule the follow-up appointment.

## 2013-09-26 NOTE — Progress Notes (Addendum)
Kendra Tapia Date of Birth:  1912-04-26 [address] [name], [place]  [address] [phone]         Fax   [phone]  History of Present Illness: This pleasant 79 year old woman is seen for a four-month followup office visit. She has a history of ischemic heart disease and a history of aortic stenosis and a history of essential hypertension. She also has hypothyroidism and hypercholesterolemia. Now lives in assisted living at [facility]. She no longer drives her car. She has not been experiencing any prolonged chest discomfort. She has occasional brief chest pain which does not require sublingual nitroglycerin. She has a known murmur of mild aortic stenosis. Since last visit she has been doing well with no new cardiac complaints. .  She has had some labile blood pressure and she complains of occasional dizziness when she stands up even though she tries to stand up slowly.  She now has a bed tooth and is trying to decide whether to have it pulled are not.  The tooth is part of a 3 tooth bridge.  Current Outpatient Prescriptions  Medication Sig Dispense Refill  . acetaminophen (TYLENOL) 325 MG tablet Take 650 mg by mouth every 6 (six) hours as needed for pain. Take one tablet twice daily      . amoxicillin (AMOXIL) 500 MG capsule Take 4 capsules one hour prior to dental appointment      . aspirin 81 MG tablet Take 81 mg by mouth daily.        . calcitonin, salmon, (MIACALCIN/FORTICAL) 200 UNIT/ACT nasal spray Place 1 spray into the nose daily.        . cetaphil (CETAPHIL) lotion Use as directed      . cloNIDine (CATAPRES) 0.1 MG tablet Take 0.1 mg by mouth every 4 (four) hours as needed.        Marland Kitchen levothyroxine (LEVOTHROID) 25 MCG tablet Brand only  30 tablet  11  . metoprolol (LOPRESSOR) 50 MG tablet Take 50 mg by mouth 2 (two) times daily.      . mirabegron ER (MYRBETRIQ) 50 MG TB24 Take 50 mg by mouth daily.      . mirtazapine (REMERON) 7.5 MG tablet Take by mouth  Daily.      . Multiple Vitamin (MULTIVITAMIN) tablet Take 1 tablet by mouth daily.      . Multiple Vitamins-Minerals (PRESERVISION AREDS PO) Take 1 capsule by mouth. Twice a day      . nitroGLYCERIN (NITROSTAT) 0.4 MG SL tablet Place 0.4 mg under the tongue every 5 (five) minutes as needed.        Bertram Gala Glycol-Propyl Glycol (SYSTANE OP) Apply to eye. As directed      . Polyethylene Glycol 3350 (MIRALAX PO) Take by mouth daily.        Marland Kitchen senna (SENOKOT) 8.6 MG tablet Take 2 tablets by mouth daily.      Marland Kitchen triamcinolone cream (KENALOG) 0.1 % Apply topically every morning.      Marland Kitchen losartan (COZAAR) 50 MG tablet Take 1 tablet (50 mg total) by mouth daily.  30 tablet  6   No current facility-administered medications for this visit.    Allergies  Allergen Reactions  . Lipitor [Atorvastatin Calcium]     MUSCLE ACHES  . Micardis [Telmisartan]   . Other     Adhesive tape,chocolate,citrus,berriespaper tape    Patient Active Problem List   Diagnosis Date Noted  . Coronary artery disease 03/13/2011    Priority:  High  . Benign hypertensive heart disease without heart failure 03/13/2011    Priority: High  . Unspecified disorder of joint of pelvic region and thigh 06/06/2013  . Onychogryphosis 05/30/2013  . Pain of left great toe 05/30/2013  . Thoracic back pain 05/02/2013  . Urinary incontinence 03/01/2013  . Abnormality of gait 03/01/2013  . Hypertension   . Aortic stenosis 02/09/2012  . Macular degeneration (senile) of retina, unspecified 09/24/2011  . Nocturia 09/24/2011  . Weight loss, unintentional 09/12/2011  . Hypothyroidism 03/13/2011  . Hypercholesterolemia 03/13/2011  . Memory deficit 03/02/2011    History  Smoking status  . Former Smoker  . Quit date: 03/11/1981  Smokeless tobacco  . Never Used    History  Alcohol Use No    Family History  Problem Relation Age of Onset  . Heart disease Mother   . Heart attack Father   . Cancer Sister   . Cancer Brother      Review of Systems: Constitutional: no fever chills diaphoresis or fatigue or change in weight.  Head and neck: no hearing loss, no epistaxis, no photophobia or visual disturbance. Respiratory: No cough, shortness of breath or wheezing. Cardiovascular: No chest pain peripheral edema, palpitations. Gastrointestinal: No abdominal distention, no abdominal pain, no change in bowel habits hematochezia or melena. Genitourinary: No dysuria, no frequency, no urgency, no nocturia. Musculoskeletal:No arthralgias, no back pain, no gait disturbance or myalgias. Neurological: No dizziness, no headaches, no numbness, no seizures, no syncope, no weakness, no tremors. Hematologic: No lymphadenopathy, no easy bruising. Psychiatric: No confusion, no hallucinations, no sleep disturbance.    Physical Exam: Filed Vitals:   09/26/13 1600  BP: 175/80  Pulse:    the general appearance reveals a well-developed elderly woman in no distress.The head and neck exam reveals pupils equal and reactive.  Extraocular movements are full.  There is no scleral icterus.  The mouth and pharynx are normal.  The neck is supple.  The carotids reveal no bruits.  The jugular venous pressure is normal.  The  thyroid is not enlarged.  There is no lymphadenopathy.  The chest is clear to percussion and auscultation.  There are no rales or rhonchi.  Expansion of the chest is symmetrical.  The precordium is quiet.  The first heart sound is normal.  The second heart sound is physiologically split.  There is no murmur gallop rub or click.  There is no abnormal lift or heave.  The abdomen is soft and nontender.  The bowel sounds are normal.  The liver and spleen are not enlarged.  There are no abdominal masses.  There are no abdominal bruits.  Extremities reveal good pedal pulses.  There is no phlebitis or edema.  There is no cyanosis or clubbing.  Strength is normal and symmetrical in all extremities.  There is no lateralizing weakness.  There  are no sensory deficits.  The skin is warm and dry.  There is no rash.     Assessment / Plan: Continue same medication except we will increase losartan back to 50 mg daily because of her significant systolic.  She should continue to exercise great care in getting up from a chair and I encouraged her to continue to use her walker for balance. Keep other medicines the same and recheck in 4 months. We'll defer management of her infected minimally painful tooth to her PCP Dr. Chilton Si

## 2013-09-26 NOTE — Assessment & Plan Note (Signed)
The patient has a history of moderate aortic stenosis by echocardiogram.  She is not an operative candidate because of her age and comorbidities.  Continue conservative treatment.

## 2013-09-26 NOTE — Assessment & Plan Note (Signed)
The patient has a history of significant systolic hypertension.  She complains of dizziness when she stands up even though she stands up the liver only and very slowly. We checked orthostatics on her today myself.  In the right arm her blood pressure was 175/80 sitting and also standing.  When she had first arrived her systolic pressure was 199.  She is on losartan 25 mg daily and we will increase this to 50 mg daily in an effort to get better blood pressure control.  I think her dizziness is related to her systolic hypertension.  We did not demonstrate any orthostatic hypotension today.

## 2013-09-26 NOTE — Assessment & Plan Note (Signed)
The patient has not had any recurrent chest pain or angina. 

## 2013-09-28 ENCOUNTER — Encounter: Payer: Self-pay | Admitting: Geriatric Medicine

## 2013-09-28 ENCOUNTER — Non-Acute Institutional Stay: Payer: Medicare Other | Admitting: Geriatric Medicine

## 2013-09-28 VITALS — BP 116/74 | HR 64 | Temp 95.2°F | Wt 113.0 lb

## 2013-09-28 DIAGNOSIS — K047 Periapical abscess without sinus: Secondary | ICD-10-CM

## 2013-09-28 NOTE — Assessment & Plan Note (Signed)
Tooth infection identified by patient's dentist, recommended extraction. Patient wants to avoid this if possible. There is no evidence of infection patient's mouth today. Recommend close observation if any sign of infection occurs we'll treat with an antibiotic and recommended and recommended extraction. Discussed this plan with patient's nephew, he agrees.

## 2013-09-28 NOTE — Progress Notes (Signed)
  Patient ID: Kendra Tapia, female   DOB: August 23, 1912, 79 y.o.   MRN: [id number]  [facility] [phone])  Code Status: DNR  Contact Information   Name Relation Home Work Mobile   Tapia,Kendra Relative [phone]         Chief Complaint  Patient presents with  . infected tooth    bottom right    HPI: This is a 79 y.o. female resident of [facility], Assisted Living    section.  Evaluation is requested today due to infected tooth.  Patient was evaluated by her dentist Dr. Maisie Fus for routine check. X-ray revealed infected tooth, #31. She recommended evaluation by oral surgeon. Patient's son patient's nephew talk to his dentist, Dr.Scott Tapia who felt oral surgery not really necessary, he could pull the tooth if needed. Patient does not want tooth removed if at all possible. She is not experiencing any pain with or without chewing, having no difficulty eating.    Allergies  Allergen Reactions  . Lipitor [Atorvastatin Calcium]     MUSCLE ACHES  . Micardis [Telmisartan]   . Other     Adhesive tape,chocolate,citrus,berriespaper tape   Medications Reviewed   Review of Systems  DATA OBTAINED: from patient, family member GENERAL: Feels well   No fevers, fatigue, change in appetite or weight MOUTH/THROAT: No mouth or tooth pain   No sore throat    No difficulty chewing or swallowing RESPIRATORY: No cough, wheezing, SOB CARDIAC: No chest pain, palpitations  No edema. GI: No abdominal pain  No N/V/D or constipation  No heartburn or reflux  GU: No dysuria, frequency or urgency  No change in urine volume or character PSYCHIATRIC: No feelings of anxiety, depression Sleeps well.  No behavior issue.    Physical Exam Filed Vitals:   09/28/13 1313  BP: 116/74  Pulse: 64  Temp: 95.2 F (35.1 C)  TempSrc: Oral  Weight: 113 lb (51.256 kg)   Body mass index is 22.81 kg/(m^2).  GENERAL APPEARANCE: No acute distress, appropriately  groomed, frail body habitus. Alert, pleasant, conversant. EYES: Conjunctiva/lids clear.  EARS: Poor Hearing . NOSE: No deformity or discharge. MOUTH/THROAT: Lips w/o lesions. Oral mucosa, tongue moist, w/o lesion. Oropharynx w/o redness or lesions. Gums are pink, no evidence of edema erythema or drainage around any tooth. RESPIRATORY: Breathing is even, unlabored. Lung sounds are clear and full.  CARDIOVASCULAR: Heart RRR. No murmur or extra heart sounds MUSCULOSKELETAL:. Gait is steady w/ walker NEUROLOGIC: Oriented to time, place, person. Speech clear, no tremor.  PSYCHIATRIC: Mood and affect appropriate to situation  ASSESSMENT/PLAN  Tooth abscess Tooth infection identified by patient's dentist, recommended extraction. Patient wants to avoid this if possible. There is no evidence of infection patient's mouth today. Recommend close observation if any sign of infection occurs we'll treat with an antibiotic and recommended and recommended extraction. Discussed this plan with patient's nephew, he agrees.   Follow up: As scheduled  Meribeth Vitug T.Lukka Black, NP-C 09/28/2013

## 2013-11-28 ENCOUNTER — Non-Acute Institutional Stay: Payer: Medicare Other | Admitting: Internal Medicine

## 2013-11-28 ENCOUNTER — Encounter: Payer: Self-pay | Admitting: Internal Medicine

## 2013-11-28 VITALS — BP 122/68 | HR 64 | Ht 59.0 in | Wt 105.0 lb

## 2013-11-28 DIAGNOSIS — E039 Hypothyroidism, unspecified: Secondary | ICD-10-CM

## 2013-11-28 DIAGNOSIS — I10 Essential (primary) hypertension: Secondary | ICD-10-CM

## 2013-11-28 DIAGNOSIS — I2510 Atherosclerotic heart disease of native coronary artery without angina pectoris: Secondary | ICD-10-CM

## 2013-11-28 DIAGNOSIS — R413 Other amnesia: Secondary | ICD-10-CM

## 2013-11-28 NOTE — Progress Notes (Signed)
Patient ID: Kendra Tapia, female   DOB: 09-12-1912, 80 y.o.   MRN: [id number]    Location:  [facility] (12)    Allergies  Allergen Reactions  . Lipitor [Atorvastatin Calcium]     MUSCLE ACHES  . Micardis [Telmisartan]   . Other     Adhesive tape,chocolate,citrus,berriespaper tape    Chief Complaint  Patient presents with  . Medical Managment of Chronic Issues    blood pressure, thyroid, memory, CAD    HPI:   Hypertension: controlled  Hypothyroidism: needs recheck  Coronary artery disease: denies chest pain or palpitations  Memory deficit: slow progression      Medications: Patient's Medications  New Prescriptions   No medications on file  Previous Medications   ACETAMINOPHEN (TYLENOL) 325 MG TABLET    Take 650 mg by mouth every 6 (six) hours as needed for pain. Take one tablet twice daily   AMOXICILLIN (AMOXIL) 500 MG CAPSULE    Take 4 capsules one hour prior to dental appointment   ASPIRIN 81 MG TABLET    Take 81 mg by mouth daily.     CALCITONIN, SALMON, (MIACALCIN/FORTICAL) 200 UNIT/ACT NASAL SPRAY    Place 1 spray into the nose daily. Alternate nostrils   CLONIDINE (CATAPRES) 0.1 MG TABLET    Take 0.1 mg by mouth every 4 (four) hours as needed.     LEVOTHYROXINE (LEVOTHROID) 25 MCG TABLET    Brand only   LOSARTAN (COZAAR) 50 MG TABLET    Take 1 tablet (50 mg total) by mouth daily.   METOPROLOL (LOPRESSOR) 50 MG TABLET    Take 50 mg by mouth 2 (two) times daily.   MIRABEGRON ER (MYRBETRIQ) 50 MG TB24    Take 50 mg by mouth daily.   MIRTAZAPINE (REMERON) 7.5 MG TABLET    Take by mouth Daily.   MULTIPLE VITAMIN (MULTIVITAMIN) TABLET    Take 1 tablet by mouth daily.   MULTIPLE VITAMINS-MINERALS (PRESERVISION AREDS PO)    Take 1 capsule by mouth. Twice a day   NITROGLYCERIN (NITROSTAT) 0.4 MG SL TABLET    Place 0.4 mg under the tongue every 5 (five) minutes as needed.     POLYETHYL GLYCOL-PROPYL GLYCOL (SYSTANE  OP)    Apply to eye. As directed   POLYETHYLENE GLYCOL 3350 (MIRALAX PO)    Take by mouth daily.     SENNA (SENOKOT) 8.6 MG TABLET    Take 2 tablets by mouth daily.   TRIAMCINOLONE CREAM (KENALOG) 0.1 %    Apply topically every morning.  Modified Medications   No medications on file  Discontinued Medications   No medications on file     Review of Systems  Constitutional: Negative.  Negative for fever.  HENT: Negative.   Eyes: Negative.   Respiratory: Negative.   Cardiovascular: Negative for chest pain and leg swelling.  Gastrointestinal: Negative.   Endocrine: Negative.   Genitourinary: Negative.   Musculoskeletal: Positive for back pain and gait problem.       Right hip and leg pains  Skin: Negative for pallor and rash.  Neurological: Negative.   Hematological: Negative.   Psychiatric/Behavioral: Negative for behavioral problems and sleep disturbance. The patient is not nervous/anxious.        Memory loss    Filed Vitals:   11/28/13 1428  BP: 122/68  Pulse: 64  Height: _0  (1.499 m)  Weight: 105 lb (47.628 kg)   Physical Exam  Constitutional: She  is oriented to person, place, and time. She appears well-developed and well-nourished. No distress.  HENT:  Head: Normocephalic and atraumatic.  Right Ear: External ear normal.  Left Ear: External ear normal.  Nose: Nose normal.  Mouth/Throat: Oropharynx is clear and moist.  Eyes: Conjunctivae and EOM are normal. Pupils are equal, round, and reactive to light.  Neck: No JVD present. No tracheal deviation present. No thyromegaly present.  Cardiovascular: Normal rate, regular rhythm, normal heart sounds and intact distal pulses.  Exam reveals no gallop and no friction rub.   No murmur heard. Pulmonary/Chest: Breath sounds normal. No respiratory distress. She has no wheezes. She has no rales. She exhibits no tenderness.  Abdominal: She exhibits no distension and no mass. There is no tenderness.  Musculoskeletal: Normal  range of motion. She exhibits tenderness. She exhibits no edema.  Tender in the right hip. Uses walker.  Lymphadenopathy:    She has no cervical adenopathy.  Neurological: She is alert and oriented to person, place, and time.  Memory loss  Skin: No rash noted. No erythema. No pallor.  Psychiatric: She has a normal mood and affect. Her behavior is normal.     Labs reviewed: No visits with results within 3 Month(s) from this visit. Latest known visit with results is:  Admission on 05/12/2011, Discharged on 05/16/2011  Component Date Value Ref Range Status  . Sodium 05/12/2011 133* 135 - 145 mEq/L Final  . Potassium 05/12/2011 3.6  3.5 - 5.1 mEq/L Final  . Chloride 05/12/2011 100  96 - 112 mEq/L Final  . BUN 05/12/2011 32* 6 - 23 mg/dL Final  . Creatinine, Ser 05/12/2011 2.00* 0.50 - 1.10 mg/dL Final  . Glucose, Bld 05/12/2011 108* 70 - 99 mg/dL Final  . Calcium, Ion 05/12/2011 1.14  1.12 - 1.32 mmol/L Final  . TCO2 05/12/2011 22  0 - 100 mmol/L Final  . Hemoglobin 05/12/2011 11.6* 12.0 - 15.0 g/dL Final  . HCT 05/12/2011 34.0* 36.0 - 46.0 % Final  . WBC 05/12/2011 13.1* 4.0 - 10.5 K/uL Final  . RBC 05/12/2011 3.61* 3.87 - 5.11 MIL/uL Final  . Hemoglobin 05/12/2011 11.3* 12.0 - 15.0 g/dL Final  . HCT 05/12/2011 33.1* 36.0 - 46.0 % Final  . MCV 05/12/2011 91.7  78.0 - 100.0 fL Final  . MCH 05/12/2011 31.3  26.0 - 34.0 pg Final  . MCHC 05/12/2011 34.1  30.0 - 36.0 g/dL Final  . RDW 05/12/2011 14.1  11.5 - 15.5 % Final  . Platelets 05/12/2011 226  150 - 400 K/uL Final  . Color, Urine 05/12/2011 AMBER* YELLOW Final   BIOCHEMICALS MAY BE AFFECTED BY COLOR  . APPearance 05/12/2011 CLOUDY* CLEAR Final  . Specific Gravity, Urine 05/12/2011 1.027  1.005 - 1.030 Final  . pH 05/12/2011 5.0  5.0 - 8.0 Final  . Glucose, UA 05/12/2011 NEGATIVE  NEGATIVE mg/dL Final  . Hgb urine dipstick 05/12/2011 NEGATIVE  NEGATIVE Final  . Bilirubin Urine 05/12/2011 MODERATE* NEGATIVE Final  . Ketones,  ur 05/12/2011 TRACE* NEGATIVE mg/dL Final  . Protein, ur 05/12/2011 30* NEGATIVE mg/dL Final  . Urobilinogen, UA 05/12/2011 1.0  0.0 - 1.0 mg/dL Final  . Nitrite 05/12/2011 NEGATIVE  NEGATIVE Final  . Leukocytes, UA 05/12/2011 SMALL* NEGATIVE Final  . Squamous Epithelial / LPF 05/12/2011 FEW* RARE Final  . WBC, UA 05/12/2011 7-10  <3 WBC/hpf Final  . Bacteria, UA 05/12/2011 FEW* RARE Final  . Urine-Other 05/12/2011 MUCOUS PRESENT   Final  . TSH 05/12/2011 2.408  0.350 - 4.500 uIU/mL Final  . Sodium 05/13/2011 132* 135 - 145 mEq/L Final  . Potassium 05/13/2011 3.8  3.5 - 5.1 mEq/L Final  . Chloride 05/13/2011 99  96 - 112 mEq/L Final  . CO2 05/13/2011 19  19 - 32 mEq/L Final  . Glucose, Bld 05/13/2011 82  70 - 99 mg/dL Final  . BUN 05/13/2011 26* 6 - 23 mg/dL Final  . Creatinine, Ser 05/13/2011 1.32* 0.50 - 1.10 mg/dL Final   Comment: DELTA CHECK NOTED                          RESULT REPEATED AND VERIFIED  . Calcium 05/13/2011 8.9  8.4 - 10.5 mg/dL Final  . GFR calc non Af Amer 05/13/2011 37* >60 mL/min Final  . GFR calc Af Amer 05/13/2011 45* >60 mL/min Final   Comment:                                 The eGFR has been calculated                          using the MDRD equation.                          This calculation has not been                          validated in all clinical                          situations.                          eGFR's persistently                          <60 mL/min signify                          possible Chronic Kidney Disease.  Marland Kitchen Specimen Description 05/12/2011 URINE, CATHETERIZED   Final  . Special Requests 05/12/2011 NONE   Final  . Culture  Setup Time 05/12/2011 [id number]   Final  . Colony Count 05/12/2011 15,000 COLONIES/ML   Final  . Culture 05/12/2011 Multiple bacterial morphotypes present, none predominant. Suggest appropriate recollection if clinically indicated.   Final  . Report Status 05/12/2011 05/13/2011 FINAL   Final  . WBC  05/14/2011 7.7  4.0 - 10.5 K/uL Final  . RBC 05/14/2011 3.31* 3.87 - 5.11 MIL/uL Final  . Hemoglobin 05/14/2011 10.4* 12.0 - 15.0 g/dL Final  . HCT 05/14/2011 30.2* 36.0 - 46.0 % Final  . MCV 05/14/2011 91.2  78.0 - 100.0 fL Final  . MCH 05/14/2011 31.4  26.0 - 34.0 pg Final  . MCHC 05/14/2011 34.4  30.0 - 36.0 g/dL Final  . RDW 05/14/2011 14.0  11.5 - 15.5 % Final  . Platelets 05/14/2011 251  150 - 400 K/uL Final  . Prothrombin Time 05/14/2011 14.2  11.6 - 15.2 seconds Final  . INR 05/14/2011 1.08  0.00 - 1.49 Final  . aPTT 05/14/2011 36  24 - 37 seconds Final  . Sodium 05/14/2011 135  135 - 145 mEq/L Final  .  Potassium 05/14/2011 3.3* 3.5 - 5.1 mEq/L Final  . Chloride 05/14/2011 103  96 - 112 mEq/L Final  . CO2 05/14/2011 22  19 - 32 mEq/L Final  . Glucose, Bld 05/14/2011 100* 70 - 99 mg/dL Final  . BUN 05/14/2011 17  6 - 23 mg/dL Final  . Creatinine, Ser 05/14/2011 0.72  0.50 - 1.10 mg/dL Final   Comment: RESULT REPEATED AND VERIFIED                          DELTA CHECK NOTED  . Calcium 05/14/2011 8.8  8.4 - 10.5 mg/dL Final  . GFR calc non Af Amer 05/14/2011 >60  >60 mL/min Final  . GFR calc Af Amer 05/14/2011 >60  >60 mL/min Final   Comment:                                 The eGFR has been calculated                          using the MDRD equation.                          This calculation has not been                          validated in all clinical                          situations.                          eGFR's persistently                          <60 mL/min signify                          possible Chronic Kidney Disease.  Marland Kitchen TSH 05/14/2011 2.384  0.350 - 4.500 uIU/mL Final  . Sodium 05/15/2011 133* 135 - 145 mEq/L Final  . Potassium 05/15/2011 3.9  3.5 - 5.1 mEq/L Final  . Chloride 05/15/2011 102  96 - 112 mEq/L Final  . CO2 05/15/2011 20  19 - 32 mEq/L Final  . Glucose, Bld 05/15/2011 98  70 - 99 mg/dL Final  . BUN 05/15/2011 10  6 - 23 mg/dL Final  .  Creatinine, Ser 05/15/2011 0.66  0.50 - 1.10 mg/dL Final  . Calcium 05/15/2011 9.3  8.4 - 10.5 mg/dL Final  . GFR calc non Af Amer 05/15/2011 >60  >60 mL/min Final  . GFR calc Af Amer 05/15/2011 >60  >60 mL/min Final   Comment:                                 The eGFR has been calculated                          using the MDRD equation.                          This calculation has not been  validated in all clinical                          situations.                          eGFR's persistently                          <60 mL/min signify                          possible Chronic Kidney Disease.  . Iron 05/15/2011 36* 42 - 135 ug/dL Final  . TIBC 05/15/2011 203* 250 - 470 ug/dL Final  . Saturation Ratios 05/15/2011 18* 20 - 55 % Final  . UIBC 05/15/2011 167   Final  . Vitamin B-12 05/15/2011 1898* 211 - 911 pg/mL Final  . Ferritin 05/15/2011 532* 10 - 291 ng/mL Final  . Folate 05/15/2011 >20.0   Final   Comment: (NOTE)                          Reference Ranges                                 Deficient:       0.4 - 3.3 ng/mL                                 Indeterminate:   3.4 - 5.4 ng/mL                                 Normal:              > 5.4 ng/mL   01/11/13 CBC: hgb 10.9, MCV 92.9  BMP nl  Lipids: TC 176, trig 52, HDL 53, LDL 114  08/25/13 CBC: Hgb 9.8, MCV 91.7  CMP: BUN 24, Creat 1.16   TSH 3.390   Assessment/Plan  1. Hypertension controlled  2. Hypothyroidism Controlled. Recheck soon.  3. Coronary artery disease No pain or palpitations  4. Memory deficit Gradually worsening

## 2013-11-28 NOTE — Patient Instructions (Signed)
Continue current medications. 

## 2013-11-30 LAB — TSH: TSH: 3.93 u[IU]/mL (ref 0.41–5.90)

## 2013-11-30 LAB — HEPATIC FUNCTION PANEL
ALK PHOS: 68 U/L (ref 25–125)
ALT: 8 U/L (ref 7–35)
AST: 17 U/L (ref 13–35)
BILIRUBIN, TOTAL: 0.5 mg/dL

## 2013-11-30 LAB — BASIC METABOLIC PANEL
BUN: 20 mg/dL (ref 4–21)
Creatinine: 1 mg/dL (ref 0.5–1.1)
Glucose: 91 mg/dL
Potassium: 4.8 mmol/L (ref 3.4–5.3)
Sodium: 138 mmol/L (ref 137–147)

## 2014-01-27 ENCOUNTER — Encounter: Payer: Self-pay | Admitting: Cardiology

## 2014-01-27 ENCOUNTER — Ambulatory Visit (INDEPENDENT_AMBULATORY_CARE_PROVIDER_SITE_OTHER): Payer: Medicare Other | Admitting: Cardiology

## 2014-01-27 VITALS — BP 116/54 | HR 56 | Ht 59.0 in | Wt 106.0 lb

## 2014-01-27 DIAGNOSIS — I359 Nonrheumatic aortic valve disorder, unspecified: Secondary | ICD-10-CM

## 2014-01-27 DIAGNOSIS — I119 Hypertensive heart disease without heart failure: Secondary | ICD-10-CM

## 2014-01-27 DIAGNOSIS — I350 Nonrheumatic aortic (valve) stenosis: Secondary | ICD-10-CM

## 2014-01-27 DIAGNOSIS — E7800 Pure hypercholesterolemia, unspecified: Secondary | ICD-10-CM

## 2014-01-27 DIAGNOSIS — R634 Abnormal weight loss: Secondary | ICD-10-CM

## 2014-01-27 DIAGNOSIS — I2510 Atherosclerotic heart disease of native coronary artery without angina pectoris: Secondary | ICD-10-CM

## 2014-01-27 NOTE — Assessment & Plan Note (Signed)
Since last visit the patient has not had any recurrent chest pain or angina.  She has nitroglycerin on hand but has not had to take any.

## 2014-01-27 NOTE — Progress Notes (Signed)
Orvan Falconer Date of Birth:  12-04-1911 [address] [place], [place]  [address] [phone]         Fax   [phone]  History of Present Illness: This pleasant 80 year old woman is seen for a four-month followup office visit. She has a history of ischemic heart disease and a history of aortic stenosis and a history of essential hypertension. She also has hypothyroidism and hypercholesterolemia. Now lives in assisted living at [facility]. She no longer drives her car. She has not been experiencing any prolonged chest discomfort. She has occasional brief chest pain which does not require sublingual nitroglycerin. She has a known murmur of mild aortic stenosis. Since last visit she has been doing well with no new cardiac complaints. .  she had some recent lab work at [facility] on 11/30/13 which was satisfactory including thyroid function. Current Outpatient Prescriptions  Medication Sig Dispense Refill  . acetaminophen (TYLENOL) 325 MG tablet Take 650 mg by mouth every 6 (six) hours as needed for pain. Take one tablet twice daily      . amoxicillin (AMOXIL) 500 MG capsule Take 4 capsules one hour prior to dental appointment      . aspirin 81 MG tablet Take 81 mg by mouth daily.        . calcitonin, salmon, (MIACALCIN/FORTICAL) 200 UNIT/ACT nasal spray Place 1 spray into the nose daily. Alternate nostrils      . cloNIDine (CATAPRES) 0.1 MG tablet Take 0.1 mg by mouth every 4 (four) hours as needed.        Marland Kitchen levothyroxine (LEVOTHROID) 25 MCG tablet Brand only  30 tablet  11  . losartan (COZAAR) 50 MG tablet Take 1 tablet (50 mg total) by mouth daily.  30 tablet  6  . metoprolol (LOPRESSOR) 50 MG tablet Take 50 mg by mouth 2 (two) times daily.      . mirabegron ER (MYRBETRIQ) 50 MG TB24 Take 50 mg by mouth daily.      . mirtazapine (REMERON) 7.5 MG tablet Take by mouth Daily.      . Multiple Vitamin (MULTIVITAMIN) tablet Take 1 tablet by mouth daily.      . Multiple  Vitamins-Minerals (PRESERVISION AREDS PO) Take 1 capsule by mouth. Twice a day      . nitroGLYCERIN (NITROSTAT) 0.4 MG SL tablet Place 0.4 mg under the tongue every 5 (five) minutes as needed.        Vladimir Faster Glycol-Propyl Glycol (SYSTANE OP) Apply to eye. As directed      . Polyethylene Glycol 3350 (MIRALAX PO) Take by mouth daily.        Marland Kitchen senna (SENOKOT) 8.6 MG tablet Take 2 tablets by mouth daily.      Marland Kitchen triamcinolone cream (KENALOG) 0.1 % Apply topically every morning.       No current facility-administered medications for this visit.    Allergies  Allergen Reactions  . Lipitor [Atorvastatin Calcium]     MUSCLE ACHES  . Micardis [Telmisartan]   . Other     Adhesive tape,chocolate,citrus,berriespaper tape    Patient Active Problem List   Diagnosis Date Noted  . Coronary artery disease 03/13/2011    Priority: High  . Benign hypertensive heart disease without heart failure 03/13/2011    Priority: High  . Tooth abscess 09/28/2013  . Unspecified disorder of joint of pelvic region and thigh 06/06/2013  . Onychogryphosis 05/30/2013  . Pain of left great toe 05/30/2013  . Thoracic back pain  05/02/2013  . Urinary incontinence 03/01/2013  . Abnormality of gait 03/01/2013  . Hypertension   . Aortic stenosis 02/09/2012  . Macular degeneration (senile) of retina, unspecified 09/24/2011  . Nocturia 09/24/2011  . Weight loss, unintentional 09/12/2011  . Hypothyroidism 03/13/2011  . Hypercholesterolemia 03/13/2011  . Memory deficit 03/02/2011    History  Smoking status  . Former Smoker  . Quit date: 03/11/1981  Smokeless tobacco  . Never Used    History  Alcohol Use No    Family History  Problem Relation Age of Onset  . Heart disease Mother   . Heart attack Father   . Cancer Sister   . Cancer Brother     Review of Systems: Constitutional: no fever chills diaphoresis or fatigue or change in weight.  Head and neck: no hearing loss, no epistaxis, no photophobia or  visual disturbance. Respiratory: No cough, shortness of breath or wheezing. Cardiovascular: No chest pain peripheral edema, palpitations. Gastrointestinal: No abdominal distention, no abdominal pain, no change in bowel habits hematochezia or melena. Genitourinary: No dysuria, no frequency, no urgency, no nocturia. Musculoskeletal:No arthralgias, no back pain, no gait disturbance or myalgias. Neurological: No dizziness, no headaches, no numbness, no seizures, no syncope, no weakness, no tremors. Hematologic: No lymphadenopathy, no easy bruising. Psychiatric: No confusion, no hallucinations, no sleep disturbance.    Physical Exam: Filed Vitals:   01/27/14 1330  BP: 116/54  Pulse: 56   the general appearance reveals a well-developed elderly woman in no distress.The head and neck exam reveals pupils equal and reactive.  Extraocular movements are full.  There is no scleral icterus.  The mouth and pharynx are normal.  The neck is supple.  The carotids reveal no bruits.  The jugular venous pressure is normal.  The  thyroid is not enlarged.  There is no lymphadenopathy.  The chest is clear to percussion and auscultation.  There are no rales or rhonchi.  Expansion of the chest is symmetrical.  The precordium is quiet.  The first heart sound is normal.  The second heart sound is physiologically split.  There is no murmur gallop rub or click.  There is no abnormal lift or heave.  The abdomen is soft and nontender.  The bowel sounds are normal.  The liver and spleen are not enlarged.  There are no abdominal masses.  There are no abdominal bruits.  Extremities reveal good pedal pulses.  There is no phlebitis or edema.  There is no cyanosis or clubbing.  Strength is normal and symmetrical in all extremities.  There is no lateralizing weakness.  There are no sensory deficits.  The skin is warm and dry.  There is no rash.     Assessment / Plan: Continue same medication.  Recheck in 4 months for followup  office visit. She complains of constipation.  She is already on MiraLax and senna.  I have encouraged her to drink more water.

## 2014-01-27 NOTE — Assessment & Plan Note (Signed)
Patient has a history of hypercholesterolemia.  This is being treated with heart healthy diet.  She does not require statin therapy at her age.  She had previous adverse effects from the Lipitor causing myalgias

## 2014-01-27 NOTE — Patient Instructions (Signed)
Try to increase your water intake  Your physician recommends that you continue on your current medications as directed. Please refer to the Current Medication list given to you today.  Your physician wants you to follow-up in: 4 month You will receive a reminder letter in the mail two months in advance. If you don't receive a letter, please call our office to schedule the follow-up appointment.

## 2014-01-27 NOTE — Assessment & Plan Note (Signed)
The patient is not having any symptoms from her aortic stenosis.  Previous workup has shown that her aortic stenosis is mild.  There are no echoes in [employer] computer.

## 2014-02-22 ENCOUNTER — Encounter: Payer: Self-pay | Admitting: Geriatric Medicine

## 2014-03-01 ENCOUNTER — Encounter: Payer: Self-pay | Admitting: Geriatric Medicine

## 2014-03-01 ENCOUNTER — Non-Acute Institutional Stay: Payer: Medicare Other | Admitting: Geriatric Medicine

## 2014-03-01 VITALS — BP 100/66 | HR 64 | Temp 96.9°F | Ht <= 58 in | Wt 106.0 lb

## 2014-03-01 DIAGNOSIS — M160 Bilateral primary osteoarthritis of hip: Secondary | ICD-10-CM | POA: Insufficient documentation

## 2014-03-01 DIAGNOSIS — I10 Essential (primary) hypertension: Secondary | ICD-10-CM

## 2014-03-01 DIAGNOSIS — M169 Osteoarthritis of hip, unspecified: Secondary | ICD-10-CM

## 2014-03-01 DIAGNOSIS — M1610 Unilateral primary osteoarthritis, unspecified hip: Secondary | ICD-10-CM

## 2014-03-01 DIAGNOSIS — H3530 Unspecified macular degeneration: Secondary | ICD-10-CM

## 2014-03-01 DIAGNOSIS — I2510 Atherosclerotic heart disease of native coronary artery without angina pectoris: Secondary | ICD-10-CM

## 2014-03-01 DIAGNOSIS — E039 Hypothyroidism, unspecified: Secondary | ICD-10-CM

## 2014-03-01 DIAGNOSIS — D649 Anemia, unspecified: Secondary | ICD-10-CM

## 2014-03-01 DIAGNOSIS — R413 Other amnesia: Secondary | ICD-10-CM

## 2014-03-01 MED ORDER — NAPROXEN SODIUM 220 MG PO TABS: 220.0000 mg | ORAL_TABLET | Freq: Every day | ORAL | Status: DC

## 2014-03-01 NOTE — Progress Notes (Signed)
Patient ID: Kendra Tapia, female   DOB: 06-24-1912, 80 y.o.   MRN: [id number]   Kendra Tapia [phone])  Code Status: DNR Contact Information   Name Relation Home Work Mobile   Kendra Tapia Relative [phone]         Chief Complaint  Patient presents with  . Medical Management of Chronic Issues    Comprehensive exam: blood pressure, cholesterol, anemia, depression, memory   . Leg Problem    bilateral leg hurting daily and having difficulty ambulating when out for appointments and activities.     HPI: This is a 80 y.o. female resident of [facility]. This patient has not had a hospitalization, serious illness or injury in the last year.  Last visit 08/2014 Benign hypertensive heart disease without heart failure Blood pressure well controlled on current medication. Update labs Hypertension Blood pressure well controlled on current medication. Update labs Hypothyroidism Update lab, continue daily supplement Memory deficit Slight decline in cognitive and functional status in the last 6 months: Facility record shows patient is sometimes disoriented and forgetful, she is occasionally incontinent of urine and bowel. She does require some assistance with bathing and dressing. Language skills are intact, remains interactive and active in facility activities. Remains appropriate for Assisted Living setting. Repeat MMSE in clock test. Update lab  Since last visit no acute medical issues.  Recent care planning meeting revealed patient continues to be appropriate for Assisted Living level of care. There's been no significant change in her cognition or functional status. She does experience bilateral hip pain and recently is having more difficulty ambulating.  Review of facility record shows that recent blood pressure range 113-162/61-76 pulse 60-74. Patient was evaluated last week by Dr. Mare Ferrari, no medication changes  were recommended.  Patient's weight has been stable last several months. Most recent labs were satisfactory.    Allergies  Allergen Reactions  . Lipitor [Atorvastatin Calcium]     MUSCLE ACHES  . Micardis [Telmisartan]   . Other     Adhesive tape,chocolate,citrus,berriespaper tape    MEDICATIONS -     Medication List       This list is accurate as of: 03/01/14  3:46 PM.  Always use your most recent med list.               acetaminophen 325 MG tablet  Commonly known as:  TYLENOL  Take 650 mg by mouth. Take two three times daily; take 2 tablets as needed for pain     amoxicillin 500 MG capsule  Commonly known as:  AMOXIL  Take 4 capsules one hour prior to dental appointment     aspirin 81 MG tablet  Take 81 mg by mouth daily.     calcitonin (salmon) 200 UNIT/ACT nasal spray  Commonly known as:  MIACALCIN/FORTICAL  Place 1 spray into the nose daily. Alternate nostrils     cloNIDine 0.1 MG tablet  Commonly known as:  CATAPRES  Take 0.1 mg by mouth every 4 (four) hours as needed.     levothyroxine 25 MCG tablet  Commonly known as:  SYNTHROID, LEVOTHROID  Brand only, take one tablet daily     losartan 50 MG tablet  Commonly known as:  COZAAR  Take 1 tablet (50 mg total) by mouth daily.     metoprolol 50 MG tablet  Commonly known as:  LOPRESSOR  Take 50 mg by mouth 2 (two) times daily.     mirabegron ER 50 MG Tb24  tablet  Commonly known as:  MYRBETRIQ  Take 50 mg by mouth daily.     MIRALAX PO  Take by mouth daily.     mirtazapine 7.5 MG tablet  Commonly known as:  REMERON  Take by mouth Daily.     multivitamin tablet  Take 1 tablet by mouth daily.     naproxen sodium 220 MG tablet  Commonly known as:  ALEVE  Take 1 tablet (220 mg total) by mouth daily.     nitroGLYCERIN 0.4 MG SL tablet  Commonly known as:  NITROSTAT  Place 0.4 mg under the tongue every 5 (five) minutes as needed.     PRESERVISION AREDS PO  Take 1 capsule by mouth. Twice a day       senna 8.6 MG tablet  Commonly known as:  SENOKOT  Take 2 tablets by mouth daily.     SYSTANE OP  Apply to eye. As directed     triamcinolone cream 0.1 %  Commonly known as:  KENALOG  Apply topically every morning.         DATA REVIEWED  Radiologic Exams:   Cardiovascular Exams:   Laboratory Studies:  Lab Results  Component Value Date   WBC 4.1 08/25/2013   HGB 9.8* 08/25/2013   HCT 29* 08/25/2013   PLT 195 08/25/2013   MCV 91.7 08/25/2013   MCH 31.3 08/25/2013   Lab Results  Component Value Date   NA 138 11/30/2013   K 4.8 11/30/2013   BUN 20 11/30/2013   CREATININE 1.0 11/30/2013   AST 17 11/30/2013   ALT 8 11/30/2013   ALKPHOS 68 11/30/2013   Lab Results  Component Value Date   VITAMINB12 1898* 05/15/2011   Lab Results  Component Value Date   TSH 3.93 11/30/2013        Past Medical History  Diagnosis Date  . IHD (ischemic heart disease)   . Nausea   . Coronary artery disease   . Hypothyroidism   . Hyperlipidemia   . Dizziness   . Exudative senile macular degeneration of retina 07/28/2012  . Tear film insufficiency, unspecified 07/28/2012  . Dermatophytosis of foot 05/10/2012  . Unspecified constipation 05/10/2012  . Unspecified urinary incontinence 05/10/2012  . Urethrocele(618.03) 02/11/2012  . Female stress incontinence 02/11/2012  . Depressive disorder, not elsewhere classified 11/10/2011  . Other malaise and fatigue 11/10/2011  . Loss of weight 11/10/2011  . Macular degeneration (senile) of retina, unspecified 09/24/2011  . Unspecified disorder of kidney and ureter 09/24/2011  . Pathologic fracture of vertebrae 09/24/2011  . Memory loss 09/24/2011  . Abnormality of gait 09/24/2011  . Nocturia 09/24/2011  . Malignant neoplasm of breast (female), unspecified site 08/13/2011  . Hyposmolality and/or hyponatremia 08/13/2011  . Anemia, unspecified 08/13/2011  . Closed fracture of unspecified part of vertebral column without mention of spinal cord injury  08/13/2011  . Personal history of fall 08/13/2011  . Hypertension   . Memory deficit 03/02/2011     2012: [facility], repeats herself, some difficulkty with sequence of events. Functional status, language skills are  intact.   Past Surgical History  Procedure Laterality Date  . Coronary angioplasty  03/23/94    NORMAL LEFT VENTRICULAR SYSTOLIC FUNCTION, EF 78%  . Av fistula repair    . Hip surgery Left 10/07/2010    Dr. Rodell Perna    . Coronary angioplasty with stent placement      RIGHT CORONARY ARTERY  . Mastectomy Left     left  Family Status  Relation Status Death Age  . Mother Deceased 81  . Father Deceased 17  . Sister Deceased 21  . Brother Deceased 57   History   Social History Narrative   Single never married. 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 Currently resides in assisted living facility at [facility] since 2012. No significant smoking or alcohol history.    Exercise: chair exercise    Patient's nephew, Joneen Roach is main support.   Has living will and DO NOT RESUSCITATE    REVIEW OF SYSTEMS  DATA OBTAINED: from patient, medical record GENERAL: Feels well   No recent fever, fatigue, change in appetite or weight SKIN: No itch, rash or open wounds EYES: No eye pain, dryness or itching  No change in vision. Follows monthly with ophthalmologist, reports vision is "okay" EARS: No earache, tinnitus, change in hearing NOSE: No congestion, drainage or bleeding MOUTH/THROAT: No mouth or tooth pain  No sore throat   No difficulty chewing or swallowing RESPIRATORY: No cough, wheezing, SOB CARDIAC: No chest pain, palpitations  No edema. CHEST/BREASTS: No discomfort, discharge or lumps in breasts GI: No abdominal pain  No nausea, vomiting,diarrhea. Constipation managed w/ laxative  No heartburn or reflux  GU: No dysuria, frequency or urgency  No change in urine volume or character Continues to have nocturia, 2-3/night     MUSCULOSKELETAL: Bilateral hip pain,No swelling  No back pain  No muscle ache, pain, weakness  Gait is steady w/ walker  No recent falls.  NEUROLOGIC: No dizziness, fainting, headache,  No change in mental status.  PSYCHIATRIC: No feelings of anxiety, depression  Sleeps well.  No behavior issue.    PHYSICAL EXAM Filed Vitals:   03/01/14 1443  BP: 100/66  Pulse: 64  Temp: 96.9 F (36.1 C)  TempSrc: Oral  Height: 4\' 10"  (1.473 m)  Weight: 106 lb (48.081 kg)   Body mass index is 22.16 kg/(m^2).  GENERAL APPEARANCE: No acute distress, appropriately groomed, normal body habitus. Alert, pleasant, conversant. SKIN: No diaphoresis, rash, unusual lesions, wounds HEAD: Normocephalic, atraumatic EYES: Conjunctiva/lids clear. Pupils small, round, reactive. EOMs intact.  EARS: External exam WNL, canals clear, TM WNL. Poor Hearing . NOSE: No deformity or discharge. MOUTH/THROAT: Lips w/o lesions. Oral mucosa, tongue moist, w/o lesion. Oropharynx w/o redness or lesions.  NECK: Supple, full ROM. No thyroid tenderness, enlargement or nodule LYMPHATICS: No head, neck or supraclavicular adenopathy RESPIRATORY: Breathing is even, unlabored. Lung sounds are clear and full.  CHEST/BREASTS:  Left mastectomy, no evidence of local recurrence. Right breast without tenderness, mass, discharge CARDIOVASCULAR: Heart RRR. 2/6aortic murmur   ARTERIAL: No carotid or femoral bruit. Carotid, PT pulse 2+.  VENOUS: No varicosities. No venous stasis skin changes  EDEMA: No peripheral edema.  GASTROINTESTINAL: Abdomen is soft, non-tender, not distended w/ normal bowel sounds. No hepatic or splenic enlargement. No mass, ventral or inguinal hernia. MUSCULOSKELETAL: Moves all extremities with full ROM, strength and tone. Back with mild kyphosis, No scoliosis or spinal process tenderness. Mild tenderness bilateral hips, requires assistance on/ of exam table Gait is steady w/walker NEUROLOGIC: Not Oriented to time.  Cranial nerves 2-12 grossly intact, speech clear, no tremor. Patella, brachial DTR 1+. PSYCHIATRIC: Mood and affect appropriate to situation   ASSESSMENT/PLAN  Osteoarthritis of both hips Persistent pain both hips, no recent fall or other injury. Likely arthritis. Receiving Tylenol TID. Add naprosyn in AM.  Hypertension Recent blood pressure readings satisfactory, 113-162/61-76, pulse 60-74. Continue current medication, update lab  Hypothyroidism No signs of hypo-or hyperthyroidism, most recent TSH satisfactory. Continue current dose of supplement  Memory deficit Most recent MMSE 22/30, with deficits noted in orientation, calculation and recall. Failed clock test. Functional status is unchanged; she continues to require some assistance with bathing and dressing, is occasionally incontinent of urine and bowel. Language skills remain intact, she remains interactive and active in facility activities. Family remains involved. This 80 year old woman remains appropriate for Assisted Living level of care  Macular degeneration (senile) of retina, unspecified Patient continues monthly visits with Dr.Antoszyk in [place] [facility]). Vision appears stable   Anemia, unspecified Mild normochromic, normocytic anemia slightly worse at last measure. Update lab    Family/ staff Communication:     Goals of care:   Maintain functional status and quality of life   Labs/tests ordered: CBC, BMP   Follow up: Return in about 3 months (around 06/01/2014) for OV, BP, anemia, memory.  Mardene Celeste, NP-C [facility] [phone]  03/01/2014

## 2014-03-01 NOTE — Assessment & Plan Note (Signed)
Persistent pain both hips, no recent fall or other injury. Likely arthritis. Receiving Tylenol TID. Add naprosyn in AM.

## 2014-03-06 NOTE — Assessment & Plan Note (Signed)
Patient continues monthly visits with Dr.Antoszyk in [place]). Vision appears stable

## 2014-03-06 NOTE — Assessment & Plan Note (Signed)
No signs of hypo-or hyperthyroidism, most recent TSH satisfactory. Continue current dose of supplement

## 2014-03-06 NOTE — Assessment & Plan Note (Signed)
Most recent MMSE 22/30, with deficits noted in orientation, calculation and recall. Failed clock test. Functional status is unchanged; she continues to require some assistance with bathing and dressing, is occasionally incontinent of urine and bowel. Language skills remain intact, she remains interactive and active in facility activities. Family remains involved. This 80 year old woman remains appropriate for Assisted Living level of care

## 2014-03-06 NOTE — Assessment & Plan Note (Signed)
Mild normochromic, normocytic anemia slightly worse at last measure. Update lab

## 2014-03-06 NOTE — Assessment & Plan Note (Addendum)
Recent blood pressure readings satisfactory, 113-162/61-76, pulse 60-74. Continue current medication, update lab

## 2014-05-23 ENCOUNTER — Encounter: Payer: Self-pay | Admitting: Nurse Practitioner

## 2014-05-23 LAB — CBC AND DIFFERENTIAL
HCT: 29 % — AB (ref 36–46)
Hemoglobin: 9.9 g/dL — AB (ref 12.0–16.0)
Platelets: 209 10*3/uL (ref 150–399)
WBC: 4.8 10*3/mL

## 2014-05-23 LAB — BASIC METABOLIC PANEL
BUN: 21 mg/dL (ref 4–21)
CREATININE: 1 mg/dL (ref 0.5–1.1)
Glucose: 108 mg/dL
POTASSIUM: 4.5 mmol/L (ref 3.4–5.3)
Sodium: 134 mmol/L — AB (ref 137–147)

## 2014-05-23 LAB — HEPATIC FUNCTION PANEL
ALK PHOS: 60 U/L (ref 25–125)
ALT: 10 U/L (ref 7–35)
AST: 16 U/L (ref 13–35)
Bilirubin, Total: 0.5 mg/dL

## 2014-05-23 NOTE — Progress Notes (Signed)
This encounter was created in error - please disregard.

## 2014-05-29 ENCOUNTER — Non-Acute Institutional Stay: Payer: Medicare Other | Admitting: Internal Medicine

## 2014-05-29 ENCOUNTER — Encounter: Payer: Self-pay | Admitting: Internal Medicine

## 2014-05-29 VITALS — BP 152/66 | HR 72 | Wt 106.0 lb

## 2014-05-29 DIAGNOSIS — L608 Other nail disorders: Secondary | ICD-10-CM

## 2014-05-29 DIAGNOSIS — M79609 Pain in unspecified limb: Secondary | ICD-10-CM

## 2014-05-29 DIAGNOSIS — I10 Essential (primary) hypertension: Secondary | ICD-10-CM

## 2014-05-29 DIAGNOSIS — E039 Hypothyroidism, unspecified: Secondary | ICD-10-CM

## 2014-05-29 DIAGNOSIS — I2510 Atherosclerotic heart disease of native coronary artery without angina pectoris: Secondary | ICD-10-CM

## 2014-05-29 DIAGNOSIS — L602 Onychogryphosis: Secondary | ICD-10-CM

## 2014-05-29 DIAGNOSIS — M79672 Pain in left foot: Principal | ICD-10-CM

## 2014-05-29 DIAGNOSIS — D649 Anemia, unspecified: Secondary | ICD-10-CM

## 2014-05-29 DIAGNOSIS — M79671 Pain in right foot: Secondary | ICD-10-CM

## 2014-05-29 DIAGNOSIS — R413 Other amnesia: Secondary | ICD-10-CM

## 2014-05-29 NOTE — Progress Notes (Signed)
Patient ID: Kendra Tapia, female   DOB: 02-02-1912, 80 y.o.   MRN: [id number]    Location:  [facility] (12)    Allergies  Allergen Reactions  . Lipitor [Atorvastatin Calcium]     MUSCLE ACHES  . Micardis [Telmisartan]   . Other     Adhesive tape,chocolate,citrus,berriespaper tape    Chief Complaint  Patient presents with  . Medical Management of Chronic Issues    blood pressure, thyroid, memory, CAD  . feet pain    bilateral per AL nurse. Patient states it's her toe nails.    HPI:  Foot pain, bilateral: seems to be mainly in the toes.   Essential hypertension: controlled  Hypothyroidism, unspecified hypothyroidism type: controlled  Coronary artery disease involving native coronary artery of native heart without angina pectoris:stable  Onychogryphosis: painful around multiple toenails  Anemia, unspecified: last hgb 9.9 on 05/23/14  Memory deficit: unchanged    Medications: Patient's Medications  New Prescriptions   No medications on file  Previous Medications   ACETAMINOPHEN (TYLENOL) 325 MG TABLET    Take 650 mg by mouth. Take two three times daily; take 2 tablets as needed for pain   AMOXICILLIN (AMOXIL) 500 MG CAPSULE    Take 4 capsules one hour prior to dental appointment   ASPIRIN 81 MG TABLET    Take 81 mg by mouth daily.     CALCITONIN, SALMON, (MIACALCIN/FORTICAL) 200 UNIT/ACT NASAL SPRAY    Place 1 spray into the nose daily. Alternate nostrils   CLONIDINE (CATAPRES) 0.1 MG TABLET    Take 0.1 mg by mouth every 4 (four) hours as needed.     LEVOTHYROXINE (SYNTHROID, LEVOTHROID) 25 MCG TABLET    Brand only, take one tablet daily   LOSARTAN (COZAAR) 50 MG TABLET    Take 1 tablet (50 mg total) by mouth daily.   METOPROLOL (LOPRESSOR) 50 MG TABLET    Take 50 mg by mouth 2 (two) times daily.   MIRABEGRON ER (MYRBETRIQ) 50 MG TB24    Take 50 mg by mouth daily.   MIRTAZAPINE (REMERON) 7.5 MG TABLET    Take by  mouth Daily.   MULTIPLE VITAMIN (MULTIVITAMIN) TABLET    Take 1 tablet by mouth daily.   MULTIPLE VITAMINS-MINERALS (PRESERVISION AREDS PO)    Take 1 capsule by mouth. Twice a day   NAPROXEN SODIUM (ALEVE) 220 MG TABLET    Take 1 tablet (220 mg total) by mouth daily.   NITROGLYCERIN (NITROSTAT) 0.4 MG SL TABLET    Place 0.4 mg under the tongue every 5 (five) minutes as needed.     POLYETHYL GLYCOL-PROPYL GLYCOL (SYSTANE OP)    Apply to eye. As directed   POLYETHYLENE GLYCOL 3350 (MIRALAX PO)    Take by mouth daily.     SENNA (SENOKOT) 8.6 MG TABLET    Take 2 tablets by mouth daily.   TRIAMCINOLONE CREAM (KENALOG) 0.1 %    Apply topically every morning.  Modified Medications   No medications on file  Discontinued Medications   No medications on file     Review of Systems  Constitutional: Negative.  Negative for fever.  HENT: Negative.   Eyes: Negative.   Respiratory: Negative.   Cardiovascular: Negative for chest pain and leg swelling.  Gastrointestinal: Negative.   Endocrine: Negative.   Genitourinary: Negative.   Musculoskeletal: Positive for back pain and gait problem.       Right hip and leg pains  Skin: Negative for pallor and rash.       Chronic onychogryphosis  Neurological: Negative.   Hematological: Negative.   Psychiatric/Behavioral: Negative for behavioral problems and sleep disturbance. The patient is not nervous/anxious.        Memory loss    Filed Vitals:   05/29/14 1540  BP: 152/66  Pulse: 72  Weight: 106 lb (48.081 kg)   Body mass index is 22.16 kg/(m^2).  Physical Exam  Constitutional: She is oriented to person, place, and time. She appears well-developed and well-nourished. No distress.  HENT:  Head: Normocephalic and atraumatic.  Right Ear: External ear normal.  Left Ear: External ear normal.  Nose: Nose normal.  Mouth/Throat: Oropharynx is clear and moist.  Eyes: Conjunctivae and EOM are normal. Pupils are equal, round, and reactive to light.    Neck: No JVD present. No tracheal deviation present. No thyromegaly present.  Cardiovascular: Normal rate, regular rhythm, normal heart sounds and intact distal pulses.  Exam reveals no gallop and no friction rub.   No murmur heard. Pulmonary/Chest: Breath sounds normal. No respiratory distress. She has no wheezes. She has no rales. She exhibits no tenderness.  Abdominal: She exhibits no distension and no mass. There is no tenderness.  Musculoskeletal: Normal range of motion. She exhibits tenderness. She exhibits no edema.  Tender in the right hip. Uses walker.  Lymphadenopathy:    She has no cervical adenopathy.  Neurological: She is alert and oriented to person, place, and time.  Memory loss  Skin: No rash noted. No erythema. No pallor.  Bilateral onychogryphosis and tenderness at toes and beside the nails  Psychiatric: She has a normal mood and affect. Her behavior is normal.     Labs reviewed: Nursing Home on 05/29/2014  Component Date Value Ref Range Status  . Hemoglobin 05/23/2014 9.9* 12.0 - 16.0 g/dL Final  . HCT 05/23/2014 29* 36 - 46 % Final  . Platelets 05/23/2014 209  150 - 399 K/L Final  . WBC 05/23/2014 4.8   Final  . Glucose 05/23/2014 108   Final  . BUN 05/23/2014 21  4 - 21 mg/dL Final  . Creatinine 05/23/2014 1.0  0.5 - 1.1 mg/dL Final  . Potassium 05/23/2014 4.5  3.4 - 5.3 mmol/L Final  . Sodium 05/23/2014 134* 137 - 147 mmol/L Final  . Alkaline Phosphatase 05/23/2014 60  25 - 125 U/L Final  . ALT 05/23/2014 10  7 - 35 U/L Final  . AST 05/23/2014 16  13 - 35 U/L Final  . Bilirubin, Total 05/23/2014 0.5   Final  Nursing Home on 03/01/2014  Component Date Value Ref Range Status  . Hemoglobin 08/25/2013 9.8* 12.0 - 16.0 g/dL Final  . HCT 08/25/2013 29* 36 - 46 % Final  . Platelets 08/25/2013 195  150 - 399 K/L Final  . WBC 08/25/2013 4.1   Final  . Glucose 08/25/2013 86   Final  . BUN 08/25/2013 24* 4 - 21 mg/dL Final  . Creatinine 08/25/2013 1.2* 0.5 -  1.1 mg/dL Final  . Potassium 08/25/2013 4.3  3.4 - 5.3 mmol/L Final  . Sodium 08/25/2013 138  137 - 147 mmol/L Final  . Alkaline Phosphatase 08/25/2013 70  25 - 125 U/L Final  . ALT 08/25/2013 8  7 - 35 U/L Final  . AST 08/25/2013 15  13 - 35 U/L Final  . Bilirubin, Total 08/25/2013 0.5   Final  . TSH 08/25/2013 3.39  0.41 - 5.90 uIU/mL Final  . Glucose  11/30/2013 91   Final  . BUN 11/30/2013 20  4 - 21 mg/dL Final  . Creatinine 11/30/2013 1.0  0.5 - 1.1 mg/dL Final  . Potassium 11/30/2013 4.8  3.4 - 5.3 mmol/L Final  . Sodium 11/30/2013 138  137 - 147 mmol/L Final  . Alkaline Phosphatase 11/30/2013 68  25 - 125 U/L Final  . ALT 11/30/2013 8  7 - 35 U/L Final  . AST 11/30/2013 17  13 - 35 U/L Final  . Bilirubin, Total 11/30/2013 0.5   Final  . TSH 11/30/2013 3.93  0.41 - 5.90 uIU/mL Final   Assessment/Plan Foot pain, bilateral; mostly related to her toenails  Essential hypertension: controlled  Hypothyroidism, unspecified hypothyroidism type: compensated  Coronary artery disease involving native coronary artery of native heart without angina pectoris: stable  Onychogryphosis: debrided with clippers  Anemia, unspecified: follow lab  Memory deficit: unchanged

## 2014-08-09 ENCOUNTER — Non-Acute Institutional Stay: Payer: Medicare Other | Admitting: Nurse Practitioner

## 2014-08-09 ENCOUNTER — Encounter: Payer: Self-pay | Admitting: Nurse Practitioner

## 2014-08-09 VITALS — BP 104/60 | HR 60 | Wt 105.0 lb

## 2014-08-09 DIAGNOSIS — I2510 Atherosclerotic heart disease of native coronary artery without angina pectoris: Secondary | ICD-10-CM

## 2014-08-09 DIAGNOSIS — I209 Angina pectoris, unspecified: Secondary | ICD-10-CM

## 2014-08-09 DIAGNOSIS — I10 Essential (primary) hypertension: Secondary | ICD-10-CM

## 2014-08-09 DIAGNOSIS — I25119 Atherosclerotic heart disease of native coronary artery with unspecified angina pectoris: Secondary | ICD-10-CM

## 2014-08-09 NOTE — Progress Notes (Signed)
Patient ID: Kendra Tapia, female   DOB: [birth year], 80 y.o.   MRN: [id number]    Nursing Home Location:  [place] [facility] (12)  PCP: Estill Dooms, MD  Allergies  Allergen Reactions  . Lipitor [Atorvastatin Calcium]     MUSCLE ACHES  . Micardis [Telmisartan]   . Other     Adhesive tape,chocolate,citrus,berriespaper tape    Chief Complaint  Patient presents with  . Medical Management of Chronic Issues    blood pressure, memory  . Chest Pain    per AL nurse, this am BP 138/73, pulse 83,  given Nitro resolved chest pain, issues of nausea and vomiting yesterday . BP continues to fluctuate    HPI:  Patient is a 80 y.o. female seen today at [facility]. Pt is resident of Assisted Living being seen today for blood pressure. Pt with a pmh of CAD, hypothroidism, hyperlipidemia, anemia, dementia, OA. Pt appears to be following with cardiology but did not go to August follow up but she does not know why she did not go. Staff reports patient had chest pain this morning. Was given nitro which resolved the pain.  1 week ago norvasc was started due to elevated blood pressures. Pt also taking Cozaar and lopressor for blood pressure. Since this was given blood pressures are ranging from 123-192/70-95. Most blood pressures are over 140/80.  Pt is a poor historian but no reports of chest pains other than this am (she does not remember event) Staff reports she was dizzy after event. Nitro was given and dropped her blood pressure to 80/30. No chest pain, shortness of breath, dizziness, nausea or vomiting or LE edema at this time.   Review of Systems:  Review of Systems  Constitutional: Negative for activity change, appetite change, fatigue and unexpected weight change.  HENT: Negative for congestion and hearing loss.   Eyes: Negative.   Respiratory: Negative for cough and shortness of breath.   Cardiovascular: Positive for  chest pain. Negative for palpitations and leg swelling.       Per HPI  Gastrointestinal: Negative for abdominal pain, diarrhea and constipation.  Genitourinary: Negative for dysuria and difficulty urinating.  Musculoskeletal: Negative for arthralgias and myalgias.  Skin: Negative for color change and wound.  Neurological: Negative for dizziness and weakness.  Psychiatric/Behavioral: Negative for behavioral problems, confusion and agitation.    Past Medical History  Diagnosis Date  . IHD (ischemic heart disease)   . Nausea   . Coronary artery disease   . Hypothyroidism   . Hyperlipidemia   . Dizziness   . Exudative senile macular degeneration of retina 07/28/2012  . Tear film insufficiency, unspecified 07/28/2012  . Dermatophytosis of foot 05/10/2012  . Unspecified constipation 05/10/2012  . Unspecified urinary incontinence 05/10/2012  . Urethrocele(618.03) 02/11/2012  . Female stress incontinence 02/11/2012  . Depressive disorder, not elsewhere classified 11/10/2011  . Other malaise and fatigue 11/10/2011  . Loss of weight 11/10/2011  . Macular degeneration (senile) of retina, unspecified 09/24/2011  . Unspecified disorder of kidney and ureter 09/24/2011  . Pathologic fracture of vertebrae 09/24/2011  . Memory loss 09/24/2011  . Abnormality of gait 09/24/2011  . Nocturia 09/24/2011  . Malignant neoplasm of breast (female), unspecified site 08/13/2011  . Hyposmolality and/or hyponatremia 08/13/2011  . Anemia, unspecified 08/13/2011  . Closed fracture of unspecified part of vertebral column without mention of spinal cord injury 08/13/2011  . Personal history of fall 08/13/2011  .  Hypertension   . Memory deficit 03/02/2011     2012: [facility], repeats herself, some difficulkty with sequence of events. Functional status, language skills are  intact.   Past Surgical History  Procedure Laterality Date  . Coronary angioplasty  03/23/94    NORMAL LEFT VENTRICULAR SYSTOLIC FUNCTION, EF 84%    . Av fistula repair    . Hip surgery Left 10/07/2010    Dr. Rodell Perna    . Coronary angioplasty with stent placement      RIGHT CORONARY ARTERY  . Mastectomy Left     left    Social History:   reports that she quit smoking about 33 years ago. She has never used smokeless tobacco. She reports that she does not drink alcohol or use illicit drugs.  Family History  Problem Relation Age of Onset  . Heart disease Mother   . Heart attack Father   . Cancer Sister   . Cancer Brother     Medications: Patient's Medications  New Prescriptions   No medications on file  Previous Medications   ACETAMINOPHEN (TYLENOL) 325 MG TABLET    Take 650 mg by mouth. Take two three times daily; take 2 tablets as needed for pain   AMLODIPINE (NORVASC) 5 MG TABLET    Take one tablet daily for blood pressure   AMOXICILLIN (AMOXIL) 500 MG CAPSULE    Take 4 capsules one hour prior to dental appointment   ASPIRIN 81 MG TABLET    Take 81 mg by mouth daily.     CALCITONIN, SALMON, (MIACALCIN/FORTICAL) 200 UNIT/ACT NASAL SPRAY    Place 1 spray into the nose daily. Alternate nostrils   CLONIDINE (CATAPRES) 0.1 MG TABLET    Take 0.1 mg by mouth every 4 (four) hours as needed.     LEVOTHYROXINE (SYNTHROID, LEVOTHROID) 25 MCG TABLET    Brand only, take one tablet daily   LOSARTAN (COZAAR) 50 MG TABLET    Take 1 tablet (50 mg total) by mouth daily.   METOPROLOL (LOPRESSOR) 50 MG TABLET    Take 50 mg by mouth 2 (two) times daily.   MIRABEGRON ER (MYRBETRIQ) 50 MG TB24    Take 50 mg by mouth daily.   MIRTAZAPINE (REMERON) 7.5 MG TABLET    Take by mouth Daily.   MULTIPLE VITAMIN (MULTIVITAMIN) TABLET    Take 1 tablet by mouth daily.   MULTIPLE VITAMINS-MINERALS (PRESERVISION AREDS PO)    Take 1 capsule by mouth. Twice a day   NAPROXEN SODIUM (ALEVE) 220 MG TABLET    Take 1 tablet (220 mg total) by mouth daily.   NITROGLYCERIN (NITROSTAT) 0.4 MG SL TABLET    Place 0.4 mg under the tongue every 5 (five) minutes as  needed.     POLYETHYL GLYCOL-PROPYL GLYCOL (SYSTANE OP)    Apply to eye. As directed   POLYETHYLENE GLYCOL 3350 (MIRALAX PO)    Take by mouth daily.     SENNA (SENOKOT) 8.6 MG TABLET    Take 2 tablets by mouth daily.   TRIAMCINOLONE CREAM (KENALOG) 0.1 %    Apply topically every morning.  Modified Medications   No medications on file  Discontinued Medications   No medications on file     Physical Exam: Filed Vitals:   08/09/14 1320  BP: 104/60  Pulse: 60  Weight: 105 lb (47.628 kg)  SpO2: 91%    Physical Exam  Constitutional: She appears well-developed and well-nourished. No distress.  HENT:  Head: Normocephalic and atraumatic.  Eyes: Conjunctivae and EOM are normal. Pupils are equal, round, and reactive to light.  Neck: Normal range of motion. Neck supple.  Cardiovascular: Normal rate and regular rhythm.   Murmur heard. Pulmonary/Chest: Effort normal and breath sounds normal. No respiratory distress.  Abdominal: Soft. Bowel sounds are normal. She exhibits no distension. There is no tenderness.  Musculoskeletal: She exhibits no edema and no tenderness.  Neurological: She is alert.  Memory loss  Skin: Skin is warm and dry.  Psychiatric: She has a normal mood and affect. Cognition and memory are impaired.    Labs reviewed: Basic Metabolic Panel:  Recent Labs  08/25/13 11/30/13 05/23/14  NA 138 138 134*  K 4.3 4.8 4.5  BUN 24* 20 21  CREATININE 1.2* 1.0 1.0   Liver Function Tests:  Recent Labs  08/25/13 11/30/13 05/23/14  AST 15 17 16   ALT 8 8 10   ALKPHOS 70 68 60   No results found for this basename: LIPASE, AMYLASE,  in the last 8760 hours No results found for this basename: AMMONIA,  in the last 8760 hours CBC:  Recent Labs  08/25/13 05/23/14  WBC 4.1 4.8  HGB 9.8* 9.9*  HCT 29* 29*  PLT 195 209   TSH:  Recent Labs  08/25/13 11/30/13  TSH 3.39 3.93   A1C: No results found for this basename: HGBA1C   Lipid Panel: No results found for this  basename: CHOL, HDL, LDLCALC, TRIG, CHOLHDL, LDLDIRECT,  in the last 8760 hours   Assessment/Plan 1. Coronary artery disease involving native coronary artery of native heart with angina pectoris -with chest pain this morning, resolved with nitro X1, will have pt follow up with cardiologist, she is past her follow up date.  -cont Nitro as needed -conts on asa daily 2. Essential hypertension -blood pressure normal at visit, did receive nitro this morning but review of blood pressure shows most blood pressures elevated and pt is receiving clonidine PRN, will increased norvasc to 10 mg daily at this time -will have pt follow up with cardiologist  -keep appt with Dr Nyoka Cowden and to follow up as needed

## 2014-09-05 ENCOUNTER — Ambulatory Visit (INDEPENDENT_AMBULATORY_CARE_PROVIDER_SITE_OTHER): Payer: Medicare Other | Admitting: Cardiology

## 2014-09-05 VITALS — BP 134/62 | HR 74 | Ht 59.0 in | Wt 104.0 lb

## 2014-09-05 DIAGNOSIS — I2583 Coronary atherosclerosis due to lipid rich plaque: Secondary | ICD-10-CM

## 2014-09-05 DIAGNOSIS — I2510 Atherosclerotic heart disease of native coronary artery without angina pectoris: Secondary | ICD-10-CM

## 2014-09-05 DIAGNOSIS — I350 Nonrheumatic aortic (valve) stenosis: Secondary | ICD-10-CM

## 2014-09-05 DIAGNOSIS — I119 Hypertensive heart disease without heart failure: Secondary | ICD-10-CM

## 2014-09-05 NOTE — Assessment & Plan Note (Signed)
Blood pressure has improved since the addition of amlodipine. She is not having any peripheral edema at this point.

## 2014-09-05 NOTE — Assessment & Plan Note (Signed)
The patient has not been experiencing any symptoms from her aortic stenosis.

## 2014-09-05 NOTE — Assessment & Plan Note (Signed)
She has occasional substernal discomfort. She takes an occasional sublingual nitroglycerin.Marland Kitchen

## 2014-09-05 NOTE — Patient Instructions (Signed)
Your physician recommends that you continue on your current medications as directed. Please refer to the Current Medication list given to you today.  Your physician wants you to follow-up in: 6 MONTH OV  You will receive a reminder letter in the mail two months in advance. If you don't receive a letter, please call our office to schedule the follow-up appointment.  

## 2014-09-05 NOTE — Progress Notes (Signed)
Kendra Tapia Date of Birth:  1912-08-10 [place] [address] [place],   [id number] [phone]        Fax   [phone]   History of Present Illness: This pleasant 80 year old woman is seen for a four-month followup office visit. She has a history of ischemic heart disease and a history of aortic stenosis and a history of essential hypertension. She also has hypothyroidism and hypercholesterolemia. Now lives in assisted living at [facility]. She no longer drives her car. She has not been experiencing any prolonged chest discomfort. She has occasional brief chest pain which does not require sublingual nitroglycerin. She has a known murmur of mild aortic stenosis. Since last visit she has been doing well with no new cardiac complaints.  Since we last saw her she celebrated her 89th birthday party in October. She has been feeling well. She had had some problems with elevated blood pressure which has responded to the addition of amlodipine 5 mg daily. Current Outpatient Prescriptions  Medication Sig Dispense Refill  . acetaminophen (TYLENOL) 325 MG tablet Take 650 mg by mouth. Take two three times daily; take 2 tablets as needed for pain    . amLODipine (NORVASC) 5 MG tablet Take one tablet daily for blood pressure    . amoxicillin (AMOXIL) 500 MG capsule Take 4 capsules one hour prior to dental appointment    . aspirin 81 MG tablet Take 81 mg by mouth daily.      . calcitonin, salmon, (MIACALCIN/FORTICAL) 200 UNIT/ACT nasal spray Place 1 spray into the nose daily. Alternate nostrils    . cloNIDine (CATAPRES) 0.1 MG tablet Take 0.1 mg by mouth every 4 (four) hours as needed.      Marland Kitchen levothyroxine (SYNTHROID, LEVOTHROID) 25 MCG tablet Brand only, take one tablet daily    . losartan (COZAAR) 50 MG tablet Take 1 tablet (50 mg total) by mouth daily. 30 tablet 6  . metoprolol (LOPRESSOR) 50 MG tablet Take 50 mg by mouth 2 (two) times daily.    . mirabegron ER  (MYRBETRIQ) 50 MG TB24 Take 50 mg by mouth daily.    . mirtazapine (REMERON) 7.5 MG tablet Take by mouth Daily.    . Multiple Vitamin (MULTIVITAMIN) tablet Take 1 tablet by mouth daily.    . Multiple Vitamins-Minerals (PRESERVISION AREDS PO) Take 1 capsule by mouth. Twice a day    . naproxen sodium (ALEVE) 220 MG tablet Take 1 tablet (220 mg total) by mouth daily.    . nitroGLYCERIN (NITROSTAT) 0.4 MG SL tablet Place 0.4 mg under the tongue every 5 (five) minutes as needed.      Vladimir Faster Glycol-Propyl Glycol (SYSTANE OP) Apply to eye. As directed    . Polyethylene Glycol 3350 (MIRALAX PO) Take by mouth daily.      Marland Kitchen senna (SENOKOT) 8.6 MG tablet Take 2 tablets by mouth daily.    Marland Kitchen triamcinolone cream (KENALOG) 0.1 % Apply topically every morning.     No current facility-administered medications for this visit.    Allergies  Allergen Reactions  . Lipitor [Atorvastatin Calcium]     MUSCLE ACHES  . Micardis [Telmisartan]   . Other     Adhesive tape,chocolate,citrus,berriespaper tape    Patient Active Problem List   Diagnosis Date Noted  . Coronary artery disease 03/13/2011    Priority: High  . Benign hypertensive heart disease without heart failure 03/13/2011    Priority: High  . Foot pain, bilateral 05/29/2014  .  Osteoarthritis of both hips 03/01/2014  . Tooth abscess 09/28/2013  . Unspecified disorder of joint of pelvic region and thigh 06/06/2013  . Onychogryphosis 05/30/2013  . Pain of left great toe 05/30/2013  . Thoracic back pain 05/02/2013  . Urinary incontinence 03/01/2013  . Abnormality of gait 03/01/2013  . Hypertension   . Aortic stenosis 02/09/2012  . Macular degeneration (senile) of retina, unspecified 09/24/2011  . Nocturia 09/24/2011  . Weight loss, unintentional 09/12/2011  . Anemia, unspecified 08/13/2011  . Hypothyroidism 03/13/2011  . Hypercholesterolemia 03/13/2011  . Memory deficit 03/02/2011    History  Smoking status  . Former Smoker  . Quit  date: 03/11/1981  Smokeless tobacco  . Never Used    History  Alcohol Use No    Family History  Problem Relation Age of Onset  . Heart disease Mother   . Heart attack Father   . Cancer Sister   . Cancer Brother     Review of Systems: Constitutional: no fever chills diaphoresis or fatigue or change in weight.  Head and neck: no hearing loss, no epistaxis, no photophobia or visual disturbance. Respiratory: No cough, shortness of breath or wheezing. Cardiovascular: No chest pain peripheral edema, palpitations. Gastrointestinal: No abdominal distention, no abdominal pain, no change in bowel habits hematochezia or melena. Genitourinary: No dysuria, no frequency, no urgency, no nocturia. Musculoskeletal:No arthralgias, no back pain, no gait disturbance or myalgias. Neurological: No dizziness, no headaches, no numbness, no seizures, no syncope, no weakness, no tremors. Hematologic: No lymphadenopathy, no easy bruising. Psychiatric: No confusion, no hallucinations, no sleep disturbance.   Wt Readings from Last 3 Encounters:  09/05/14 104 lb (47.174 kg)  08/09/14 105 lb (47.628 kg)  05/29/14 106 lb (48.081 kg)    Physical Exam: Filed Vitals:   09/05/14 1445  BP: 134/62  Pulse: 74  The patient appears to be in no distress. She is alert and cooperative and looks younger than her stated age 69  Head and neck exam reveals that the pupils are equal and reactive.  The extraocular movements are full.  There is no scleral icterus.  Mouth and pharynx are benign.  No lymphadenopathy.  No carotid bruits.  The jugular venous pressure is normal.  Thyroid is not enlarged or tender.  Chest is clear to percussion and auscultation.  No rales or rhonchi.  Expansion of the chest is symmetrical.  Heart reveals no abnormal lift or heave.  First and second heart sounds are normal.  There is grade 2/6 systolic murmur at apex and also well heard at the base.  The abdomen is soft and nontender.   Bowel sounds are normoactive.  There is no hepatosplenomegaly or mass.  There are no abdominal bruits.  Extremities reveal no phlebitis or edema.  Pedal pulses are good.  There is no cyanosis or clubbing.  Neurologic exam is normal strength and no lateralizing weakness.  No sensory deficits.  Integument reveals no rash    Assessment / Plan: 1.  Hypertensive heart disease without heart failure 2.  Ischemic heart disease status post remote PCI in [facility] many years ago. Rare angina pectoris. 3.  Aortic stenosis , mild to moderate 4.  Hypothyroidism , on medication, clinically euthyroid   Disposition: the patient is doing well for her age. She is living in assisted living. She has help for 2 hours in the morning and 2 hours in the evening. She hopes to be able to stay in the assisted-living area.  Continue current medication.  Recheck here 6 months

## 2014-09-13 ENCOUNTER — Encounter: Payer: Medicare Other | Admitting: Nurse Practitioner

## 2014-09-13 ENCOUNTER — Encounter: Payer: Self-pay | Admitting: Nurse Practitioner

## 2014-09-13 ENCOUNTER — Non-Acute Institutional Stay: Payer: Medicare Other | Admitting: Nurse Practitioner

## 2014-09-13 VITALS — BP 118/60 | HR 68 | Temp 98.3°F | Wt 103.0 lb

## 2014-09-13 DIAGNOSIS — I2510 Atherosclerotic heart disease of native coronary artery without angina pectoris: Secondary | ICD-10-CM

## 2014-09-13 DIAGNOSIS — R233 Spontaneous ecchymoses: Secondary | ICD-10-CM

## 2014-09-13 DIAGNOSIS — F0390 Unspecified dementia without behavioral disturbance: Secondary | ICD-10-CM

## 2014-09-13 DIAGNOSIS — D699 Hemorrhagic condition, unspecified: Secondary | ICD-10-CM

## 2014-09-13 LAB — BASIC METABOLIC PANEL
BUN: 24 mg/dL — AB (ref 4–21)
CREATININE: 1.1 mg/dL (ref 0.5–1.1)
Glucose: 87 mg/dL
Potassium: 4.6 mmol/L (ref 3.4–5.3)
Sodium: 134 mmol/L — AB (ref 137–147)

## 2014-09-13 LAB — CBC AND DIFFERENTIAL
HEMATOCRIT: 27 % — AB (ref 36–46)
Hemoglobin: 8.8 g/dL — AB (ref 12.0–16.0)
PLATELETS: 229 10*3/uL (ref 150–399)
WBC: 4.8 10^3/mL

## 2014-09-13 NOTE — Progress Notes (Addendum)
Patient ID: Kendra Tapia, female   DOB: 1911/11/13, 80 y.o.   MRN: [id number]    Nursing Home Location:  [place] of Service: Clinic (12)  PCP: Estill Dooms, MD  Allergies  Allergen Reactions  . Lipitor [Atorvastatin Calcium]     MUSCLE ACHES  . Micardis [Telmisartan]   . Other     Adhesive tape,chocolate,citrus,berriespaper tape    Chief Complaint  Patient presents with  . Medical Management of Chronic Issues    increase confusion, bruise on left arm    HPI:  Patient is a 80 y.o. female seen today at [facility]. Pt is resident of Assisted Living being seen today for blood pressure. Pt with a pmh of CAD, hypothroidism, hyperlipidemia, anemia, dementia, OA. Pt appears to be following with cardiology but did not go to August follow up but she does not know why she did not go. Staff reports patient had chest pain this morning. Was given nitro which resolved the pain.  1 week ago norvasc was started due to elevated blood pressures. Pt also taking Cozaar and lopressor for blood pressure. Since this was given blood pressures are ranging from 123-192/70-95. Most blood pressures are over 140/80.  Pt is a poor historian but no reports of chest pains other than this am (she does not remember event) Staff reports she was dizzy after event. Nitro was given and dropped her blood pressure to 80/30. No chest pain, shortness of breath, dizziness, nausea or vomiting or LE edema at this time.   Review of Systems:  Review of Systems  Unable to perform ROS: Dementia    Past Medical History  Diagnosis Date  . IHD (ischemic heart disease)   . Nausea   . Coronary artery disease   . Hypothyroidism   . Hyperlipidemia   . Dizziness   . Exudative senile macular degeneration of retina 07/28/2012  . Tear film insufficiency, unspecified 07/28/2012  . Dermatophytosis of foot 05/10/2012  . Unspecified constipation 05/10/2012  .  Unspecified urinary incontinence 05/10/2012  . Urethrocele(618.03) 02/11/2012  . Female stress incontinence 02/11/2012  . Depressive disorder, not elsewhere classified 11/10/2011  . Other malaise and fatigue 11/10/2011  . Loss of weight 11/10/2011  . Macular degeneration (senile) of retina, unspecified 09/24/2011  . Unspecified disorder of kidney and ureter 09/24/2011  . Pathologic fracture of vertebrae 09/24/2011  . Memory loss 09/24/2011  . Abnormality of gait 09/24/2011  . Nocturia 09/24/2011  . Malignant neoplasm of breast (female), unspecified site 08/13/2011  . Hyposmolality and/or hyponatremia 08/13/2011  . Anemia, unspecified 08/13/2011  . Closed fracture of unspecified part of vertebral column without mention of spinal cord injury 08/13/2011  . Personal history of fall 08/13/2011  . Hypertension   . Memory deficit 03/02/2011     2012: HAs STMl, repeats herself, some difficulkty with sequence of events. Functional status, language skills are  intact.   Past Surgical History  Procedure Laterality Date  . Coronary angioplasty  03/23/94    NORMAL LEFT VENTRICULAR SYSTOLIC FUNCTION, EF 95%  . Av fistula repair    . Hip surgery Left 10/07/2010    Dr. Rodell Perna    . Coronary angioplasty with stent placement      RIGHT CORONARY ARTERY  . Mastectomy Left     left    Social History:   reports that she quit smoking about 33 years ago. She has never used smokeless tobacco. She reports that she does not drink  alcohol or use illicit drugs.  Family History  Problem Relation Age of Onset  . Heart disease Mother   . Heart attack Father   . Cancer Sister   . Cancer Brother     Medications: Patient's Medications  New Prescriptions   No medications on file  Previous Medications   ACETAMINOPHEN (TYLENOL) 325 MG TABLET    Take 650 mg by mouth. Take two three times daily; take 2 tablets as needed for pain   AMLODIPINE (NORVASC) 5 MG TABLET    Take one tablet daily for blood pressure    AMOXICILLIN (AMOXIL) 500 MG CAPSULE    Take 4 capsules one hour prior to dental appointment   ASPIRIN 81 MG TABLET    Take 81 mg by mouth daily.     CALCITONIN, SALMON, (MIACALCIN/FORTICAL) 200 UNIT/ACT NASAL SPRAY    Place 1 spray into the nose daily. Alternate nostrils   CLONIDINE (CATAPRES) 0.1 MG TABLET    Take 0.1 mg by mouth every 4 (four) hours as needed.     LEVOTHYROXINE (SYNTHROID, LEVOTHROID) 25 MCG TABLET    Brand only, take one tablet daily   LOSARTAN (COZAAR) 50 MG TABLET    Take 1 tablet (50 mg total) by mouth daily.   METOPROLOL (LOPRESSOR) 50 MG TABLET    Take 50 mg by mouth 2 (two) times daily.   MIRABEGRON ER (MYRBETRIQ) 50 MG TB24    Take 50 mg by mouth daily.   MIRTAZAPINE (REMERON) 7.5 MG TABLET    Take by mouth Daily.   MULTIPLE VITAMIN (MULTIVITAMIN) TABLET    Take 1 tablet by mouth daily.   MULTIPLE VITAMINS-MINERALS (PRESERVISION AREDS PO)    Take 1 capsule by mouth. Twice a day   NAPROXEN SODIUM (ALEVE) 220 MG TABLET    Take 1 tablet (220 mg total) by mouth daily.   NITROGLYCERIN (NITROSTAT) 0.4 MG SL TABLET    Place 0.4 mg under the tongue every 5 (five) minutes as needed.     POLYETHYL GLYCOL-PROPYL GLYCOL (SYSTANE OP)    Apply to eye. As directed   POLYETHYLENE GLYCOL 3350 (MIRALAX PO)    Take by mouth daily.     SENNA (SENOKOT) 8.6 MG TABLET    Take 2 tablets by mouth daily.   TRIAMCINOLONE CREAM (KENALOG) 0.1 %    Apply topically every morning.  Modified Medications   No medications on file  Discontinued Medications   No medications on file     Physical Exam: Filed Vitals:   09/13/14 1644  BP: 118/60  Pulse: 68  Temp: 98.3 F (36.8 C)  TempSrc: Oral  Weight: 103 lb (46.72 kg)    Physical Exam  Constitutional: She appears well-developed and well-nourished. No distress.  HENT:  Head: Normocephalic and atraumatic.  Eyes: Conjunctivae and EOM are normal. Pupils are equal, round, and reactive to light.  Neck: Normal range of motion. Neck supple.    Cardiovascular: Normal rate and regular rhythm.   Murmur heard. Pulmonary/Chest: Effort normal and breath sounds normal. No respiratory distress.  Abdominal: Soft. Bowel sounds are normal. She exhibits no distension. There is no tenderness.  Musculoskeletal: She exhibits no edema or tenderness.  Neurological: She is alert.  Memory loss  Skin: Skin is warm and dry. Ecchymosis (brusing noted to left forearm,chest, left lower leg) noted.  Psychiatric: She has a normal mood and affect. Cognition and memory are impaired.    Labs reviewed: Basic Metabolic Panel:  Recent Labs  11/30/13 05/23/14  NA  138 134*  K 4.8 4.5  BUN 20 21  CREATININE 1.0 1.0   Liver Function Tests:  Recent Labs  11/30/13 05/23/14  AST 17 16  ALT 8 10  ALKPHOS 68 60   No results for input(s): LIPASE, AMYLASE in the last 8760 hours. No results for input(s): AMMONIA in the last 8760 hours. CBC:  Recent Labs  05/23/14  WBC 4.8  HGB 9.9*  HCT 29*  PLT 209   TSH:  Recent Labs  11/30/13  TSH 3.93   A1C: No results found for: HGBA1C Lipid Panel: No results for input(s): CHOL, HDL, LDLCALC, TRIG, CHOLHDL, LDLDIRECT in the last 8760 hours.   Assessment/Plan  1. Dementia, without behavioral disturbance Staff reports worsening confusion with increase briusing Pt with baseline dementia and a poor historian, MMSE of 19/30 in April. Will get CBC, BMP UA C&S to see if reversible cause however most likely due to progression of disease Also SW has been in touch with son regarding care givers to stay with pt due to wandering. Would recommend this ASAP or move to memory care   2. Bruising tendency No falls reported however pt with poor memory, increased assistance needed.  Bruising noted to arm, leg and chest Pt with heart history, CAD however will dc ASA at this time due to briusing and most likely frequent falls  Will follow up CBC and BMP

## 2014-10-31 ENCOUNTER — Non-Acute Institutional Stay: Payer: Medicare Other | Admitting: Internal Medicine

## 2014-10-31 ENCOUNTER — Encounter: Payer: Self-pay | Admitting: Internal Medicine

## 2014-10-31 VITALS — BP 146/68 | HR 76 | Wt 102.0 lb

## 2014-10-31 DIAGNOSIS — F0390 Unspecified dementia without behavioral disturbance: Secondary | ICD-10-CM

## 2014-10-31 DIAGNOSIS — D6489 Other specified anemias: Secondary | ICD-10-CM

## 2014-10-31 DIAGNOSIS — I25119 Atherosclerotic heart disease of native coronary artery with unspecified angina pectoris: Secondary | ICD-10-CM

## 2014-10-31 DIAGNOSIS — I119 Hypertensive heart disease without heart failure: Secondary | ICD-10-CM

## 2014-10-31 DIAGNOSIS — E039 Hypothyroidism, unspecified: Secondary | ICD-10-CM

## 2014-10-31 DIAGNOSIS — N3281 Overactive bladder: Secondary | ICD-10-CM

## 2014-10-31 NOTE — Progress Notes (Signed)
Patient ID: Kendra Tapia, female   DOB: 10/08/1912, 81 y.o.   MRN: [id number]   Location:  [facility]  Code Status: DNR  Allergies  Allergen Reactions  . Lipitor [Atorvastatin Calcium]     MUSCLE ACHES  . Micardis [Telmisartan]   . Other     Adhesive tape,chocolate,citrus,berriespaper tape    Chief Complaint  Patient presents with  . Medical Management of Chronic Issues    6 month office visit for blood pressure, thyroid, anemia, memory    HPI: Patient is a 81 y.o. female assisted living resident seen in the office today for med mgt of chronic diseases (6 mo f/u).    Head hurts her at times.  No congestion.  Has not bothered her long.  Doesn't feel much like getting up in the mornings.  Whole head hurts.  Bothers her when she wakes up in the morning.  Takes tylenol for the headaches.  It is helpful.  Has difficulty finding words.    Thinks she's pretty healthy for 81 years old.    No falls recently.  Has fallen several times, but it was a few months ago.    Sleeps well at night as a rule.    Mood is alright.  Denies sadness or depression.  Bladder is doing terrible.  Has to get up a lot at night and go to the bathroom.  Is on myrbetriq--? Helping. Is making it to the bathroom most of the time.    Vision is doing well.  Sees eye doctor.  Was reading a magazine in the exam room.  Hurts beneath her right breast once in a while.  Not real bad but not good either.  Doesn't last long.  Feels like an ache.    Review of Systems:  Review of Systems  Constitutional: Positive for malaise/fatigue. Negative for fever.  HENT: Negative for congestion.   Eyes: Negative for blurred vision.  Respiratory: Negative for cough and shortness of breath.   Cardiovascular: Positive for chest pain.       Right sided  Beneath breast  Gastrointestinal: Negative for abdominal pain, constipation, blood in stool and melena.  Genitourinary: Positive for urgency and frequency. Negative  for dysuria and hematuria.  Musculoskeletal: Negative for falls.  Neurological: Positive for headaches.  Psychiatric/Behavioral: Positive for memory loss. Negative for depression. The patient does not have insomnia.      Past Medical History  Diagnosis Date  . IHD (ischemic heart disease)   . Nausea   . Coronary artery disease   . Hypothyroidism   . Hyperlipidemia   . Dizziness   . Exudative senile macular degeneration of retina 07/28/2012  . Tear film insufficiency, unspecified 07/28/2012  . Dermatophytosis of foot 05/10/2012  . Unspecified constipation 05/10/2012  . Unspecified urinary incontinence 05/10/2012  . Urethrocele(618.03) 02/11/2012  . Female stress incontinence 02/11/2012  . Depressive disorder, not elsewhere classified 11/10/2011  . Other malaise and fatigue 11/10/2011  . Loss of weight 11/10/2011  . Macular degeneration (senile) of retina, unspecified 09/24/2011  . Unspecified disorder of kidney and ureter 09/24/2011  . Pathologic fracture of vertebrae 09/24/2011  . Memory loss 09/24/2011  . Abnormality of gait 09/24/2011  . Nocturia 09/24/2011  . Malignant neoplasm of breast (female), unspecified site 08/13/2011  . Hyposmolality and/or hyponatremia 08/13/2011  . Anemia, unspecified 08/13/2011  . Closed fracture of unspecified part of vertebral column without mention of spinal cord injury 08/13/2011  . Personal history of fall 08/13/2011  . Hypertension   .  Memory deficit 03/02/2011     2012: HAs STMl, repeats herself, some difficulkty with sequence of events. Functional status, language skills are  intact.    Past Surgical History  Procedure Laterality Date  . Coronary angioplasty  03/23/94    NORMAL LEFT VENTRICULAR SYSTOLIC FUNCTION, EF 69%  . Av fistula repair    . Hip surgery Left 10/07/2010    Dr. Rodell Perna    . Coronary angioplasty with stent placement      RIGHT CORONARY ARTERY  . Mastectomy Left     left     Social History:   reports that she quit  smoking about 33 years ago. She has never used smokeless tobacco. She reports that she does not drink alcohol or use illicit drugs.  Family History  Problem Relation Age of Onset  . Heart disease Mother   . Heart attack Father   . Cancer Sister   . Cancer Brother     Medications: Patient's Medications  New Prescriptions   No medications on file  Previous Medications   ACETAMINOPHEN (TYLENOL) 325 MG TABLET    Take 650 mg by mouth. Take two three times daily; take 2 tablets as needed for pain   AMLODIPINE (NORVASC) 5 MG TABLET    Take one tablet daily for blood pressure   AMOXICILLIN (AMOXIL) 500 MG CAPSULE    Take 4 capsules one hour prior to dental appointment   CALCITONIN, SALMON, (MIACALCIN/FORTICAL) 200 UNIT/ACT NASAL SPRAY    Place 1 spray into the nose daily. Alternate nostrils   CLONIDINE (CATAPRES) 0.1 MG TABLET    Take 0.1 mg by mouth every 4 (four) hours as needed.     LEVOTHYROXINE (SYNTHROID, LEVOTHROID) 25 MCG TABLET    Brand only, take one tablet daily   LOSARTAN (COZAAR) 50 MG TABLET    Take 1 tablet (50 mg total) by mouth daily.   METOPROLOL (LOPRESSOR) 50 MG TABLET    Take 50 mg by mouth 2 (two) times daily.   MIRABEGRON ER (MYRBETRIQ) 50 MG TB24    Take 50 mg by mouth daily.   MIRTAZAPINE (REMERON) 7.5 MG TABLET    Take by mouth Daily.   MULTIPLE VITAMIN (MULTIVITAMIN) TABLET    Take 1 tablet by mouth daily.   MULTIPLE VITAMINS-MINERALS (PRESERVISION AREDS PO)    Take 1 capsule by mouth. Twice a day   NAPROXEN SODIUM (ALEVE) 220 MG TABLET    Take 1 tablet (220 mg total) by mouth daily.   NITROGLYCERIN (NITROSTAT) 0.4 MG SL TABLET    Place 0.4 mg under the tongue every 5 (five) minutes as needed.     POLYETHYL GLYCOL-PROPYL GLYCOL (SYSTANE OP)    Apply to eye. As directed   POLYETHYLENE GLYCOL 3350 (MIRALAX PO)    Take by mouth daily.     SENNA (SENOKOT) 8.6 MG TABLET    Take 2 tablets by mouth daily.   TRIAMCINOLONE CREAM (KENALOG) 0.1 %    Apply topically every  morning.  Modified Medications   No medications on file  Discontinued Medications   ASPIRIN 81 MG TABLET    Take 81 mg by mouth daily.       Physical Exam: Filed Vitals:   10/31/14 1522  BP: 146/68  Pulse: 76  Weight: 102 lb (46.267 kg)  SpO2: 99%  Physical Exam  Constitutional: She appears well-nourished. No distress.  Cardiovascular: Normal rate, regular rhythm and intact distal pulses.   Murmur heard. Pulmonary/Chest: Effort normal and breath sounds  normal.  Abdominal: Soft. Bowel sounds are normal. She exhibits no distension and no mass. There is no tenderness.  Musculoskeletal: Normal range of motion.  Neurological: She is alert.  Pleasantly confused  Skin: Skin is warm and dry.  Psychiatric: She has a normal mood and affect.    Labs reviewed: Basic Metabolic Panel:  Recent Labs  11/30/13 05/23/14 09/13/14  NA 138 134* 134*  K 4.8 4.5 4.6  BUN 20 21 24*  CREATININE 1.0 1.0 1.1  TSH 3.93  --   --    Liver Function Tests:  Recent Labs  11/30/13 05/23/14  AST 17 16  ALT 8 10  ALKPHOS 68 60   No results for input(s): LIPASE, AMYLASE in the last 8760 hours. No results for input(s): AMMONIA in the last 8760 hours. CBC:  Recent Labs  05/23/14 09/13/14  WBC 4.8 4.8  HGB 9.9* 8.8*  HCT 29* 27*  PLT 209 229   Assessment/Plan 1. Dementia, without behavioral disturbance -seems to be progressing significantly -lives in [place] -not on medications for this -monitor safety  2. Coronary artery disease involving native coronary artery of native heart with angina pectoris -cont secondary prevention with bp, lipid control, encouraged some walking for exercise  3. Hypothyroidism, unspecified hypothyroidism type -cont current synthroid and f/u tsh with next labs  4. Benign hypertensive heart disease without heart failure -bp at goal for her at 81 years old with catapres, lopressor and amlodipine  5. Overactive bladder Pt was unclear if her medication was helping  this problem but upon speaking with her nurse the myrbetriq has helped her frequency and incontinence  6. Anemia due to other cause -likely just age-related at 81 yo, but will do anemia workup with next labs b/c significant anemia and b12 deficiency can affect memory  Labs/tests ordered: cbc, bmp, iron panel with ferritin, b12/folate, tsh before   Next appt:  3 mos with me or Jessica  Kelechi Astarita L. Harrison Paulson, D.O. [facility] [address],  [address] Cell Phone (Mon-Fri 8am-5pm):  [phone] On Call:  [phone] & follow prompts after 5pm & weekends Office Phone:  [phone] Office Fax:  [phone]

## 2014-11-20 ENCOUNTER — Encounter: Payer: Self-pay | Admitting: Internal Medicine

## 2014-11-26 ENCOUNTER — Observation Stay (HOSPITAL_COMMUNITY)
Admission: EM | Admit: 2014-11-26 | Discharge: 2014-11-28 | Disposition: A | Discharge status: Skilled Nursing Facility | Payer: Medicare Other | Attending: Internal Medicine | Admitting: Internal Medicine

## 2014-11-26 ENCOUNTER — Encounter (HOSPITAL_COMMUNITY): Payer: Self-pay | Admitting: Emergency Medicine

## 2014-11-26 ENCOUNTER — Emergency Department (HOSPITAL_COMMUNITY): Payer: Medicare Other

## 2014-11-26 DIAGNOSIS — R32 Unspecified urinary incontinence: Secondary | ICD-10-CM | POA: Diagnosis present

## 2014-11-26 DIAGNOSIS — I2510 Atherosclerotic heart disease of native coronary artery without angina pectoris: Secondary | ICD-10-CM | POA: Diagnosis present

## 2014-11-26 DIAGNOSIS — W06XXXA Fall from bed, initial encounter: Secondary | ICD-10-CM | POA: Diagnosis not present

## 2014-11-26 DIAGNOSIS — W19XXXA Unspecified fall, initial encounter: Secondary | ICD-10-CM | POA: Diagnosis present

## 2014-11-26 DIAGNOSIS — E039 Hypothyroidism, unspecified: Secondary | ICD-10-CM | POA: Diagnosis present

## 2014-11-26 DIAGNOSIS — T148XXA Other injury of unspecified body region, initial encounter: Secondary | ICD-10-CM

## 2014-11-26 DIAGNOSIS — D649 Anemia, unspecified: Secondary | ICD-10-CM | POA: Diagnosis present

## 2014-11-26 DIAGNOSIS — R0902 Hypoxemia: Secondary | ICD-10-CM | POA: Diagnosis present

## 2014-11-26 DIAGNOSIS — M160 Bilateral primary osteoarthritis of hip: Secondary | ICD-10-CM | POA: Diagnosis present

## 2014-11-26 DIAGNOSIS — S40012A Contusion of left shoulder, initial encounter: Secondary | ICD-10-CM

## 2014-11-26 DIAGNOSIS — E785 Hyperlipidemia, unspecified: Secondary | ICD-10-CM | POA: Diagnosis not present

## 2014-11-26 DIAGNOSIS — Z87891 Personal history of nicotine dependence: Secondary | ICD-10-CM | POA: Diagnosis not present

## 2014-11-26 DIAGNOSIS — D72829 Elevated white blood cell count, unspecified: Secondary | ICD-10-CM

## 2014-11-26 DIAGNOSIS — R269 Unspecified abnormalities of gait and mobility: Secondary | ICD-10-CM

## 2014-11-26 DIAGNOSIS — I509 Heart failure, unspecified: Secondary | ICD-10-CM | POA: Diagnosis not present

## 2014-11-26 DIAGNOSIS — Z79899 Other long term (current) drug therapy: Secondary | ICD-10-CM | POA: Diagnosis not present

## 2014-11-26 DIAGNOSIS — S32592A Other specified fracture of left pubis, initial encounter for closed fracture: Secondary | ICD-10-CM

## 2014-11-26 DIAGNOSIS — I10 Essential (primary) hypertension: Secondary | ICD-10-CM | POA: Diagnosis present

## 2014-11-26 DIAGNOSIS — I350 Nonrheumatic aortic (valve) stenosis: Secondary | ICD-10-CM

## 2014-11-26 DIAGNOSIS — T1490XA Injury, unspecified, initial encounter: Secondary | ICD-10-CM

## 2014-11-26 DIAGNOSIS — Y92122 Bedroom in nursing home as the place of occurrence of the external cause: Secondary | ICD-10-CM | POA: Diagnosis not present

## 2014-11-26 DIAGNOSIS — Z888 Allergy status to other drugs, medicaments and biological substances status: Secondary | ICD-10-CM | POA: Diagnosis not present

## 2014-11-26 DIAGNOSIS — M546 Pain in thoracic spine: Secondary | ICD-10-CM | POA: Diagnosis present

## 2014-11-26 DIAGNOSIS — H3530 Unspecified macular degeneration: Secondary | ICD-10-CM | POA: Diagnosis present

## 2014-11-26 NOTE — ED Notes (Signed)
Pt fell attempting to get into bed due to loss of balance. She states that she hit her head. C/o L upper thigh pain. Denies LOC, denies SOB but sats upon EMS arrival were 83%RA, 4L improved sats to 90%.

## 2014-11-26 NOTE — Discharge Instructions (Signed)
Contusion °A contusion is a deep bruise. Contusions are the result of an injury that caused bleeding under the skin. The contusion may turn blue, purple, or yellow. Minor injuries will give you a painless contusion, but more severe contusions may stay painful and swollen for a few weeks.  °CAUSES  °A contusion is usually caused by a blow, trauma, or direct force to an area of the body. °SYMPTOMS  °· Swelling and redness of the injured area. °· Bruising of the injured area. °· Tenderness and soreness of the injured area. °· Pain. °DIAGNOSIS  °The diagnosis can be made by taking a history and physical exam. An X-ray, CT scan, or MRI may be needed to determine if there were any associated injuries, such as fractures. °TREATMENT  °Specific treatment will depend on what area of the body was injured. In general, the best treatment for a contusion is resting, icing, elevating, and applying cold compresses to the injured area. Over-the-counter medicines may also be recommended for pain control. Ask your caregiver what the best treatment is for your contusion. °HOME CARE INSTRUCTIONS  °· Put ice on the injured area. °¨ Put ice in a plastic bag. °¨ Place a towel between your skin and the bag. °¨ Leave the ice on for 15-20 minutes, 3-4 times a day, or as directed by your health care provider. °· Only take over-the-counter or prescription medicines for pain, discomfort, or fever as directed by your caregiver. Your caregiver may recommend avoiding anti-inflammatory medicines (aspirin, ibuprofen, and naproxen) for 48 hours because these medicines may increase bruising. °· Rest the injured area. °· If possible, elevate the injured area to reduce swelling. °SEEK IMMEDIATE MEDICAL CARE IF:  °· You have increased bruising or swelling. °· You have pain that is getting worse. °· Your swelling or pain is not relieved with medicines. °MAKE SURE YOU:  °· Understand these instructions. °· Will watch your condition. °· Will get help right  away if you are not doing well or get worse. °Document Released: 07/09/2005 Document Revised: 10/04/2013 Document Reviewed: 08/04/2011 °[employer]® Patient Information ©2015 [employer]. This information is not intended to replace advice given to you by your health care provider. Make sure you discuss any questions you have with your health care provider. ° °

## 2014-11-26 NOTE — ED Notes (Signed)
Pt sts that she was going to bed this evening and fell hitting her L clavicle on floor. Pt sts that she lost her balance. Pt is A&O and in NAD

## 2014-11-26 NOTE — ED Notes (Signed)
Joneen Roach (pt's nephew): [phone]

## 2014-11-26 NOTE — ED Provider Notes (Signed)
CSN: [id number]     Arrival date & time 11/26/14  1855 History   First MD Initiated Contact with Patient 11/26/14 1902     Chief Complaint  Patient presents with  . Clavicle Injury     (Consider location/radiation/quality/duration/timing/severity/associated sxs/prior Treatment) HPI Comments: Patient here after having mechanical fall as she was trying to go to bed. [address] and struck her left clavicle and has deformity there. Denies any recent illnesses. States that she lost her balance. Denies any head injury or loss of consciousness. Complains of sharp pain worse with movement at her midthoracic spine. Denies any weakness in arms or legs. Does note bilateral hip pain without shortening or rotation. Denies any saddle anesthesias. Symptoms worse with movement. EMS was called patient mobilized and transported here  The history is provided by the patient.    Past Medical History  Diagnosis Date  . IHD (ischemic heart disease)   . Nausea   . Coronary artery disease   . Hypothyroidism   . Hyperlipidemia   . Dizziness   . Exudative senile macular degeneration of retina 07/28/2012  . Tear film insufficiency, unspecified 07/28/2012  . Dermatophytosis of foot 05/10/2012  . Unspecified constipation 05/10/2012  . Unspecified urinary incontinence 05/10/2012  . Urethrocele(618.03) 02/11/2012  . Female stress incontinence 02/11/2012  . Depressive disorder, not elsewhere classified 11/10/2011  . Other malaise and fatigue 11/10/2011  . Loss of weight 11/10/2011  . Macular degeneration (senile) of retina, unspecified 09/24/2011  . Unspecified disorder of kidney and ureter 09/24/2011  . Pathologic fracture of vertebrae 09/24/2011  . Memory loss 09/24/2011  . Abnormality of gait 09/24/2011  . Nocturia 09/24/2011  . Malignant neoplasm of breast (female), unspecified site 08/13/2011  . Hyposmolality and/or hyponatremia 08/13/2011  . Anemia, unspecified 08/13/2011  . Closed fracture of unspecified part of  vertebral column without mention of spinal cord injury 08/13/2011  . Personal history of fall 08/13/2011  . Hypertension   . Memory deficit 03/02/2011     2012: HAs STMl, repeats herself, some difficulkty with sequence of events. Functional status, language skills are  intact.   Past Surgical History  Procedure Laterality Date  . Coronary angioplasty  03/23/94    NORMAL LEFT VENTRICULAR SYSTOLIC FUNCTION, EF 79%  . Av fistula repair    . Hip surgery Left 10/07/2010    Dr. Rodell Perna    . Coronary angioplasty with stent placement      RIGHT CORONARY ARTERY  . Mastectomy Left     left    Family History  Problem Relation Age of Onset  . Heart disease Mother   . Heart attack Father   . Cancer Sister   . Cancer Brother    History  Substance Use Topics  . Smoking status: Former Smoker    Quit date: 03/11/1981  . Smokeless tobacco: Never Used  . Alcohol Use: No   OB History    No data available     Review of Systems  All other systems reviewed and are negative.     Allergies  Lipitor; Micardis; and Other  Home Medications   Prior to Admission medications   Medication Sig Start Date End Date Taking? Authorizing Provider  acetaminophen (TYLENOL) 325 MG tablet Take 650 mg by mouth. Take two three times daily; take 2 tablets as needed for pain    Historical Provider, MD  amLODipine (NORVASC) 5 MG tablet Take one tablet daily for blood pressure 08/02/14   Historical Provider, MD  amoxicillin (AMOXIL) 500  MG capsule Take 4 capsules one hour prior to dental appointment 03/09/13   Historical Provider, MD  calcitonin, salmon, (MIACALCIN/FORTICAL) 200 UNIT/ACT nasal spray Place 1 spray into the nose daily. Alternate nostrils    Historical Provider, MD  cloNIDine (CATAPRES) 0.1 MG tablet Take 0.1 mg by mouth every 4 (four) hours as needed.      Historical Provider, MD  levothyroxine (SYNTHROID, LEVOTHROID) 25 MCG tablet Brand only, take one tablet daily 02/20/11   Darlin Coco, MD   losartan (COZAAR) 50 MG tablet Take 1 tablet (50 mg total) by mouth daily. 09/26/13   Darlin Coco, MD  metoprolol (LOPRESSOR) 50 MG tablet Take 50 mg by mouth 2 (two) times daily.    Historical Provider, MD  mirabegron ER (MYRBETRIQ) 50 MG TB24 Take 50 mg by mouth daily.    Historical Provider, MD  mirtazapine (REMERON) 7.5 MG tablet Take by mouth Daily. 02/02/12   Historical Provider, MD  Multiple Vitamin (MULTIVITAMIN) tablet Take 1 tablet by mouth daily.    Historical Provider, MD  Multiple Vitamins-Minerals (PRESERVISION AREDS PO) Take 1 capsule by mouth. Twice a day    Historical Provider, MD  naproxen sodium (ALEVE) 220 MG tablet Take 1 tablet (220 mg total) by mouth daily. 03/01/14   Claudette Jeri Cos, NP  nitroGLYCERIN (NITROSTAT) 0.4 MG SL tablet Place 0.4 mg under the tongue every 5 (five) minutes as needed.      Historical Provider, MD  Polyethyl Glycol-Propyl Glycol (SYSTANE OP) Apply to eye. As directed    Historical Provider, MD  Polyethylene Glycol 3350 (MIRALAX PO) Take by mouth daily.      Historical Provider, MD  senna (SENOKOT) 8.6 MG tablet Take 2 tablets by mouth daily.    Historical Provider, MD  triamcinolone cream (KENALOG) 0.1 % Apply topically every morning.    Historical Provider, MD   BP 139/60 mmHg  Pulse 74  Temp(Src) 97.6 F (36.4 C) (Oral)  Resp 18  SpO2 97% Physical Exam  Constitutional: She is oriented to person, place, and time. She appears well-developed and well-nourished.  Non-toxic appearance. No distress.  HENT:  Head: Normocephalic and atraumatic.  Eyes: Conjunctivae, EOM and lids are normal. Pupils are equal, round, and reactive to light.  Neck: Normal range of motion. Neck supple. No tracheal deviation present. No thyroid mass present.  Cardiovascular: Normal rate, regular rhythm and normal heart sounds.  Exam reveals no gallop.   No murmur heard. Pulmonary/Chest: Effort normal and breath sounds normal. No accessory muscle usage or stridor.  No respiratory distress. She has no decreased breath sounds. She has no wheezes. She has no rhonchi. She has no rales.  Abdominal: Soft. Normal appearance and bowel sounds are normal. She exhibits no distension. There is no tenderness. There is no rebound and no CVA tenderness.  Musculoskeletal: Normal range of motion. She exhibits no edema or tenderness.       Back:       Arms: No shortening or rotation noted at bilateral hips. Full range of motion. Neurovascular intact distally of both legs.  Neurological: She is alert and oriented to person, place, and time. She has normal strength. No cranial nerve deficit or sensory deficit. GCS eye subscore is 4. GCS verbal subscore is 5. GCS motor subscore is 6.  Skin: Skin is warm and dry. No abrasion and no rash noted.  Psychiatric: She has a normal mood and affect. Her speech is normal and behavior is normal.  Nursing note and vitals reviewed.  ED Course  Procedures (including critical care time) Labs Review Labs Reviewed - No data to display  Imaging Review No results found.   EKG Interpretation None      MDM   Final diagnoses:  Trauma    Patient's x-rays are without acute findings. Pulse oximetry room air is 94% per my interpretation at the bedside. She is stable for discharge    Leota Jacobsen, MD 11/26/14 2125

## 2014-11-26 NOTE — ED Notes (Signed)
[facility] called for transportation to [facility]

## 2014-11-27 DIAGNOSIS — E780 Pure hypercholesterolemia: Secondary | ICD-10-CM

## 2014-11-27 DIAGNOSIS — E039 Hypothyroidism, unspecified: Secondary | ICD-10-CM | POA: Diagnosis present

## 2014-11-27 DIAGNOSIS — R269 Unspecified abnormalities of gait and mobility: Secondary | ICD-10-CM

## 2014-11-27 DIAGNOSIS — S40019A Contusion of unspecified shoulder, initial encounter: Secondary | ICD-10-CM

## 2014-11-27 DIAGNOSIS — R32 Unspecified urinary incontinence: Secondary | ICD-10-CM

## 2014-11-27 DIAGNOSIS — I25119 Atherosclerotic heart disease of native coronary artery with unspecified angina pectoris: Secondary | ICD-10-CM

## 2014-11-27 DIAGNOSIS — T148XXA Other injury of unspecified body region, initial encounter: Secondary | ICD-10-CM | POA: Insufficient documentation

## 2014-11-27 DIAGNOSIS — W19XXXA Unspecified fall, initial encounter: Secondary | ICD-10-CM

## 2014-11-27 DIAGNOSIS — I10 Essential (primary) hypertension: Secondary | ICD-10-CM

## 2014-11-27 DIAGNOSIS — S4980XA Other specified injuries of shoulder and upper arm, unspecified arm, initial encounter: Secondary | ICD-10-CM | POA: Insufficient documentation

## 2014-11-27 DIAGNOSIS — R0902 Hypoxemia: Secondary | ICD-10-CM

## 2014-11-27 DIAGNOSIS — D72829 Elevated white blood cell count, unspecified: Secondary | ICD-10-CM

## 2014-11-27 DIAGNOSIS — S40012A Contusion of left shoulder, initial encounter: Principal | ICD-10-CM

## 2014-11-27 DIAGNOSIS — I350 Nonrheumatic aortic (valve) stenosis: Secondary | ICD-10-CM

## 2014-11-27 DIAGNOSIS — I509 Heart failure, unspecified: Secondary | ICD-10-CM

## 2014-11-27 DIAGNOSIS — W19XXXD Unspecified fall, subsequent encounter: Secondary | ICD-10-CM

## 2014-11-27 LAB — URINALYSIS, ROUTINE W REFLEX MICROSCOPIC
Bilirubin Urine: NEGATIVE
GLUCOSE, UA: NEGATIVE mg/dL
Hgb urine dipstick: NEGATIVE
Ketones, ur: NEGATIVE mg/dL
Leukocytes, UA: NEGATIVE
NITRITE: NEGATIVE
Protein, ur: NEGATIVE mg/dL
Specific Gravity, Urine: 1.021 (ref 1.005–1.030)
UROBILINOGEN UA: 0.2 mg/dL (ref 0.0–1.0)
pH: 5 (ref 5.0–8.0)

## 2014-11-27 LAB — PROTIME-INR
INR: 1.13 (ref 0.00–1.49)
PROTHROMBIN TIME: 14.7 s (ref 11.6–15.2)

## 2014-11-27 LAB — CBC WITH DIFFERENTIAL/PLATELET
Basophils Absolute: 0 10*3/uL (ref 0.0–0.1)
Basophils Relative: 0 % (ref 0–1)
EOS ABS: 0 10*3/uL (ref 0.0–0.7)
Eosinophils Relative: 0 % (ref 0–5)
HCT: 27.5 % — ABNORMAL LOW (ref 36.0–46.0)
HEMOGLOBIN: 9.1 g/dL — AB (ref 12.0–15.0)
Lymphocytes Relative: 3 % — ABNORMAL LOW (ref 12–46)
Lymphs Abs: 0.5 10*3/uL — ABNORMAL LOW (ref 0.7–4.0)
MCH: 31.1 pg (ref 26.0–34.0)
MCHC: 33.1 g/dL (ref 30.0–36.0)
MCV: 93.9 fL (ref 78.0–100.0)
Monocytes Absolute: 0.6 10*3/uL (ref 0.1–1.0)
Monocytes Relative: 4 % (ref 3–12)
NEUTROS PCT: 93 % — AB (ref 43–77)
Neutro Abs: 14.5 10*3/uL — ABNORMAL HIGH (ref 1.7–7.7)
Platelets: 184 10*3/uL (ref 150–400)
RBC: 2.93 MIL/uL — AB (ref 3.87–5.11)
RDW: 15.5 % (ref 11.5–15.5)
WBC: 15.6 10*3/uL — ABNORMAL HIGH (ref 4.0–10.5)

## 2014-11-27 LAB — COMPREHENSIVE METABOLIC PANEL
ALBUMIN: 3.8 g/dL (ref 3.5–5.2)
ALK PHOS: 76 U/L (ref 39–117)
ALT: 15 U/L (ref 0–35)
AST: 29 U/L (ref 0–37)
Anion gap: 7 (ref 5–15)
BUN: 26 mg/dL — ABNORMAL HIGH (ref 6–23)
CO2: 20 mmol/L (ref 19–32)
CREATININE: 1.07 mg/dL (ref 0.50–1.10)
Calcium: 8.5 mg/dL (ref 8.4–10.5)
Chloride: 100 mmol/L (ref 96–112)
GFR, EST AFRICAN AMERICAN: 48 mL/min — AB (ref >90)
GFR, EST NON AFRICAN AMERICAN: 41 mL/min — AB (ref >90)
Glucose, Bld: 140 mg/dL — ABNORMAL HIGH (ref 70–99)
POTASSIUM: 4.8 mmol/L (ref 3.5–5.1)
Sodium: 127 mmol/L — ABNORMAL LOW (ref 135–145)
TOTAL PROTEIN: 6.3 g/dL (ref 6.0–8.3)
Total Bilirubin: 0.7 mg/dL (ref 0.3–1.2)

## 2014-11-27 LAB — GLUCOSE, CAPILLARY: Glucose-Capillary: 125 mg/dL — ABNORMAL HIGH (ref 70–99)

## 2014-11-27 LAB — TROPONIN I: Troponin I: 0.03 ng/mL (ref <0.031)

## 2014-11-27 LAB — BRAIN NATRIURETIC PEPTIDE: B Natriuretic Peptide: 271.5 pg/mL — ABNORMAL HIGH (ref 0.0–100.0)

## 2014-11-27 MED ORDER — AMLODIPINE BESYLATE 5 MG PO TABS
5.0000 mg | ORAL_TABLET | Freq: Every day | ORAL | Status: DC
  Administered 2014-11-27 – 2014-11-28 (×2): 5 mg via ORAL
  Filled 2014-11-27 (×2): qty 1

## 2014-11-27 MED ORDER — POLYETHYLENE GLYCOL 3350 17 G PO PACK
17.0000 g | PACK | Freq: Every day | ORAL | Status: DC
  Administered 2014-11-27 – 2014-11-28 (×2): 17 g via ORAL
  Filled 2014-11-27 (×2): qty 1

## 2014-11-27 MED ORDER — METOPROLOL TARTRATE 50 MG PO TABS
50.0000 mg | ORAL_TABLET | Freq: Two times a day (BID) | ORAL | Status: DC
  Administered 2014-11-27 – 2014-11-28 (×3): 50 mg via ORAL
  Filled 2014-11-27 (×4): qty 1

## 2014-11-27 MED ORDER — HEPARIN SODIUM (PORCINE) 5000 UNIT/ML IJ SOLN
5000.0000 [IU] | Freq: Three times a day (TID) | INTRAMUSCULAR | Status: DC
  Administered 2014-11-27 – 2014-11-28 (×5): 5000 [IU] via SUBCUTANEOUS
  Filled 2014-11-27 (×7): qty 1

## 2014-11-27 MED ORDER — MIRTAZAPINE 7.5 MG PO TABS
7.5000 mg | ORAL_TABLET | Freq: Every day | ORAL | Status: DC
  Administered 2014-11-27: 7.5 mg via ORAL
  Filled 2014-11-27 (×2): qty 1

## 2014-11-27 MED ORDER — PRESERVISION AREDS PO CAPS: 1.0000 | ORAL_CAPSULE | Freq: Every day | ORAL | Status: DC

## 2014-11-27 MED ORDER — LOSARTAN POTASSIUM 50 MG PO TABS
50.0000 mg | ORAL_TABLET | Freq: Every day | ORAL | Status: DC
  Administered 2014-11-27 – 2014-11-28 (×2): 50 mg via ORAL
  Filled 2014-11-27 (×2): qty 1

## 2014-11-27 MED ORDER — CETYLPYRIDINIUM CHLORIDE 0.05 % MT LIQD
7.0000 mL | Freq: Two times a day (BID) | OROMUCOSAL | Status: DC
  Administered 2014-11-27 – 2014-11-28 (×2): 7 mL via OROMUCOSAL

## 2014-11-27 MED ORDER — NAPROXEN SODIUM 220 MG PO TABS: 220.0000 mg | ORAL_TABLET | Freq: Every day | ORAL | Status: DC

## 2014-11-27 MED ORDER — POLYVINYL ALCOHOL 1.4 % OP SOLN
1.0000 [drp] | Freq: Two times a day (BID) | OPHTHALMIC | Status: DC
  Administered 2014-11-27 – 2014-11-28 (×3): 1 [drp] via OPHTHALMIC
  Filled 2014-11-27: qty 15

## 2014-11-27 MED ORDER — MIRABEGRON ER 50 MG PO TB24
50.0000 mg | ORAL_TABLET | Freq: Every day | ORAL | Status: DC
  Administered 2014-11-27 – 2014-11-28 (×2): 50 mg via ORAL
  Filled 2014-11-27 (×2): qty 1

## 2014-11-27 MED ORDER — ACETAMINOPHEN 325 MG PO TABS: 650.0000 mg | ORAL_TABLET | Freq: Four times a day (QID) | ORAL | Status: DC | PRN

## 2014-11-27 MED ORDER — CLONIDINE HCL 0.1 MG PO TABS
0.1000 mg | ORAL_TABLET | ORAL | Status: DC | PRN
  Filled 2014-11-27: qty 1

## 2014-11-27 MED ORDER — OXYCODONE-ACETAMINOPHEN 5-325 MG PO TABS
1.0000 | ORAL_TABLET | Freq: Three times a day (TID) | ORAL | Status: DC | PRN
  Administered 2014-11-27 – 2014-11-28 (×2): 1 via ORAL
  Filled 2014-11-27 (×2): qty 1

## 2014-11-27 MED ORDER — ADULT MULTIVITAMIN W/MINERALS CH
1.0000 | ORAL_TABLET | Freq: Every day | ORAL | Status: DC
  Administered 2014-11-27 – 2014-11-28 (×2): 1 via ORAL
  Filled 2014-11-27 (×2): qty 1

## 2014-11-27 MED ORDER — POLYETHYLENE GLYCOL 3350 17 GM/SCOOP PO POWD
0.5000 | Freq: Every day | ORAL | Status: DC | PRN
  Filled 2014-11-27: qty 255

## 2014-11-27 MED ORDER — LEVOTHYROXINE SODIUM 25 MCG PO TABS
25.0000 ug | ORAL_TABLET | Freq: Every day | ORAL | Status: DC
  Administered 2014-11-27 – 2014-11-28 (×2): 25 ug via ORAL
  Filled 2014-11-27 (×3): qty 1

## 2014-11-27 MED ORDER — POLYETHYL GLYCOL-PROPYL GLYCOL 0.4-0.3 % OP SOLN: 1.0000 [drp] | Freq: Two times a day (BID) | OPHTHALMIC | Status: DC

## 2014-11-27 MED ORDER — NITROGLYCERIN 0.4 MG SL SUBL: 0.4000 mg | SUBLINGUAL_TABLET | SUBLINGUAL | Status: DC | PRN

## 2014-11-27 MED ORDER — NAPROXEN 250 MG PO TABS
250.0000 mg | ORAL_TABLET | Freq: Every day | ORAL | Status: DC
  Administered 2014-11-27 – 2014-11-28 (×2): 250 mg via ORAL
  Filled 2014-11-27 (×3): qty 1

## 2014-11-27 MED ORDER — CALCITONIN (SALMON) 200 UNIT/ACT NA SOLN
1.0000 | Freq: Every day | NASAL | Status: DC
  Administered 2014-11-27 – 2014-11-28 (×2): 1 via NASAL
  Filled 2014-11-27: qty 3.7

## 2014-11-27 MED ORDER — TRIAMCINOLONE ACETONIDE 0.1 % EX CREA
TOPICAL_CREAM | Freq: Every morning | CUTANEOUS | Status: DC
  Administered 2014-11-27 – 2014-11-28 (×2): via TOPICAL
  Filled 2014-11-27: qty 15

## 2014-11-27 MED ORDER — SENNA 8.6 MG PO TABS
2.0000 | ORAL_TABLET | Freq: Every day | ORAL | Status: DC
  Administered 2014-11-27 – 2014-11-28 (×2): 17.2 mg via ORAL
  Filled 2014-11-27 (×2): qty 2

## 2014-11-27 MED ORDER — PROSIGHT PO TABS
1.0000 | ORAL_TABLET | Freq: Every day | ORAL | Status: DC
  Administered 2014-11-27 – 2014-11-28 (×2): 1 via ORAL
  Filled 2014-11-27 (×2): qty 1

## 2014-11-27 NOTE — Progress Notes (Signed)
Progress Note   [name] DPO:[id number] DOB: 1912-07-26 DOA: 11/26/2014 PCP: Hollace Kinnier, DO   Brief Narrative:   Kendra Tapia is an 81 y.o. female with a PMH of abnormal gait, hypertension, hyperlipidemia, hypothyroidism, CAD, aortic stenosis, and a remote history of breast cancer status post left mastectomy who was admitted on 11/27/14 after she suffered from a mechanical fall resulting in a injury to her left clavicle.  Upon initial evaluation, she was found to have a mid thoracic vertebral body fracture of uncertain age, a possible inferior ramus fracture on the left, compression fractures at T8 and L1 which were chronic/stable but no left clavicular fracture or dislocation. She was also found to be hypoxic with oxygen saturations of 81% with ambulation and therefore referred for inpatient workup.  Assessment/Plan:   Principal Problem:   Oxygen desaturation / hypoxia  Chest x-ray negative for pneumonia.  BNP slightly elevated at 271. 2-D echo ordered.  Continue supplemental oxygen to maintain oxygen saturation.  Active Problems:   Coronary artery disease  EKG negative for acute ischemic changes. Troponin negative. No complaints of chest pain on admission.  Continue metoprolol and when necessary nitroglycerin.    Hypothyroidism  TSH was 3.93 on 11/30/13. Continue Synthroid.    Aortic stenosis  Follow-up 2-D echocardiogram results.    Hypertension  Continue amlodipine, Cozaar and metoprolol.    Macular degeneration (senile) of retina  Continue preservision.    Urinary incontinence  Continue Mirabegron.    Abnormality of gait / fall  Physical therapy evaluation.    Thoracic back pain in the setting of midthoracic vertebral body collapse, T8 and L1 chronic compression fractures  Chronic compression fractures of T8/L1 with a midthoracic vertebral body collapse. May need to consider vertebral plasty if pain cannot be adequately controlled and  mobility restored.    Osteoarthritis of both hips / possible left inferior ramus fracture  Provide pain control and physical therapy. Weightbearing as tolerated.    Anemia  Normocytic. Likely anemia of chronic disease.    Leukocytosis  No obvious source of infection. Chest x-ray negative for infiltrates. No complaints of dysuria suggestive of UTI.  Follow-up blood cultures.    DVT Prophylaxis  Continue subcutaneous heparin.  Code Status: DNR Family Communication: No family at the bedside. Disposition Plan: From an assisted living facility.   IV Access:    Peripheral IV   Procedures and diagnostic studies:   Dg Chest 2 View 11/26/2014 : ADDENDUM REPORT: 11/26/2014 20:38  ADDENDUM: Prior MRI of the thoracic spine from 2012 has become available. The T8 fracture is chronic and stable based on that study.  1.1 x 0.9 cm nodular opacity left perihilar region. If potential therapy would change based on a nodular opacity in the lung in this age group, it would be appropriate to consider noncontrast enhanced chest CT to further assess.  No edema or consolidation.  Marked collapse of a mid thoracic vertebral body of uncertain age. A recent fracture in this area cannot be excluded.   Dg Thoracic Spine W/swimmers 11/26/2014: Compression fractures at T8 and L1, chronic and stable. Scoliosis. No acute fracture. No spondylolisthesis. Mild osteoarthritic change.     Dg Pelvis 1-2 Views 11/26/2014: Question inferior ramus fracture on the left.     Dg Clavicle Left 11/26/2014: No fracture or dislocation.  No appreciable arthropathy.     Medical Consultants:    None.  Anti-Infectives:    None.  Subjective:   Kendra Tapia reports  that she feels well.  Denies current pain.  No dyspnea or cough.  Says she has not had much of an appetite but denies weight loss.    Objective:    Filed Vitals:   11/27/14 0153 11/27/14 0155 11/27/14 0458 11/27/14 0537  BP:   122/64 145/69  Pulse: 110  94 90 95  Temp:    97.7 F (36.5 C)  TempSrc:    Oral  Resp: 14   16  Height:    5' (1.524 m)  Weight:    46.8 kg (103 lb 2.8 oz)  SpO2: 81% 97% 98% 99%   No intake or output data in the 24 hours ending 11/27/14 0736  Exam: Gen:  NAD Cardiovascular:  RRR, III/VI SEM Respiratory:  Lungs CTAB Gastrointestinal:  Abdomen soft, NT/ND, + BS Extremities:  No C/E/C   Data Reviewed:    Labs: Basic Metabolic Panel:  Recent Labs Lab 11/27/14 0235  NA 127*  K 4.8  CL 100  CO2 20  GLUCOSE 140*  BUN 26*  CREATININE 1.07  CALCIUM 8.5   GFR Estimated Creatinine Clearance: 20.1 mL/min (by C-G formula based on Cr of 1.07). Liver Function Tests:  Recent Labs Lab 11/27/14 0235  AST 29  ALT 15  ALKPHOS 76  BILITOT 0.7  PROT 6.3  ALBUMIN 3.8   Coagulation profile  Recent Labs Lab 11/27/14 0635  INR 1.13    CBC:  Recent Labs Lab 11/27/14 0235  WBC 15.6*  NEUTROABS 14.5*  HGB 9.1*  HCT 27.5*  MCV 93.9  PLT 184   Cardiac Enzymes:  Recent Labs Lab 11/27/14 0235  TROPONINI 0.03   CBG:  Recent Labs Lab 11/27/14 0721  GLUCAP 125*   Microbiology No results found for this or any previous visit (from the past 240 hour(s)).   Medications:   . amLODipine  5 mg Oral Daily  . antiseptic oral rinse  7 mL Mouth Rinse BID  . calcitonin (salmon)  1 spray Alternating Nares Daily  . heparin  5,000 Units Subcutaneous 3 times per day  . levothyroxine  25 mcg Oral QAC breakfast  . losartan  50 mg Oral Daily  . metoprolol  50 mg Oral BID  . mirabegron ER  50 mg Oral Daily  . mirtazapine  7.5 mg Oral QHS  . multivitamin  1 tablet Oral Daily  . multivitamin with minerals  1 tablet Oral Daily  . naproxen  250 mg Oral Q breakfast  . polyethylene glycol  17 g Oral Daily  . polyvinyl alcohol  1 drop Both Eyes BID  . senna  2 tablet Oral Daily  . triamcinolone cream   Topical q morning - 10a   Continuous Infusions:   Time spent: 35 minutes with > 50% of time  discussing current diagnostic test results, clinical impression and plan of care.    LOS: 0 days   Torian Quintero  [facility] Pager [phone]. If unable to reach me by pager, please call my cell phone at [phone].  *Please refer to [email or web address], password [email or web address] to get updated schedule on who will round on this patient, as hospitalists switch teams weekly. If 7PM-7AM, please contact night-coverage at [email or web address], password [email or web address] for any overnight needs.  11/27/2014, 7:36 AM

## 2014-11-27 NOTE — H&P (Addendum)
[facility] History and Physical  Kendra Tapia MOQ:[id number] DOB: 1912/09/12 DOA: 11/26/2014  Referring physician: ED physician PCP: Hollace Kinnier, DO  Specialists:   Chief Complaint: Left clavicle injury and oxygen desaturation  HPI: Kendra Tapia is a 81 y.o. female with past medical history of abnormal gait, hypertension, hyperlipidemia, hypothyroidism, coronary artery disease, aortic stenosis, urinary incontinence, remote breast cancer (s/p of left mastectomy), who presents with  left clavicle injury and oxygen desaturation.  Patient is a resident of assisted living facility. She reports that she had a mechanical fall when she was trying to go to bed, and struck her left clavicle. She developed pain over her left clavicle. Symptoms are worse with movement. She did not pass out, no injury to head, no seizure activity. No unilateral weakness in her extremities. She also has chronic left hip pain which has changed in nature. She complains of sharp pain at her midthoracic spine. Denies any weakness in arms or legs. Denies any saddle anesthesias. EMS was called and patient was transported here. Patient denies fever, chills, headaches, cough, chest pain, SOB, abdominal pain, diarrhea, dysuria, urgency, frequency, hematuria or leg swelling. No vision change or hearing loss.    CXR showed marked collapse of a mid thoracic vertebral body of uncertain age. A recent fracture in this area cannot be excluded per radiologist  X-ray of left clavicle showed no fracture or dislocation.  X-ray of pelvis showed questionable inferior ramus fracture on the left.  X-ray of thoracic spine showed compression fractures at T8 and L1, which is chronic and stable, no acute fracture.   In ED, patient was found to have oxygen desaturation to 81% when patient was ambulated and was about to be discharged. Patient dose not have cough, chest pain or shortness of breath. Patient responded to nasal cannula  oxygen treatment, with oxygen saturation improved to 97%. Leukocytosis with WBC 15.6, BNP 271, chest x-ray has no pneumonia. Patient is admitted to inpatient for further evaluation and treatment.  Review of Systems: As presented in the history of presenting illness, rest negative.  Where does patient live?  Assistant living facility Can patient participate in ADLs? Non-  Allergy:  Allergies  Allergen Reactions  . Lipitor [Atorvastatin Calcium]     MUSCLE ACHES  . Micardis [Telmisartan]   . Other     Adhesive tape,chocolate,citrus,berriespaper tape    Past Medical History  Diagnosis Date  . IHD (ischemic heart disease)   . Nausea   . Coronary artery disease   . Hypothyroidism   . Hyperlipidemia   . Dizziness   . Exudative senile macular degeneration of retina 07/28/2012  . Tear film insufficiency, unspecified 07/28/2012  . Dermatophytosis of foot 05/10/2012  . Unspecified constipation 05/10/2012  . Unspecified urinary incontinence 05/10/2012  . Urethrocele(618.03) 02/11/2012  . Female stress incontinence 02/11/2012  . Depressive disorder, not elsewhere classified 11/10/2011  . Other malaise and fatigue 11/10/2011  . Loss of weight 11/10/2011  . Macular degeneration (senile) of retina, unspecified 09/24/2011  . Unspecified disorder of kidney and ureter 09/24/2011  . Pathologic fracture of vertebrae 09/24/2011  . Memory loss 09/24/2011  . Abnormality of gait 09/24/2011  . Nocturia 09/24/2011  . Malignant neoplasm of breast (female), unspecified site 08/13/2011  . Hyposmolality and/or hyponatremia 08/13/2011  . Anemia, unspecified 08/13/2011  . Closed fracture of unspecified part of vertebral column without mention of spinal cord injury 08/13/2011  . Personal history of fall 08/13/2011  . Hypertension   . Memory deficit 03/02/2011  2012: HAs STMl, repeats herself, some difficulkty with sequence of events. Functional status, language skills are  intact.    Past Surgical History   Procedure Laterality Date  . Coronary angioplasty  03/23/94    NORMAL LEFT VENTRICULAR SYSTOLIC FUNCTION, EF 74%  . Av fistula repair    . Hip surgery Left 10/07/2010    Dr. Rodell Perna    . Coronary angioplasty with stent placement      RIGHT CORONARY ARTERY  . Mastectomy Left     left     Social History:  reports that she quit smoking about 33 years ago. She has never used smokeless tobacco. She reports that she does not drink alcohol or use illicit drugs.  Family History:  Family History  Problem Relation Age of Onset  . Heart disease Mother   . Heart attack Father   . Cancer Sister   . Cancer Brother      Prior to Admission medications   Medication Sig Start Date End Date Taking? Authorizing Provider  acetaminophen (TYLENOL) 325 MG tablet Take 650 mg by mouth. Take two three times daily; take 2 tablets as needed for pain    Historical Provider, MD  amLODipine (NORVASC) 5 MG tablet Take one tablet daily for blood pressure 08/02/14   Historical Provider, MD  amoxicillin (AMOXIL) 500 MG capsule Take 4 capsules one hour prior to dental appointment 03/09/13   Historical Provider, MD  calcitonin, salmon, (MIACALCIN/FORTICAL) 200 UNIT/ACT nasal spray Place 1 spray into the nose daily. Alternate nostrils    Historical Provider, MD  cloNIDine (CATAPRES) 0.1 MG tablet Take 0.1 mg by mouth every 4 (four) hours as needed.      Historical Provider, MD  levothyroxine (SYNTHROID, LEVOTHROID) 25 MCG tablet Brand only, take one tablet daily 02/20/11   Darlin Coco, MD  losartan (COZAAR) 50 MG tablet Take 1 tablet (50 mg total) by mouth daily. 09/26/13   Darlin Coco, MD  metoprolol (LOPRESSOR) 50 MG tablet Take 50 mg by mouth 2 (two) times daily.    Historical Provider, MD  mirabegron ER (MYRBETRIQ) 50 MG TB24 Take 50 mg by mouth daily.    Historical Provider, MD  mirtazapine (REMERON) 7.5 MG tablet Take by mouth Daily. 02/02/12   Historical Provider, MD  Multiple Vitamin (MULTIVITAMIN)  tablet Take 1 tablet by mouth daily.    Historical Provider, MD  Multiple Vitamins-Minerals (PRESERVISION AREDS PO) Take 1 capsule by mouth. Twice a day    Historical Provider, MD  naproxen sodium (ALEVE) 220 MG tablet Take 1 tablet (220 mg total) by mouth daily. 03/01/14   Claudette Jeri Cos, NP  nitroGLYCERIN (NITROSTAT) 0.4 MG SL tablet Place 0.4 mg under the tongue every 5 (five) minutes as needed.      Historical Provider, MD  Polyethyl Glycol-Propyl Glycol (SYSTANE OP) Apply to eye. As directed    Historical Provider, MD  Polyethylene Glycol 3350 (MIRALAX PO) Take by mouth daily.      Historical Provider, MD  senna (SENOKOT) 8.6 MG tablet Take 2 tablets by mouth daily.    Historical Provider, MD  triamcinolone cream (KENALOG) 0.1 % Apply topically every morning.    Historical Provider, MD    Physical Exam: Filed Vitals:   11/27/14 0126 11/27/14 0153 11/27/14 0155 11/27/14 0458  BP: 127/63   122/64  Pulse: 94 110 94 90  Temp:      TempSrc:      Resp: 14 14    SpO2: 92% 81% 97% 98%  General: Not in acute distress HEENT:       Eyes: PERRL, EOMI, no scleral icterus       ENT: No discharge from the ears and nose, no pharynx injection, no tonsillar enlargement.        Neck: No JVD, no bruit, no mass felt. Cardiac: T2/W5, RRR, 3/6 systolic murmurs, No gallops or rubs Pulm: Good air movement bilaterally. Clear to auscultation bilaterally. No rales, wheezing, rhonchi or rubs. Abd: Soft, nondistended, nontender, no rebound pain, no organomegaly, BS present Ext: No edema bilaterally. 2+DP/PT pulse bilaterally Musculoskeletal: tenderness over hip bilateral hip joints (L>R), No joint deformities or leg rotation or shortening. Skin: bruise over left clavicle area and left elbow Neuro: Alert and oriented X3, cranial nerves II-XII grossly intact, muscle strength 4/5 in all extremeties, sensation to light touch intact. Brachial reflex 1+ bilaterally. Knee reflex 1+ bilaterally. Negative Babinski's  sign. Normal finger to nose test. Psych: Patient is not psychotic, no suicidal or hemocidal ideation.  Labs on Admission:  Basic Metabolic Panel:  Recent Labs Lab 11/27/14 0235  NA 127*  K 4.8  CL 100  CO2 20  GLUCOSE 140*  BUN 26*  CREATININE 1.07  CALCIUM 8.5   Liver Function Tests:  Recent Labs Lab 11/27/14 0235  AST 29  ALT 15  ALKPHOS 76  BILITOT 0.7  PROT 6.3  ALBUMIN 3.8   No results for input(s): LIPASE, AMYLASE in the last 168 hours. No results for input(s): AMMONIA in the last 168 hours. CBC:  Recent Labs Lab 11/27/14 0235  WBC 15.6*  NEUTROABS 14.5*  HGB 9.1*  HCT 27.5*  MCV 93.9  PLT 184   Cardiac Enzymes:  Recent Labs Lab 11/27/14 0235  TROPONINI 0.03    BNP (last 3 results)  Recent Labs  11/27/14 0235  BNP 271.5*    ProBNP (last 3 results) No results for input(s): PROBNP in the last 8760 hours.  CBG: No results for input(s): GLUCAP in the last 168 hours.  Radiological Exams on Admission: Dg Chest 2 View  11/26/2014   ADDENDUM REPORT: 11/26/2014 20:38  ADDENDUM: Prior MRI of the thoracic spine from 2012 has become available. The T8 fracture is chronic and stable based on that study.   Electronically Signed   By: Lowella Grip III M.D.   On: 11/26/2014 20:38   11/26/2014   CLINICAL DATA:  Pain after falling forward  EXAM: CHEST  2 VIEW  COMPARISON:  May 06, 2011  FINDINGS: There is marked collapse of a mid thoracic vertebral body which may be recent. There is no spondylolisthesis.  There is slight atelectasis in the left base. There is a focal nodular opacity lateral to the left hilum measuring 1.1 x 0.9 cm. Elsewhere, lungs are clear. Heart is upper normal in size with pulmonary vascularity within normal limits. There is atherosclerotic change in aorta.  IMPRESSION: 1.1 x 0.9 cm nodular opacity left perihilar region. If potential therapy would change based on a nodular opacity in the lung in this age group, it would be appropriate  to consider noncontrast enhanced chest CT to further assess.  No edema or consolidation.  Marked collapse of a mid thoracic vertebral body of uncertain age. A recent fracture in this area cannot be excluded.  Electronically Signed: By: Lowella Grip III M.D. On: 11/26/2014 20:35   Dg Thoracic Spine W/swimmers  11/26/2014   CLINICAL DATA:  Pain following fall  EXAM: THORACIC SPINE - 2 VIEW + SWIMMERS  COMPARISON:  Thoracic  MRI April 25, 2011  FINDINGS: Frontal, lateral, and swimmer's views were obtained. Marked collapse of the T8 vertebral body is a stable finding. There is milder anterior wedging of the L1 vertebral body, chronic and stable. No acute fracture. No spondylolisthesis. There is thoracolumbar levoscoliosis. There is mild disc space narrowing at multiple levels.  IMPRESSION: Compression fractures at T8 and L1, chronic and stable. Scoliosis. No acute fracture. No spondylolisthesis. Mild osteoarthritic change.   Electronically Signed   By: Lowella Grip III M.D.   On: 11/26/2014 20:37   Dg Pelvis 1-2 Views  11/26/2014   CLINICAL DATA:  [place] attempting to get in today at secondary to loss of balance. Left leg pain.  EXAM: PELVIS - 1-2 VIEW  COMPARISON:  None.  FINDINGS: Previous gamma nail treatment of a left femur fracture. Question subtle evidence of inferior ramus fracture on the left. No other sign of acute pelvic injury.  IMPRESSION: Question inferior ramus fracture on the left.   Electronically Signed   By: Nelson Chimes M.D.   On: 11/26/2014 20:37   Dg Clavicle Left  11/26/2014   CLINICAL DATA:  Patient fell forward  EXAM: LEFT CLAVICLE - 2+ VIEWS  COMPARISON:  None.  FINDINGS: Frontal and tilt frontal images were obtained. No fracture or dislocation. Joint spaces appear intact. No erosive change.  IMPRESSION: No fracture or dislocation.  No appreciable arthropathy.   Electronically Signed   By: Lowella Grip III M.D.   On: 11/26/2014 20:32    EKG: Independently reviewed. Left  axis deviation, mild T-wave inversion in V1 to V2 which existed on previous EKG on 10/11/12.  Assessment/Plan Principal Problem:   Oxygen desaturation Active Problems:   Coronary artery disease   Hypothyroidism   Aortic stenosis   Hypertension   Macular degeneration (senile) of retina   Urinary incontinence   Abnormality of gait   Thoracic back pain   Osteoarthritis of both hips   Anemia   Hypoxia   Hypothyroid   Fall   Leukocytosis  Oxygen desaturation: Etiology is not clear. It is likely due to hypoventilation secondary to tenderness in her chest wall. Patient's BNP is slightly elevated at 271, indicating possible congestive heart failure. No infiltration on chest x-ray for pneumonia. Pulmonary embolism is less likely given that patient does not have any chest pain. Patient responded to nasal cannula oxygen treatment, with oxygen back to 97%. No shortness of breath, chest pain or coughing currently. -admit to med-surg bed -Nasal cannula oxygen supply -Pain control -Check 2-D echo  Bone fracture: include marked collapse of a mid thoracic vertebral body, questionable inferior ramus fracture on the left and chronic compression fractures at T8 and L1. No neurovascular compromise.  -pain control: percocet, tylenol and home naproxen -May follow up with ortho -PT/OT  Hypothyroidism: TSH was 3.93 on 11/30/13. Patient is on Synthroid at home. -Continue Synthroid  Hypertension: -Continue amlodipine, Cozaar, metoprolol, -Patient is also on clonidine when necessary  Urinary incontinence: -Continue Mirabegron  Coronary artery disease: Patient does not have any chest pain, EKG has no new change. -continue metoprolol and when necessary nitroglycerin  Leukocytosis: WBC 15.6. There is no signs of infection identified. Chest x-ray has no infiltration.Patient is afebrile. Patient denies symptoms of UTI. It is most likely due to stress-induced de-margination. -Follow-up blood culture and  urinalysis    DVT ppx: SQ Heparin     Code Status: DNR Family Communication: None at bed side.  Disposition Plan: Admit to inpatient   Date of  Service 11/27/2014    Ivor Costa [facility] Pager [phone]  If 7PM-7AM, please contact night-coverage [email or web address] Password [facility] 11/27/2014, 5:35 AM

## 2014-11-27 NOTE — Progress Notes (Signed)
OT Cancellation Note  Patient Details Name: Kendra Tapia MRN: [id number] DOB: 1912/09/03   Cancelled Treatment:   Will defer OT eval to skilled rehab unit at [facility] Thanks,  Betsy Pries 11/27/2014, 3:37 PM

## 2014-11-27 NOTE — ED Notes (Signed)
Dr Lita Mains made aware of pt's oxygen saturation of 81% on RA and pt's inability to walk at this time.

## 2014-11-27 NOTE — ED Provider Notes (Signed)
While awaiting discharge patient began to desaturate into the 80s. RN noted patient's saturations were 81% while trying to ambulate. Patient with difficulty ambulating due to pain in the left hip likely due to ramus fracture. Chest x-ray without any obvious evidence of pneumonia. We'll do screening labs but patient will likely need to be admitted.  Mild elevation in white blood cell count. Mild hyponatremia. Discussed with hospitalist and will admit to MedSurg bed.  Julianne Rice, MD 11/27/14 [phone]

## 2014-11-27 NOTE — Progress Notes (Signed)
Clinical Social Work Department BRIEF PSYCHOSOCIAL ASSESSMENT 11/27/2014  Patient:  Kendra Tapia, Kendra Tapia     Account Number:  [id number]     Admit date:  11/26/2014  Clinical Social Worker:  Earlie Server  Date/Time:  11/27/2014 10:45 AM  Referred by:  Physician  Date Referred:  11/27/2014 Referred for  ALF Placement   Other Referral:   Interview type:  Family Other interview type:    PSYCHOSOCIAL DATA Living Status:  FACILITY Admitted from facility:  [facility] Level of care:  Assisted Living Primary support name:  Ben Primary support relationship to patient:  FAMILY Degree of support available:   Strong    CURRENT CONCERNS Current Concerns  Post-Acute Placement   Other Concerns:    SOCIAL WORK ASSESSMENT / PLAN CSW received referral due to patient being admitted from a facility. CSW went to room but patient sleeping and no family at bedside. CSW called [facility] [employer]) and explained CSW role.    Patient has been living at [facility] for about 3 years and 3 months. Patient has been doing well but [facility] reports that patient's cognitive ability had started to decline about 3 months ago. Patient has some additional assistance at ALF but [facility] reports this was first fall in awhile. [facility] reports he is unsure if patient should return to ALF or SNF and wants to see how patient progresses. CSW explained MD had ordered PT/OT and CSW can follow up after evaluations.    CSW spoke with [facility] who is aware of admission and reports they would like updates if patient and family are interested in skilled at DC. CSW completed FL2 and will continue to follow.   Assessment/plan status:  Psychosocial Support/Ongoing Assessment of Needs Other assessment/ plan:   Information/referral to community resources:   Will return to [facility] (ALF vs SNF?)    PATIENT'S/FAMILY'S RESPONSE TO PLAN OF CARE: Patient sleeping and did not participate in assessment. Patient's [facility] involved and  reports that he is happy that patient lives at [facility] because it is nice to know that patient is being managed effectively and safely. [facility] reports that patient has been doing well since she is a 81 years old but wants to make sure she returns to proper level of care. [facility] reports he is patient's only family and is trying to take the best care of patient but wants to follow her wishes as well. [facility] has CSW contact information if further needs arise.       [place], [place] [phone]

## 2014-11-27 NOTE — ED Notes (Signed)
Attempted to get pt up to use the bathroom with 2 person assist, pt unable to walk with assistance, c/o bilateral hip pain radiating down both thighs, worse on the left. Nurse at [facility] states pt normally walks with a walker and is able to get up by herself.

## 2014-11-27 NOTE — Evaluation (Addendum)
Physical Therapy Evaluation Patient Details Name: Kendra Tapia MRN: [id number] DOB: [birth year] Today's Date: 11/27/2014   History of Present Illness  Kendra Tapia is a 81 y.o. female adm with  After fall at ALF  with left clavicle injury and oxygen desaturation, per xrays-->possible  inf pubic ramus fx, thoracic comp fxs (chronic/stable)   past medical history of abnormal gait, hypertension, hyperlipidemia, hypothyroidism, coronary artery disease, aortic stenosis, urinary incontinence, remote breast cancer (s/p  left mastectomy),  Clinical Impression  Pt admitted with above diagnosis. Pt currently with functional limitations due to the deficits listed below (see PT Problem List).  Pt will benefit from skilled PT to increase their independence and safety with mobility to allow discharge to the venue listed below.  See eval for details, pt will likely need SNF placement post acute, she is disoriented and with word finding difficulty at time of PT eval; will continue to follow     Follow Up Recommendations SNF;Supervision/Assistance - 24 hour    Equipment Recommendations  None recommended by PT    Recommendations for Other Services       Precautions / Restrictions Precautions Precautions: Fall      Mobility  Bed Mobility Overal bed mobility: Needs Assistance Bed Mobility: Supine to Sit;Sit to Supine     Supine to sit: Max assist Sit to supine: Total assist;+2 for physical assistance;+2 for safety/equipment   General bed mobility comments: pt requires assist with LEs and trunk to come to sit, bed pad utilized to scoot laterally and to EOB, +2 for return to supine as pt with N/V and unable to self assist  Transfers                 General transfer comment: NT d/t N/V  Ambulation/Gait                Stairs            Wheelchair Mobility    Modified Rankin (Stroke Patients Only)       Balance Overall balance assessment: Needs assistance;History  of Falls Sitting-balance support: Bilateral upper extremity supported;Feet unsupported Sitting balance-Leahy Scale: Poor                                       Pertinent Vitals/Pain Pain Assessment: Faces Faces Pain Scale: Hurts little more Pain Location: L shoulder area Pain Descriptors / Indicators: Discomfort Pain Intervention(s): Limited activity within patient's tolerance;Monitored during session    Home Living Family/patient expects to be discharged to:: Assisted living               Home Equipment: Walker - 4 wheels      Prior Function Level of Independence: Independent with assistive device(s)         Comments: pt is not a good historian at this time and seems to have word finding difficulty     Hand Dominance        Extremity/Trunk Assessment   Upper Extremity Assessment: Generalized weakness           Lower Extremity Assessment: Generalized weakness         Communication   Communication: No difficulties  Cognition Arousal/Alertness: Awake/alert Behavior During Therapy: WFL for tasks assessed/performed Overall Cognitive Status: Impaired/Different from baseline Area of Impairment: Orientation;Memory;Problem solving Orientation Level: Disoriented to;Place;Situation   Memory: Decreased short-term memory       Problem Solving: Slow  processing;Decreased initiation;Difficulty sequencing;Requires verbal cues;Requires tactile cues General Comments: pt is unable to tell me if she is right or left handed, she does not know where she is at time of eval; she becomes naueous/sick but states she is "silly"  when motioning for bath basin    General Comments      Exercises        Assessment/Plan    PT Assessment Patient needs continued PT services  PT Diagnosis Generalized weakness   PT Problem List Decreased strength;Decreased activity tolerance;Decreased balance;Decreased mobility;Decreased safety awareness  PT Treatment  Interventions DME instruction;Gait training;Functional mobility training;Therapeutic activities;Patient/family education;Therapeutic exercise   PT Goals (Current goals can be found in the Care Plan section) Acute Rehab PT Goals Patient Stated Goal: pt does not state PT Goal Formulation: With patient Time For Goal Achievement: 12/11/14 Potential to Achieve Goals: Good    Frequency Min 3X/week   Barriers to discharge        Co-evaluation               End of Session   Activity Tolerance: Other (comment) (N/V) Patient left: with call bell/phone within reach;in bed;with bed alarm set;with nursing/sitter in room Nurse Communication: Mobility status         Time: 1231-1250 PT Time Calculation (min) (ACUTE ONLY): 19 min   Charges:   PT Evaluation $Initial PT Evaluation Tier I: 1 Procedure     PT G Codes:        Kona Yusuf 12/20/2014, 2:00 PM

## 2014-11-27 NOTE — Progress Notes (Signed)
  Echocardiogram 2D Echocardiogram has been performed.  Kendra Tapia 11/27/2014, 11:06 AM

## 2014-11-27 NOTE — Progress Notes (Signed)
CARE MANAGEMENT NOTE 11/27/2014  Patient:  Kendra Tapia, Kendra Tapia   Account Number:  [id number]  Date Initiated:  11/27/2014  Documentation initiated by:  Edwyna Shell  Subjective/Objective Assessment:   81 yo female admitted post fall at ALF and requiring O2 for O2 sats 81%     Action/Plan:   discharge planning   Anticipated DC Date:  11/29/2014   Anticipated DC Plan:  [facility]SISTED LIVING / [place]  CM consult      Choice offered to / List presented to:             Status of service:  In process, will continue to follow Medicare Important Message given?   (If response is "NO", the following Medicare IM given date fields will be blank) Date Medicare IM given:   Medicare IM given by:   Date Additional Medicare IM given:   Additional Medicare IM given by:    Discharge Disposition:    Per UR Regulation:    If discussed at Long Length of Stay Meetings, dates discussed:    Comments:  11/27/14 Edwyna Shell RN RN BSN CM [phone] Patient stated that she uses a walker at the [facility]. Await PT recommendations

## 2014-11-28 DIAGNOSIS — M546 Pain in thoracic spine: Secondary | ICD-10-CM

## 2014-11-28 LAB — GLUCOSE, CAPILLARY: Glucose-Capillary: 113 mg/dL — ABNORMAL HIGH (ref 70–99)

## 2014-11-28 MED ORDER — NAPROXEN 250 MG PO TABS: 250.0000 mg | ORAL_TABLET | Freq: Every day | ORAL | Status: DC

## 2014-11-28 MED ORDER — OXYCODONE-ACETAMINOPHEN 5-325 MG PO TABS: 1.0000 | ORAL_TABLET | Freq: Three times a day (TID) | ORAL | Status: DC | PRN

## 2014-11-28 NOTE — Progress Notes (Signed)
Clinical Social Work  CSW spoke with [facility] Suezanne Jacquet) who reports that he has spoken with [facility] and plans for patient to return to skilled care at [place]. Mora spoke with Rosendo Gros at [facility] who reports skilled bed is available for room 305 and they can accept patient back today. CSW text paged MD this information and will assist with transfer back to facility once DC paperwork is completed.   [place], [place] [phone]

## 2014-11-28 NOTE — Progress Notes (Addendum)
Progress Note   Kendra Tapia IEP:[id number] DOB: April 29, 1912 DOA: 11/26/2014 PCP: Hollace Kinnier, DO   Brief Narrative:   Kendra Tapia is an 81 y.o. female with a PMH of abnormal gait, hypertension, hyperlipidemia, hypothyroidism, CAD, aortic stenosis, and a remote history of breast cancer status post left mastectomy who was admitted on 11/27/14 after she suffered from a mechanical fall resulting in a injury to her left clavicle.  Upon initial evaluation, she was found to have a mid thoracic vertebral body fracture of uncertain age, a possible inferior ramus fracture on the left, compression fractures at T8 and L1 which were chronic/stable but no left clavicular fracture or dislocation. She was also found to be hypoxic with oxygen saturations of 81% with ambulation and therefore referred for inpatient workup.  Assessment/Plan:   Principal Problem:   Oxygen desaturation / hypoxia  Chest x-ray negative for pneumonia.  BNP slightly elevated at 271. 2-D echo shows EF 65-70 percent with grade 1 diastolic dysfunction and severe aortic stenosis.  Patient denies chest pain/dyspnea, and it is unlikely she is a candidate for intervention given her advanced age.  Continue supplemental oxygen to maintain oxygen saturation.  Active Problems:   Coronary artery disease  EKG negative for acute ischemic changes. Troponin negative. No complaints of chest pain on admission.  Continue metoprolol and when necessary nitroglycerin.    Hypothyroidism  TSH was 3.93 on 11/30/13. Continue Synthroid.    Aortic stenosis  Severe with a valve area of 0.76 cm.  Currently asymptomatic.  Has seen Dr. Mare Ferrari in the past.  She can follow up with him as an outpatient.    Hypertension  Continue amlodipine, Cozaar and metoprolol.    Macular degeneration (senile) of retina  Continue preservision.    Urinary incontinence  Continue Mirabegron.    Abnormality of gait / fall  Status post  physical therapy evaluation. SNF recommended.    Thoracic back pain in the setting of midthoracic vertebral body collapse, T8 and L1 chronic compression fractures  Chronic compression fractures of T8/L1 with a midthoracic vertebral body collapse. May need to consider vertebral plasty if pain cannot be adequately controlled and mobility restored.  Currently denies pain.    Osteoarthritis of both hips / possible left inferior ramus fracture  Provide pain control and physical therapy. Weightbearing as tolerated. Evaluated by physical therapy 11/27/14 with recommendations for 24-hour supervision and ongoing skilled PT.    Anemia  Normocytic. Likely anemia of chronic disease.    Leukocytosis  No obvious source of infection. Chest x-ray negative for infiltrates. No complaints of dysuria suggestive of UTI.  Follow-up blood cultures.  Refused labs this morning.    DVT Prophylaxis  Continue subcutaneous heparin.  Code Status: DNR Family Communication: No family at the bedside.  [name] [name] listed as emergency contact [phone]).  Message left.  Disposition Plan: From an assisted living facility.  Consult SW for SNF placment.  Can D/C to SNF when a bed is available.   IV Access:    Peripheral IV   Procedures and diagnostic studies:   Dg Chest 2 View 11/26/2014 : ADDENDUM REPORT: 11/26/2014 20:38  ADDENDUM: Prior MRI of the thoracic spine from 2012 has become available. The T8 fracture is chronic and stable based on that study.  1.1 x 0.9 cm nodular opacity left perihilar region. If potential therapy would change based on a nodular opacity in the lung in this age group, it would be appropriate to consider noncontrast enhanced chest  CT to further assess.  No edema or consolidation.  Marked collapse of a mid thoracic vertebral body of uncertain age. A recent fracture in this area cannot be excluded.   Dg Thoracic Spine W/swimmers 11/26/2014: Compression fractures at T8 and L1,  chronic and stable. Scoliosis. No acute fracture. No spondylolisthesis. Mild osteoarthritic change.     Dg Pelvis 1-2 Views 11/26/2014: Question inferior ramus fracture on the left.     Dg Clavicle Left 11/26/2014: No fracture or dislocation.  No appreciable arthropathy.     Medical Consultants:    None.  Anti-Infectives:    None.  Subjective:   Kendra Tapia is the same.  Denies dyspnea, pain, nausea or vomiting.  Poor appetite.  Nursing staff reports intermittent confusion.  Refused labs this morning.  Objective:    Filed Vitals:   11/27/14 2122 11/28/14 0031 11/28/14 0032 11/28/14 0555  BP: 119/63   132/79  Pulse: 82   77  Temp: 97.9 F (36.6 C)   97.9 F (36.6 C)  TempSrc: Oral   Oral  Resp: 18   18  Height:      Weight:    46.3 kg (102 lb 1.2 oz)  SpO2: 95% 88% 93% 95%   No intake or output data in the 24 hours ending 11/28/14 0806  Exam: Gen:  NAD Cardiovascular:  RRR, III/VI SEM Respiratory:  Lungs CTAB Gastrointestinal:  Abdomen soft, NT/ND, + BS Extremities:  No C/E/C   Data Reviewed:    Labs: Basic Metabolic Panel:  Recent Labs Lab 11/27/14 0235  NA 127*  K 4.8  CL 100  CO2 20  GLUCOSE 140*  BUN 26*  CREATININE 1.07  CALCIUM 8.5   GFR Estimated Creatinine Clearance: 20.1 mL/min (by C-G formula based on Cr of 1.07). Liver Function Tests:  Recent Labs Lab 11/27/14 0235  AST 29  ALT 15  ALKPHOS 76  BILITOT 0.7  PROT 6.3  ALBUMIN 3.8   Coagulation profile  Recent Labs Lab 11/27/14 0635  INR 1.13    CBC:  Recent Labs Lab 11/27/14 0235  WBC 15.6*  NEUTROABS 14.5*  HGB 9.1*  HCT 27.5*  MCV 93.9  PLT 184   Cardiac Enzymes:  Recent Labs Lab 11/27/14 0235  TROPONINI 0.03   CBG:  Recent Labs Lab 11/27/14 0721 11/28/14 0701  GLUCAP 125* 113*   Microbiology Recent Results (from the past 240 hour(s))  Culture, blood (routine x 2)     Status: None (Preliminary result)   Collection Time: 11/27/14  6:33 AM   Result Value Ref Range Status   Specimen Description BLOOD RIGHT ARM  Final   Special Requests BOTTLES DRAWN AEROBIC AND ANAEROBIC 10CC  Final   Culture   Final           BLOOD CULTURE RECEIVED NO GROWTH TO DATE CULTURE WILL BE HELD FOR 5 DAYS BEFORE ISSUING A FINAL NEGATIVE REPORT Performed at [facility]    Report Status PENDING  Incomplete  Culture, blood (routine x 2)     Status: None (Preliminary result)   Collection Time: 11/27/14  6:35 AM  Result Value Ref Range Status   Specimen Description BLOOD LEFT ARM  Final   Special Requests BOTTLES DRAWN AEROBIC AND ANAEROBIC 6CC  Final   Culture   Final           BLOOD CULTURE RECEIVED NO GROWTH TO DATE CULTURE WILL BE HELD FOR 5 DAYS BEFORE ISSUING A FINAL NEGATIVE REPORT Performed at [facility]    Report Status PENDING  Incomplete     Medications:   . amLODipine  5 mg Oral Daily  . antiseptic oral rinse  7 mL Mouth Rinse BID  . calcitonin (salmon)  1 spray Alternating Nares Daily  . heparin  5,000 Units Subcutaneous 3 times per day  . levothyroxine  25 mcg Oral QAC breakfast  . losartan  50 mg Oral Daily  . metoprolol  50 mg Oral BID  . mirabegron ER  50 mg Oral Daily  . mirtazapine  7.5 mg Oral QHS  . multivitamin  1 tablet Oral Daily  . multivitamin with minerals  1 tablet Oral Daily  . naproxen  250 mg Oral Q breakfast  . polyethylene glycol  17 g Oral Daily  . polyvinyl alcohol  1 drop Both Eyes BID  . senna  2 tablet Oral Daily  . triamcinolone cream   Topical q morning - 10a   Continuous Infusions:   Time spent: 25 minutes.    LOS: 1 day   Nyela Cortinas  [facility] Pager [phone]. If unable to reach me by pager, please call my cell phone at [phone].  *Please refer to [email or web address], password [email or web address] to get updated schedule on who will round on this patient, as hospitalists switch teams weekly. If 7PM-7AM, please contact night-coverage at [email or web address], password [email or web address] for any overnight  needs.  11/28/2014, 8:06 AM

## 2014-11-28 NOTE — Progress Notes (Addendum)
Clinical Social Work Department CLINICAL SOCIAL WORK PLACEMENT NOTE 11/28/2014  Patient:  Kendra Tapia, Kendra Tapia  Account Number:  [id number] Admit date:  11/26/2014  Clinical Social Worker:  Sindy Messing, LCSW  Date/time:  11/28/2014 10:45 AM  Clinical Social Work is seeking post-discharge placement for this patient at the following level of care:   SKILLED NURSING   (*CSW will update this form in Epic as items are completed)   11/28/2014  Patient/family provided with [place] Department of Clinical Social Work's list of facilities offering this level of care within the geographic area requested by the patient (or if unable, by the patient's family).  11/28/2014  Patient/family informed of their freedom to choose among providers that offer the needed level of care, that participate in Medicare, Medicaid or managed care program needed by the patient, have an available bed and are willing to accept the patient.  11/28/2014  Patient/family informed of [facility]' ownership interest in [facility], as well as of the fact that they are under no obligation to receive care at this facility.  PASARR submitted to EDS on existing # PASARR number received on   FL2 transmitted to all facilities in geographic area requested by pt/family on  11/28/2014 FL2 transmitted to all facilities within larger geographic area on   Patient informed that his/her managed care company has contracts with or will negotiate with  certain facilities, including the following:     Patient/family informed of bed offers received:  11/28/2014 Patient chooses bed at [facility] Physician recommends and patient chooses bed at    Patient to be transferred to [facility] on  11/28/14 Patient to be transferred to facility by [facility] Patient and family notified of transfer on 11/28/14 Name of family member notified:  HCPOA-Ben via phone  The following physician request were entered in Epic:   Additional  Comments:

## 2014-11-28 NOTE — Discharge Summary (Signed)
Physician Discharge Summary  Kendra Tapia JIR:[id number] DOB: Jun 01, 1912 DOA: 11/26/2014  PCP: Hollace Kinnier, DO  Admit date: 11/26/2014 Discharge date: 11/28/2014   Recommendations for Outpatient Follow-Up:   1. Recommend outpatient follow up with Dr. Mare Ferrari, her cardiologist. 2. F/U final blood cultures, negative to date.  Discharge Diagnosis:   Principal Problem:    Oxygen desaturation Active Problems:    Coronary artery disease    Hypothyroidism    Aortic stenosis    Hypertension    Macular degeneration (senile) of retina    Urinary incontinence    Abnormality of gait    Thoracic back pain    Osteoarthritis of both hips    Anemia    Hypoxia    Hypothyroid    Fall    Leukocytosis   Discharge Condition: Stable.  Diet recommendation: Low sodium, heart healthy.   History of Present Illness:   Kendra Tapia is an 81 y.o. female with a PMH of abnormal gait, hypertension, hyperlipidemia, hypothyroidism, CAD, aortic stenosis, and a remote history of breast cancer status post left mastectomy who was admitted on 11/27/14 after she suffered from a mechanical fall resulting in a injury to her left clavicle. Upon initial evaluation, she was found to have a mid thoracic vertebral body fracture of uncertain age, a possible inferior ramus fracture on the left, compression fractures at T8 and L1 which were chronic/stable but no left clavicular fracture or dislocation. She was also found to be hypoxic with oxygen saturations of 81% with ambulation and therefore referred for inpatient workup.   Hospital Course by Problem:   Principal Problem:  Oxygen desaturation / hypoxia  Chest x-ray negative for pneumonia.  BNP slightly elevated at 271. 2-D echo shows EF 65-70 percent with grade 1 diastolic dysfunction and severe aortic stenosis. Patient denies chest pain/dyspnea, and it is unlikely she is a candidate for intervention given her advanced  age.  Continue supplemental oxygen to maintain oxygen saturation.  Active Problems:  Coronary artery disease  EKG negative for acute ischemic changes. Troponin negative. No complaints of chest pain on admission.  Continue metoprolol and when necessary nitroglycerin.   Hypothyroidism  TSH was 3.93 on 11/30/13. Continue Synthroid.   Aortic stenosis  Severe with a valve area of 0.76 cm.  Currently asymptomatic. Has seen Dr. Mare Ferrari in the past. She can follow up with him as an outpatient.   Hypertension  Continue amlodipine, Cozaar and metoprolol.   Macular degeneration (senile) of retina  Continue preservision.   Urinary incontinence  Continue Mirabegron.   Abnormality of gait / fall  Status post physical therapy evaluation. SNF recommended.   Thoracic back pain in the setting of midthoracic vertebral body collapse, T8 and L1 chronic compression fractures  Chronic compression fractures of T8/L1 with a midthoracic vertebral body collapse. May need to consider vertebroplasty if pain cannot be adequately controlled and mobility restored. Currently denies pain.   Osteoarthritis of both hips / possible left inferior ramus fracture  Provide pain control and physical therapy. Weightbearing as tolerated. Evaluated by physical therapy 11/27/14 with recommendations for 24-hour supervision and ongoing skilled PT.   Anemia  Normocytic. Likely anemia of chronic disease.   Leukocytosis  No obvious source of infection. Chest x-ray negative for infiltrates. No complaints of dysuria suggestive of UTI.  Follow-up blood cultures.  Refused labs this morning.    Medical Consultants:    None.   Discharge Exam:   Filed Vitals:   11/28/14 0555  BP: 132/79  Pulse: 77  Temp: 97.9 F (36.6 C)  Resp: 18   Filed Vitals:   11/27/14 2122 11/28/14 0031 11/28/14 0032 11/28/14 0555  BP: 119/63   132/79  Pulse: 82   77  Temp: 97.9 F (36.6 C)   97.9 F (36.6  C)  TempSrc: Oral   Oral  Resp: 18   18  Height:      Weight:    46.3 kg (102 lb 1.2 oz)  SpO2: 95% 88% 93% 95%    Gen: NAD Cardiovascular: RRR, III/VI SEM Respiratory: Lungs CTAB Gastrointestinal: Abdomen soft, NT/ND, + BS Extremities: No C/E/C   The results of significant diagnostics from this hospitalization (including imaging, microbiology, ancillary and laboratory) are listed below for reference.     Procedures and Diagnostic Studies:   Dg Chest 2 View 11/26/2014 : ADDENDUM REPORT: 11/26/2014 20:38 ADDENDUM: Prior MRI of the thoracic spine from 2012 has become available. The T8 fracture is chronic and stable based on that study. 1.1 x 0.9 cm nodular opacity left perihilar region. If potential therapy would change based on a nodular opacity in the lung in this age group, it would be appropriate to consider noncontrast enhanced chest CT to further assess. No edema or consolidation. Marked collapse of a mid thoracic vertebral body of uncertain age. A recent fracture in this area cannot be excluded.   Dg Thoracic Spine W/swimmers 11/26/2014: Compression fractures at T8 and L1, chronic and stable. Scoliosis. No acute fracture. No spondylolisthesis. Mild osteoarthritic change.   Dg Pelvis 1-2 Views 11/26/2014: Question inferior ramus fracture on the left.   Dg Clavicle Left 11/26/2014: No fracture or dislocation. No appreciable arthropathy.    Labs:   Basic Metabolic Panel:  Recent Labs Lab 11/27/14 0235  NA 127*  K 4.8  CL 100  CO2 20  GLUCOSE 140*  BUN 26*  CREATININE 1.07  CALCIUM 8.5   GFR Estimated Creatinine Clearance: 20.1 mL/min (by C-G formula based on Cr of 1.07). Liver Function Tests:  Recent Labs Lab 11/27/14 0235  AST 29  ALT 15  ALKPHOS 76  BILITOT 0.7  PROT 6.3  ALBUMIN 3.8   Coagulation profile  Recent Labs Lab 11/27/14 0635  INR 1.13    CBC:  Recent Labs Lab 11/27/14 0235  WBC 15.6*  NEUTROABS 14.5*  HGB 9.1*  HCT  27.5*  MCV 93.9  PLT 184   Cardiac Enzymes:  Recent Labs Lab 11/27/14 0235  TROPONINI 0.03   CBG:  Recent Labs Lab 11/27/14 0721 11/28/14 0701  GLUCAP 125* 113*   Microbiology Recent Results (from the past 240 hour(s))  Culture, blood (routine x 2)     Status: None (Preliminary result)   Collection Time: 11/27/14  6:33 AM  Result Value Ref Range Status   Specimen Description BLOOD RIGHT ARM  Final   Special Requests BOTTLES DRAWN AEROBIC AND ANAEROBIC 10CC  Final   Culture   Final           BLOOD CULTURE RECEIVED NO GROWTH TO DATE CULTURE WILL BE HELD FOR 5 DAYS BEFORE ISSUING A FINAL NEGATIVE REPORT Performed at [facility]    Report Status PENDING  Incomplete  Culture, blood (routine x 2)     Status: None (Preliminary result)   Collection Time: 11/27/14  6:35 AM  Result Value Ref Range Status   Specimen Description BLOOD LEFT ARM  Final   Special Requests BOTTLES DRAWN AEROBIC AND ANAEROBIC [facility]  Final   Culture   Final  BLOOD CULTURE RECEIVED NO GROWTH TO DATE CULTURE WILL BE HELD FOR 5 DAYS BEFORE ISSUING A FINAL NEGATIVE REPORT Performed at [facility]    Report Status PENDING  Incomplete     Discharge Instructions:   Discharge Instructions    Call MD for:  extreme fatigue    Complete by:  As directed      Call MD for:  persistant nausea and vomiting    Complete by:  As directed      Call MD for:  severe uncontrolled pain    Complete by:  As directed      Call MD for:  temperature >100.4    Complete by:  As directed      Diet - low sodium heart healthy    Complete by:  As directed      Discharge instructions    Complete by:  As directed   You were cared for by Dr. Jacquelynn Cree  (a hospitalist) during your hospital stay. If you have any questions about your discharge medications or the care you received while you were in the hospital after you are discharged, you can call the unit and ask to speak with the hospitalist on call  if the hospitalist that took care of you is not available. Once you are discharged, your primary care physician will handle any further medical issues. Please note that NO REFILLS for any discharge medications will be authorized once you are discharged, as it is imperative that you return to your primary care physician (or establish a relationship with a primary care physician if you do not have one) for your aftercare needs so that they can reassess your need for medications and monitor your lab values.  Any outstanding tests can be reviewed by your PCP at your follow up visit.  It is also important to review any medicine changes with your PCP.  Please bring these d/c instructions with you to your next visit so your physician can review these changes with you.  If you do not have a primary care physician, you can call [phone] for a physician referral.  It is highly recommended that you obtain a PCP for hospital follow up.     For home use only DME oxygen    Complete by:  As directed   Mode or (Route):  Nasal cannula  Liters per Minute:  2  Frequency:  Continuous (stationary and portable oxygen unit needed)  Oxygen delivery system:  Gas     Increase activity slowly    Complete by:  As directed      Walk with assistance    Complete by:  As directed      Walker     Complete by:  As directed             Medication List    TAKE these medications        acetaminophen 325 MG tablet  Commonly known as:  TYLENOL  Take 650 mg by mouth every 8 (eight) hours as needed for moderate pain. Pain     amLODipine 5 MG tablet  Commonly known as:  NORVASC  Take 10 mg by mouth daily. Take one tablet daily for blood pressure     calcitonin (salmon) 200 UNIT/ACT nasal spray  Commonly known as:  MIACALCIN/FORTICAL  Place 1 spray into the nose daily. Alternate nostrils     losartan 50 MG tablet  Commonly known as:  COZAAR  Take 1 tablet (50 mg total) by  mouth daily.     metoprolol 50 MG tablet    Commonly known as:  LOPRESSOR  Take 50 mg by mouth 2 (two) times daily.     mirabegron ER 50 MG Tb24 tablet  Commonly known as:  MYRBETRIQ  Take 50 mg by mouth daily.     mirtazapine 7.5 MG tablet  Commonly known as:  REMERON  Take 7.5 mg by mouth Daily.     naproxen 250 MG tablet  Commonly known as:  NAPROSYN  Take 1 tablet (250 mg total) by mouth daily with breakfast.     oxyCODONE-acetaminophen 5-325 MG per tablet  Commonly known as:  PERCOCET/ROXICET  Take 1 tablet by mouth every 8 (eight) hours as needed for moderate pain.     Polyethyl Glycol-Propyl Glycol 0.4-0.3 % Soln  Apply 1 drop to eye 4 (four) times daily - after meals and at bedtime.     polyethylene glycol packet  Commonly known as:  MIRALAX / GLYCOLAX  Take 17 g by mouth daily.     PRESERVISION AREDS PO  Take 1 capsule by mouth. Twice a day     sennosides-docusate sodium 8.6-50 MG tablet  Commonly known as:  SENOKOT-S  Take 2 tablets by mouth at bedtime.          Time coordinating discharge: 35 minutes.  Signed:  Kayleah Appleyard  Pager [phone] [facility] 11/28/2014, 12:53 PM

## 2014-11-28 NOTE — Progress Notes (Signed)
UR completed 

## 2014-11-28 NOTE — Progress Notes (Signed)
Clinical Social Work  CSW faxed DC summary to [facility] who is agreeable to accept patient today. RN to call report to SNF. CSW prepared DC packet with FL2, DC summary, DNR, [facility] forms and hard scripts included. CSW made patient and HCPOA aware of DC plans. HCPOA reports he is pleased that patient is going to skilled and feels she will get better care with having 24/7 supervision. Patient pleasantly confused but agreeable to plans. HCPOA prefers [facility] for transportation and aware of no guarantee of payment. [facility] #: [id number].  CSW is signing off but available if needed.  [place], [place] [phone]

## 2014-11-28 NOTE — Progress Notes (Signed)
Gave report to [place] at [facility]. Left number if she had additional questions.

## 2014-11-29 ENCOUNTER — Encounter (HOSPITAL_COMMUNITY): Payer: Self-pay

## 2014-11-29 ENCOUNTER — Non-Acute Institutional Stay (SKILLED_NURSING_FACILITY): Payer: Medicare Other | Admitting: Internal Medicine

## 2014-11-29 ENCOUNTER — Other Ambulatory Visit: Payer: Self-pay | Admitting: Internal Medicine

## 2014-11-29 ENCOUNTER — Ambulatory Visit (HOSPITAL_COMMUNITY): Payer: Medicare Other

## 2014-11-29 ENCOUNTER — Ambulatory Visit (HOSPITAL_COMMUNITY)
Admission: RE | Admit: 2014-11-29 | Discharge: 2014-11-29 | Disposition: A | Discharge status: Home / Self Care | Payer: Medicare Other | Source: Home / Self Care | Attending: Emergency Medicine | Admitting: Emergency Medicine

## 2014-11-29 ENCOUNTER — Ambulatory Visit (HOSPITAL_COMMUNITY)
Admission: RE | Admit: 2014-11-29 | Discharge: 2014-11-29 | Disposition: A | Discharge status: Home / Self Care | Payer: Medicare Other | Source: Ambulatory Visit | Attending: Internal Medicine | Admitting: Internal Medicine

## 2014-11-29 DIAGNOSIS — S40012A Contusion of left shoulder, initial encounter: Principal | ICD-10-CM | POA: Insufficient documentation

## 2014-11-29 DIAGNOSIS — E039 Hypothyroidism, unspecified: Secondary | ICD-10-CM | POA: Insufficient documentation

## 2014-11-29 DIAGNOSIS — I10 Essential (primary) hypertension: Secondary | ICD-10-CM | POA: Diagnosis not present

## 2014-11-29 DIAGNOSIS — R0902 Hypoxemia: Secondary | ICD-10-CM | POA: Diagnosis not present

## 2014-11-29 DIAGNOSIS — S72009A Fracture of unspecified part of neck of unspecified femur, initial encounter for closed fracture: Secondary | ICD-10-CM | POA: Insufficient documentation

## 2014-11-29 DIAGNOSIS — I25119 Atherosclerotic heart disease of native coronary artery with unspecified angina pectoris: Secondary | ICD-10-CM | POA: Diagnosis not present

## 2014-11-29 DIAGNOSIS — R32 Unspecified urinary incontinence: Secondary | ICD-10-CM | POA: Insufficient documentation

## 2014-11-29 DIAGNOSIS — N183 Chronic kidney disease, stage 3 unspecified: Secondary | ICD-10-CM

## 2014-11-29 DIAGNOSIS — Y92009 Unspecified place in unspecified non-institutional (private) residence as the place of occurrence of the external cause: Secondary | ICD-10-CM

## 2014-11-29 DIAGNOSIS — R29818 Other symptoms and signs involving the nervous system: Secondary | ICD-10-CM | POA: Diagnosis not present

## 2014-11-29 DIAGNOSIS — T148XXA Other injury of unspecified body region, initial encounter: Secondary | ICD-10-CM

## 2014-11-29 DIAGNOSIS — I350 Nonrheumatic aortic (valve) stenosis: Secondary | ICD-10-CM | POA: Diagnosis not present

## 2014-11-29 DIAGNOSIS — M810 Age-related osteoporosis without current pathological fracture: Secondary | ICD-10-CM | POA: Diagnosis not present

## 2014-11-29 DIAGNOSIS — I2699 Other pulmonary embolism without acute cor pulmonale: Secondary | ICD-10-CM

## 2014-11-29 DIAGNOSIS — I2510 Atherosclerotic heart disease of native coronary artery without angina pectoris: Secondary | ICD-10-CM | POA: Insufficient documentation

## 2014-11-29 DIAGNOSIS — R911 Solitary pulmonary nodule: Secondary | ICD-10-CM | POA: Diagnosis not present

## 2014-11-29 DIAGNOSIS — I509 Heart failure, unspecified: Secondary | ICD-10-CM | POA: Insufficient documentation

## 2014-11-29 DIAGNOSIS — Y92122 Bedroom in nursing home as the place of occurrence of the external cause: Secondary | ICD-10-CM | POA: Insufficient documentation

## 2014-11-29 DIAGNOSIS — E871 Hypo-osmolality and hyponatremia: Secondary | ICD-10-CM | POA: Diagnosis not present

## 2014-11-29 DIAGNOSIS — W19XXXA Unspecified fall, initial encounter: Secondary | ICD-10-CM | POA: Insufficient documentation

## 2014-11-29 DIAGNOSIS — Z87891 Personal history of nicotine dependence: Secondary | ICD-10-CM | POA: Insufficient documentation

## 2014-11-29 DIAGNOSIS — W19XXXD Unspecified fall, subsequent encounter: Secondary | ICD-10-CM | POA: Diagnosis not present

## 2014-11-29 DIAGNOSIS — Z79899 Other long term (current) drug therapy: Secondary | ICD-10-CM | POA: Insufficient documentation

## 2014-11-29 DIAGNOSIS — W06XXXA Fall from bed, initial encounter: Secondary | ICD-10-CM | POA: Insufficient documentation

## 2014-11-29 DIAGNOSIS — Z888 Allergy status to other drugs, medicaments and biological substances status: Secondary | ICD-10-CM | POA: Insufficient documentation

## 2014-11-29 DIAGNOSIS — R4189 Other symptoms and signs involving cognitive functions and awareness: Secondary | ICD-10-CM

## 2014-11-29 DIAGNOSIS — E785 Hyperlipidemia, unspecified: Secondary | ICD-10-CM | POA: Insufficient documentation

## 2014-11-29 LAB — BASIC METABOLIC PANEL
BUN: 38 mg/dL — AB (ref 4–21)
CREATININE: 1.4 mg/dL — AB (ref 0.5–1.1)
Glucose: 96 mg/dL
POTASSIUM: 5.1 mmol/L (ref 3.4–5.3)
Sodium: 136 mmol/L — AB (ref 137–147)

## 2014-11-29 MED ORDER — IOHEXOL 350 MG/ML SOLN
80.0000 mL | Freq: Once | INTRAVENOUS | Status: AC | PRN
  Administered 2014-11-29: 80 mL via INTRAVENOUS

## 2014-11-29 NOTE — Progress Notes (Signed)
Patient ID: Kendra Tapia, female   DOB: Mar 30, 1912, 81 y.o.   MRN: [id number]  Provider:  Rexene Edison. Mariea Clonts, D.O., C.M.D.  Location:  [facility]   PCP: Hollace Kinnier, DO  Code Status: DNR Advanced Directive information Does patient have an advance directive?: Yes, Type of Advance Directive: Healthcare Power of Attorney  Allergies  Allergen Reactions  . Lipitor [Atorvastatin Calcium]     MUSCLE ACHES  . Micardis [Telmisartan]   . Other     Adhesive tape,chocolate,citrus,berriespaper tape    Chief Complaint  Patient presents with  . New Admit To SNF    s/p fall with left hip and shoulder pain, found to be hypoxic    HPI: 81 y.o. female previously residing in assisted living, was transferred to hospital due to fall and hypoxia. Now admitted to skilled care memory unit due to increased need for assistance with ADL's.   Pt pleasantly confused, not oriented to person, place, or time. Referred several times to her Grandfather who she said wanted her to be there. She states she does remember her fall, and is aware that she returned to a different place than where she was residing before her hospitalization. Most recent MMSE from April 2015 scored 19/30. Nursing is to repeat testing today.  Workup for hypoxia in hospital revealed questionable etiology. There was a perihilar nodule noted on chest xray, and followup CT scan was recommended but not completed. Fall resulted in questionable left inferior ramus fracture and contusions on left clavicle. Nursing reports patient O2 Sat continues to drop to 81% off of oxygen.  Per staff report pt requires two person assist for transfers. She is not ambulating. Continent, but requires assistance with toileting. Needs assistance with dressing and bathing. She is able to feed herself, but refused dinner and only ate a few bites of cereal for breakfast. She states she just doesn't want it, not hungry, and will eat when  she feels better.   She reports pain in her left shoulder and hip area. Nursing is administering daily alleve and tylenol, with percocet PRN. PT/OT eval has been ordered and is pending.     ROS: Review of Systems  Constitutional: Negative for fever and chills.       Per nursing  Respiratory: Negative for cough and shortness of breath.   Cardiovascular: Negative for chest pain.  Gastrointestinal: Negative for abdominal pain.  Musculoskeletal: Positive for joint pain and falls.  Neurological: Negative for dizziness and tingling.  Psychiatric/Behavioral: Positive for memory loss. Negative for depression and hallucinations. The patient is not nervous/anxious.    Past Medical History  Diagnosis Date  . IHD (ischemic heart disease)   . Nausea   . Coronary artery disease   . Hypothyroidism   . Hyperlipidemia   . Dizziness   . Exudative senile macular degeneration of retina 07/28/2012  . Tear film insufficiency, unspecified 07/28/2012  . Dermatophytosis of foot 05/10/2012  . Unspecified constipation 05/10/2012  . Unspecified urinary incontinence 05/10/2012  . Urethrocele(618.03) 02/11/2012  . Female stress incontinence 02/11/2012  . Depressive disorder, not elsewhere classified 11/10/2011  . Other malaise and fatigue 11/10/2011  . Loss of weight 11/10/2011  . Macular degeneration (senile) of retina, unspecified 09/24/2011  . Unspecified disorder of kidney and ureter 09/24/2011  . Pathologic fracture of vertebrae 09/24/2011  . Memory loss 09/24/2011  . Abnormality of gait 09/24/2011  . Nocturia 09/24/2011  . Malignant neoplasm of breast (female), unspecified site 08/13/2011  .  Hyposmolality and/or hyponatremia 08/13/2011  . Anemia, unspecified 08/13/2011  . Closed fracture of unspecified part of vertebral column without mention of spinal cord injury 08/13/2011  . Personal history of fall 08/13/2011  . Hypertension   . Memory deficit 03/02/2011     2012: HAs STMl, repeats herself, some  difficulkty with sequence of events. Functional status, language skills are  intact.   Past Surgical History  Procedure Laterality Date  . Coronary angioplasty  03/23/94    NORMAL LEFT VENTRICULAR SYSTOLIC FUNCTION, EF 75%  . Av fistula repair    . Hip surgery Left 10/07/2010    Dr. Rodell Perna    . Coronary angioplasty with stent placement      RIGHT CORONARY ARTERY  . Mastectomy Left     left    Social History:   reports that she quit smoking about 33 years ago. She has never used smokeless tobacco. She reports that she does not drink alcohol or use illicit drugs.  Family History  Problem Relation Age of Onset  . Heart disease Mother   . Heart attack Father   . Cancer Sister   . Cancer Brother     Medications: Patient's Medications  New Prescriptions   No medications on file  Previous Medications   ACETAMINOPHEN (TYLENOL) 325 MG TABLET    Take 650 mg by mouth every 8 (eight) hours as needed for moderate pain. Pain   AMLODIPINE (NORVASC) 5 MG TABLET    Take 10 mg by mouth daily. Take one tablet daily for blood pressure   CALCITONIN, SALMON, (MIACALCIN/FORTICAL) 200 UNIT/ACT NASAL SPRAY    Place 1 spray into the nose daily. Alternate nostrils   LOSARTAN (COZAAR) 50 MG TABLET    Take 1 tablet (50 mg total) by mouth daily.   METOPROLOL (LOPRESSOR) 50 MG TABLET    Take 50 mg by mouth 2 (two) times daily.   MIRABEGRON ER (MYRBETRIQ) 50 MG TB24    Take 50 mg by mouth daily.   MIRTAZAPINE (REMERON) 7.5 MG TABLET    Take 7.5 mg by mouth Daily.    MULTIPLE VITAMINS-MINERALS (PRESERVISION AREDS PO)    Take 1 capsule by mouth. Twice a day   OXYCODONE-ACETAMINOPHEN (PERCOCET/ROXICET) 5-325 MG PER TABLET    Take 1 tablet by mouth every 8 (eight) hours as needed for moderate pain.   POLYETHYL GLYCOL-PROPYL GLYCOL 0.4-0.3 % SOLN    Apply 1 drop to eye 4 (four) times daily - after meals and at bedtime.   POLYETHYLENE GLYCOL (MIRALAX / GLYCOLAX) PACKET    Take 17 g by mouth daily.    SENNOSIDES-DOCUSATE SODIUM (SENOKOT-S) 8.6-50 MG TABLET    Take 2 tablets by mouth at bedtime.  Modified Medications   No medications on file  Discontinued Medications   NAPROXEN (NAPROSYN) 250 MG TABLET    Take 1 tablet (250 mg total) by mouth daily with breakfast.     Physical Exam: Filed Vitals:   11/29/14 1034  BP: 117/62  Pulse: 70  Temp: 97.9 F (36.6 C)  Resp: 20  SpO2: 97%   Physical Exam  Cardiovascular: Normal rate and regular rhythm.   Murmur heard. Pulmonary/Chest: Breath sounds normal.  Noted increased respiratory rate, some increased work of breathing  Abdominal: Soft. Bowel sounds are normal. She exhibits no distension. There is no tenderness.  Musculoskeletal: She exhibits tenderness. She exhibits no edema.  Weakness, pain and tenderness in left hip area; unable to lift left leg (flex hip)  Neurological: She is  alert.  Conversant, can provide some history, but also has short term memory loss  Skin: Skin is warm and dry.  Contusions to left clavicle  Psychiatric: She has a normal mood and affect. Her behavior is normal.    Labs reviewed: Basic Metabolic Panel:  Recent Labs  09/13/14 11/27/14 0235 11/29/14  NA 134* 127* 136*  K 4.6 4.8 5.1  CL  --  100  --   CO2  --  20  --   GLUCOSE  --  140*  --   BUN 24* 26* 38*  CREATININE 1.1 1.07 1.4*  CALCIUM  --  8.5  --    BMET from this morning is pending. Liver Function Tests:  Recent Labs  05/23/14 11/27/14 0235  AST 16 29  ALT 10 15  ALKPHOS 60 76  BILITOT  --  0.7  PROT  --  6.3  ALBUMIN  --  3.8   No results for input(s): LIPASE, AMYLASE in the last 8760 hours. No results for input(s): AMMONIA in the last 8760 hours. CBC:  Recent Labs  05/23/14 09/13/14 11/27/14 0235  WBC 4.8 4.8 15.6*  NEUTROABS  --   --  14.5*  HGB 9.9* 8.8* 9.1*  HCT 29* 27* 27.5*  MCV  --   --  93.9  PLT 209 229 184   Cardiac Enzymes:  Recent Labs  11/27/14 0235  TROPONINI 0.03   BNP: Invalid input(s):  POCBNP CBG:  Recent Labs  11/27/14 0721 11/28/14 0701  GLUCAP 125* 113*    Imaging and Procedures: 11/26/14: xrays thoracic spine w/ swimmers:  Compression fractures at T8 and L1, chronic and stable. Scoliosis. No acute fracture. No spondylolisthesis. Mild osteoarthritic change. xrays pelvis:Question inferior ramus fracture on the left. CXR:  Marked collapse of a mid thoracic vertebral body (T8 was already present in 2012) Left clavicle xrays:  No fracture or dislocation. No appreciable arthropathy.  Assessment/Plan 1. Neurocognitive deficits--she has dementia, but diagnosis not in chart--no recent brain imaging, likely Alzheimer's disease--has been declining cognitively MMSE - Mini Mental State Exam 02/26/2014 12/02/2012  Orientation to time 4 4  Orientation to Place 4 1  Registration 3 3  Attention/ Calculation 1 5  Recall 1 1  Language- name 2 objects 2 2  Language- repeat 1 1  Language- follow 3 step command 3 3  Language- read & follow direction 1 1  Write a sentence 1 1  Copy design 1 0  Total score 22 22  -was functioning with assistance in AL environment, but now with left hip contusion and left clavicle injury, she is dependent except feeding  Remain in memory care unit at [facility]--goal is for this to be long term per staff.  2. Hypoxia Continue 02. Monitor oxygen status and respiration rate. Etiology is unclear.  Review of complete xray report shows she has a pulmonary nodule, but not large enough to explain hypoxia.  ekg was unremarkable. CT was not done while she was at the hospital.  Hypoxia could be due to poor inspiration b/c of pain in clavicular area/contusion of chest, but would like to rule out a PE--discussed with her nephew, Suezanne Jacquet, who agrees with CT.  This will also help to characterize the nodule more clearly though intervention and investigation would not be appropriate at this time.  She also has severe AS, but has been asymptomatic prior to this  event.  3. Fall at home, subsequent encounter with left hip contusion, left clavicle contusion High fall risk.  PT/OT eval pending.  Has difficulty just elevating her left leg off of the bed secondary to pain and very tender over left clavicular area with bruising.  4. Essential hypertension Continue current medications--norvasc, cozaar, lopressor;  Be careful not to overdiurese if she should require any due to her AS.  5. Contusion of clavicle, left, initial encounter Continue percocet for pain and tylenol but not to exceed 3g apap per day. D/C Aleve d/t kidney function.  6. Coronary artery disease involving native coronary artery of native heart with angina pectoris Nitrostrat prn. Cont arb, bb.   7. Hypothyroidism, unspecified hypothyroidism type Recheck TSH. Not on synthroid.  8. Hyponatremia BMET pending. D/C low sodium diet. Change diet to regular with salt allowed--appetite is poor--maybe this will help.  Dietitian to see to determine some supplements for her.  Continues on remeron for appetite, too.     9. Chronic Kidney disease D/C Naproxen, avoid nsaids and other nephrotoxic agents (avoid oxycontin)  10.  Senile osteoporosis:  Cont her calcitonin nasal spray, tylenol for her compression fx pain  11. Left pulmonary nodule:  Suspect she may have some malignancy here considering weight loss, poor po intake, hyponatremia; obtain CTA chest to r/o PE -discussed with her nephew who agrees to CTA  Functional status:  Now dependent except feeding to some degree due to left hip and left clavicle injuries  Family/ staff Communication: spoke with her nephew and HCPOA, Ben and nursing staff  Labs/tests ordered:  CT angio chest w/o contrast to evaluate for PE, TSH; pending cbc, bmp

## 2014-11-30 ENCOUNTER — Non-Acute Institutional Stay: Payer: Medicare Other | Admitting: Adult Health

## 2014-11-30 ENCOUNTER — Ambulatory Visit (HOSPITAL_COMMUNITY): Payer: Medicare Other

## 2014-11-30 DIAGNOSIS — R5381 Other malaise: Secondary | ICD-10-CM | POA: Diagnosis not present

## 2014-11-30 DIAGNOSIS — I350 Nonrheumatic aortic (valve) stenosis: Secondary | ICD-10-CM

## 2014-11-30 DIAGNOSIS — R0902 Hypoxemia: Secondary | ICD-10-CM

## 2014-11-30 DIAGNOSIS — S42002A Fracture of unspecified part of left clavicle, initial encounter for closed fracture: Secondary | ICD-10-CM

## 2014-11-30 LAB — TSH: TSH: 1.57 u[IU]/mL (ref <=5.90)

## 2014-12-01 ENCOUNTER — Encounter: Payer: Self-pay | Admitting: Adult Health

## 2014-12-01 DIAGNOSIS — S42009A Fracture of unspecified part of unspecified clavicle, initial encounter for closed fracture: Secondary | ICD-10-CM | POA: Insufficient documentation

## 2014-12-01 DIAGNOSIS — R5381 Other malaise: Secondary | ICD-10-CM | POA: Insufficient documentation

## 2014-12-01 NOTE — Progress Notes (Signed)
Patient ID: Kendra Tapia, female   DOB: April 01, 1912, 81 y.o.   MRN: [id number]   Location:  [facility]   PCP: Hollace Kinnier, DO  Code Status: DNR  Allergies  Allergen Reactions  . Lipitor [Atorvastatin Calcium]     MUSCLE ACHES  . Micardis [Telmisartan]   . Other     Adhesive tape,chocolate,citrus,berriespaper tape    Chief Complaint  Patient presents with  . Acute Visit    clavicle , CT results    HPI: 81 y.o. female previously residing in assisted living, was transferred to hospital (11/26/14-11/28/14)  due to fall and hypoxia. Now admitted to skilled care memory unit due to increased need for assistance with ADL's.   Workup for hypoxia in hospital revealed questionable etiology. A CT of the chest was performed on 11/29/14 showing to 86mm nodules in the left upper lobe of the lung, a mildly displaced left clavicle fx, negative for PE, and stable thoracic compression fx.   It was noted during her hospitalization on echo that she has severe AS, with a notable murmur. Today she is still oxygen dependent at 2L. The resident states that her [place], would like to be notified of the plan of care regarding her CT scan.  Notable on her scan was a left mildly displaced clavicle fx. She is in pain and has received percocet today. Also, on xray there is a questionable inferior ramus fx on the left.   ROS: Review of Systems  Unable to perform ROS: dementia   Past Medical History  Diagnosis Date  . IHD (ischemic heart disease)   . Nausea   . Coronary artery disease   . Hypothyroidism   . Hyperlipidemia   . Dizziness   . Exudative senile macular degeneration of retina 07/28/2012  . Tear film insufficiency, unspecified 07/28/2012  . Dermatophytosis of foot 05/10/2012  . Unspecified constipation 05/10/2012  . Unspecified urinary incontinence 05/10/2012  . Urethrocele(618.03) 02/11/2012  . Female stress incontinence 02/11/2012  . Depressive disorder,  not elsewhere classified 11/10/2011  . Other malaise and fatigue 11/10/2011  . Loss of weight 11/10/2011  . Macular degeneration (senile) of retina, unspecified 09/24/2011  . Unspecified disorder of kidney and ureter 09/24/2011  . Pathologic fracture of vertebrae 09/24/2011  . Memory loss 09/24/2011  . Abnormality of gait 09/24/2011  . Nocturia 09/24/2011  . Malignant neoplasm of breast (female), unspecified site 08/13/2011  . Hyposmolality and/or hyponatremia 08/13/2011  . Anemia, unspecified 08/13/2011  . Closed fracture of unspecified part of vertebral column without mention of spinal cord injury 08/13/2011  . Personal history of fall 08/13/2011  . Hypertension   . Memory deficit 03/02/2011     2012: HAs STMl, repeats herself, some difficulkty with sequence of events. Functional status, language skills are  intact.   Past Surgical History  Procedure Laterality Date  . Coronary angioplasty  03/23/94    NORMAL LEFT VENTRICULAR SYSTOLIC FUNCTION, EF 61%  . Av fistula repair    . Hip surgery Left 10/07/2010    Dr. Rodell Perna    . Coronary angioplasty with stent placement      RIGHT CORONARY ARTERY  . Mastectomy Left     left    Social History:   reports that she quit smoking about 33 years ago. She has never used smokeless tobacco. She reports that she does not drink alcohol or use illicit drugs.  Family History  Problem Relation Age of Onset  . Heart disease  Mother   . Heart attack Father   . Cancer Sister   . Cancer Brother     Medications: Patient's Medications  New Prescriptions   No medications on file  Previous Medications   ACETAMINOPHEN (TYLENOL) 325 MG TABLET    Take 650 mg by mouth every 8 (eight) hours as needed for moderate pain. Pain   AMLODIPINE (NORVASC) 5 MG TABLET    Take 10 mg by mouth daily. Take one tablet daily for blood pressure   CALCITONIN, SALMON, (MIACALCIN/FORTICAL) 200 UNIT/ACT NASAL SPRAY    Place 1 spray into the nose daily. Alternate nostrils     LOSARTAN (COZAAR) 50 MG TABLET    Take 1 tablet (50 mg total) by mouth daily.   METOPROLOL (LOPRESSOR) 50 MG TABLET    Take 50 mg by mouth 2 (two) times daily.   MIRABEGRON ER (MYRBETRIQ) 50 MG TB24    Take 50 mg by mouth daily.   MIRTAZAPINE (REMERON) 7.5 MG TABLET    Take 7.5 mg by mouth Daily.    MULTIPLE VITAMINS-MINERALS (PRESERVISION AREDS PO)    Take 1 capsule by mouth. Twice a day   OXYCODONE-ACETAMINOPHEN (PERCOCET/ROXICET) 5-325 MG PER TABLET    Take 1 tablet by mouth every 8 (eight) hours as needed for moderate pain.   POLYETHYL GLYCOL-PROPYL GLYCOL 0.4-0.3 % SOLN    Apply 1 drop to eye 4 (four) times daily - after meals and at bedtime.   POLYETHYLENE GLYCOL (MIRALAX / GLYCOLAX) PACKET    Take 17 g by mouth daily.   SENNOSIDES-DOCUSATE SODIUM (SENOKOT-S) 8.6-50 MG TABLET    Take 2 tablets by mouth at bedtime.  Modified Medications   No medications on file  Discontinued Medications   NAPROXEN (NAPROSYN) 250 MG TABLET    Take 1 tablet (250 mg total) by mouth daily with breakfast.     Physical Exam: Filed Vitals:   12/01/14 1131  BP: 112/64  Pulse: 97  Temp: 96.5 F (35.8 C)  Resp: 18  Weight: 96 lb (43.545 kg)  SpO2: 94%   Physical Exam  Constitutional: No distress.  frail  HENT:  Head: Normocephalic.  Eyes: Pupils are equal, round, and reactive to light.  Neck: No JVD present. No tracheal deviation present. No thyromegaly present.  Cardiovascular: Normal rate and regular rhythm.   Murmur heard. Pulmonary/Chest: No respiratory distress.  Bibasilar crackles  Abdominal: Soft. Bowel sounds are normal. She exhibits no distension. There is no tenderness.  Musculoskeletal: She exhibits tenderness. She exhibits no edema.  Weakness, pain and tenderness in left hip area  Lymphadenopathy:    She has no cervical adenopathy.  Skin: Skin is warm and dry. She is not diaphoretic.  Contusions to left clavicle  Psychiatric: She has a normal mood and affect. Her behavior is  normal.    Labs reviewed: Basic Metabolic Panel:  Recent Labs  05/23/14 09/13/14 11/27/14 0235  NA 134* 134* 127*  K 4.5 4.6 4.8  CL  --   --  100  CO2  --   --  20  GLUCOSE  --   --  140*  BUN 21 24* 26*  CREATININE 1.0 1.1 1.07  CALCIUM  --   --  8.5   Liver Function Tests:  Recent Labs  05/23/14 11/27/14 0235  AST 16 29  ALT 10 15  ALKPHOS 60 76  BILITOT  --  0.7  PROT  --  6.3  ALBUMIN  --  3.8   No results for input(s):  LIPASE, AMYLASE in the last 8760 hours. No results for input(s): AMMONIA in the last 8760 hours. CBC:  Recent Labs  05/23/14 09/13/14 11/27/14 0235  WBC 4.8 4.8 15.6*  NEUTROABS  --   --  14.5*  HGB 9.9* 8.8* 9.1*  HCT 29* 27* 27.5*  MCV  --   --  93.9  PLT 209 229 184   Cardiac Enzymes:  Recent Labs  11/27/14 0235  TROPONINI 0.03   BNP: Invalid input(s): POCBNP CBG:  Recent Labs  11/27/14 0721 11/28/14 0701  GLUCAP 125* 113*    Imaging and Procedures Reviewed  CT of chest spiral 11/29/14  No evidence of pulmonary embolism. No acute cardiopulmonary disease.  Mildly displaced fracture of the head of the left clavicle.  The left perihilar density seen on the recent chest radiograph is due to a left pulmonary artery. There are 2 small pulmonary nodules as described with the larger measuring 4 mm over the anterior left upper lobe. Recommend follow-up noncontrast chest CT 1 year. This recommendation follows the consensus statement: Guidelines for Management of Small Pulmonary Nodules Detected on CT Scans: A Statement from the [employer] as published in Radiology 2005; 237:395-400. Online at: https://www.arnold.com/.  Stable severe mid thoracic spine compression fracture.  Atherosclerotic coronary artery disease and moderate cardiomegaly.  11/26/14 pelvic xray reviewed  EXAM: PELVIS - 1-2 VIEW  COMPARISON: None.  FINDINGS: Previous gamma nail treatment of a left femur  fracture. Question subtle evidence of inferior ramus fracture on the left. No other sign of acute pelvic injury.  IMPRESSION: Question inferior ramus fracture on the left.  2D echo 11/26/14 reviewed Study Conclusions  - Left ventricle: Turbulent flow through LV cavity. The cavity size was mildly reduced. Wall thickness was increased in a pattern of mild LVH. Systolic function was vigorous. The estimated ejection fraction was in the range of 65% to 70%. Doppler parameters are consistent with abnormal left ventricular relaxation (grade 1 diastolic dysfunction). - Aortic valve: AV is thickened, calcified with restricted motion. Peak and mean gradients through the valve are 88 and 43 mm Hg respectively consistent with severe AS There was mild regurgitation. Valve area (VTI): 0.76 cm^2. Valve area (Vmax): 0.76 cm^2. - Mitral valve: Calcified annulus. Mildly thickened leaflets . - Right ventricle: The cavity size was mildly dilated. Wall thickness was normal. - Right atrium: The atrium was mildly dilated. - Pulmonary arteries: PA peak pressure: 55 mm Hg (S).  Assessment/Plan  1. Hypoxia Unclear etiology, most likely due to AS and questionable intermittent aspiration due to intermittently elevated WBC and low 02 sats. Will have ST eval resident. The pulmonary nodules are extremely small and most likely not causing this issue. Due to her age we will not perform further screening or intervention.  Continue oxygen at 2L and monitor  2. Fx clavicle, left, closed, initial encounter Provide sling for support. Avoid use of left shoulder for now and continue percocet for pain  3. Aortic stenosis Severe. Continue BB, ARB, and oxygen. There is no medical RX for this issue and she is not a surgical candidate.   4. Debility She has declined functionally. Currently she is not walking and requiring pain meds for hip and left shoulder pain, which makes her sleepy and unable to  participate with PT/OT. She also has a poor appetite despite Remeron admin. I discussed her situation with her POA, Ben. I suggested that we go in a more palliative direction with her care and forego further hospitalizations due to her  age, debility, and severe AS. He was not willing to make this decision and wanted me to discuss it with her. She is not competent to make her own decisions at this time. She is a DNR. We decided to address any hospitalizations as they come up.   5. Poor p.o. Intake Encourage fluids q2hrs WA. Check a BMP on Monday.  If BUN.Cr continue to rise with K would d/c ARB  I spent 45 min on this case, >50% in discussion and counseling with the POA and review of the records   Cindi Carbon, [place] [phone]

## 2014-12-03 ENCOUNTER — Encounter: Payer: Self-pay | Admitting: Internal Medicine

## 2014-12-03 LAB — CULTURE, BLOOD (ROUTINE X 2)
CULTURE: NO GROWTH
Culture: NO GROWTH

## 2014-12-04 LAB — BASIC METABOLIC PANEL
BUN: 28 mg/dL — AB (ref 4–21)
Creatinine: 1.1 mg/dL (ref 0.5–1.1)
GLUCOSE: 107 mg/dL
POTASSIUM: 4.9 mmol/L (ref 3.4–5.3)
Sodium: 135 mmol/L — AB (ref 137–147)

## 2014-12-07 ENCOUNTER — Non-Acute Institutional Stay: Payer: Medicare Other | Admitting: Adult Health

## 2014-12-07 ENCOUNTER — Encounter: Payer: Self-pay | Admitting: Adult Health

## 2014-12-07 DIAGNOSIS — I350 Nonrheumatic aortic (valve) stenosis: Secondary | ICD-10-CM | POA: Diagnosis not present

## 2014-12-07 DIAGNOSIS — S42002A Fracture of unspecified part of left clavicle, initial encounter for closed fracture: Secondary | ICD-10-CM | POA: Diagnosis not present

## 2014-12-07 DIAGNOSIS — R5381 Other malaise: Secondary | ICD-10-CM

## 2014-12-07 DIAGNOSIS — R0902 Hypoxemia: Secondary | ICD-10-CM | POA: Diagnosis not present

## 2014-12-13 ENCOUNTER — Non-Acute Institutional Stay: Payer: Medicare Other | Admitting: Internal Medicine

## 2014-12-13 ENCOUNTER — Encounter: Payer: Self-pay | Admitting: Internal Medicine

## 2014-12-13 DIAGNOSIS — S42002A Fracture of unspecified part of left clavicle, initial encounter for closed fracture: Secondary | ICD-10-CM | POA: Diagnosis not present

## 2014-12-13 DIAGNOSIS — R0902 Hypoxemia: Secondary | ICD-10-CM

## 2014-12-13 DIAGNOSIS — M25552 Pain in left hip: Secondary | ICD-10-CM | POA: Diagnosis not present

## 2014-12-13 NOTE — Progress Notes (Signed)
Patient ID: Kendra Tapia, female   DOB: 1912/09/05, 81 y.o.   MRN: [id number]  Location:  [facility] Provider:  Calla Wedekind L. Mariea Clonts, D.O., C.M.D.  Code Status:  DNR Advanced Directive information Does patient have an advance directive?: Yes, Type of Advance Directive: [place], Does patient want to make changes to advanced directive?: No - Patient declined  Chief Complaint  Patient presents with  . Acute Visit    persistent left hip pain with transfers    HPI:  81 yo white female long term care resident who was moved to [facility] memory care due to functional decline after fall with left hip pain (possible left pubic ramus fx that was not further imaged at hospital before being sent here) and left shoulder pain with hypoxia.  She had been persistently hypoxic and we obtained a CT chest PE protocol which showed lung nodules and a left clavicular fracture.   Staff have noted persistent left hip pain on transfers despite pain medications.  They are questioning if we need to reimage her to determine if they need to transfer her by hoyer.  Review of Systems:  Review of Systems  Constitutional: Negative for fever and chills.  Respiratory: Negative for shortness of breath.   Cardiovascular: Negative for chest pain.  Musculoskeletal: Positive for joint pain.       Left clavicular pain and left hip pain and groin pain, worse with weight bearing and sitting up vs. Supine position  Skin: Negative for rash.    Medications: Patient's Medications  New Prescriptions   No medications on file  Previous Medications   ACETAMINOPHEN (TYLENOL) 325 MG TABLET    Take 650 mg by mouth every 8 (eight) hours as needed for moderate pain. Pain   AMLODIPINE (NORVASC) 5 MG TABLET    Take 10 mg by mouth daily. Take one tablet daily for blood pressure   CALCITONIN, SALMON, (MIACALCIN/FORTICAL) 200 UNIT/ACT NASAL SPRAY    Place 1 spray into the nose daily. Alternate nostrils   LOSARTAN (COZAAR) 50 MG TABLET    Take 1 tablet (50 mg total) by mouth daily.   METOPROLOL (LOPRESSOR) 50 MG TABLET    Take 50 mg by mouth 2 (two) times daily.   MIRABEGRON ER (MYRBETRIQ) 50 MG TB24    Take 50 mg by mouth daily.   MIRTAZAPINE (REMERON) 7.5 MG TABLET    Take 7.5 mg by mouth Daily.    MULTIPLE VITAMINS-MINERALS (PRESERVISION AREDS PO)    Take 1 capsule by mouth. Twice a day   OXYCODONE-ACETAMINOPHEN (PERCOCET/ROXICET) 5-325 MG PER TABLET    Take 1 tablet by mouth every 8 (eight) hours as needed for moderate pain.   POLYETHYL GLYCOL-PROPYL GLYCOL 0.4-0.3 % SOLN    Apply 1 drop to eye 4 (four) times daily - after meals and at bedtime.   POLYETHYLENE GLYCOL (MIRALAX / GLYCOLAX) PACKET    Take 17 g by mouth daily.   SENNOSIDES-DOCUSATE SODIUM (SENOKOT-S) 8.6-50 MG TABLET    Take 2 tablets by mouth at bedtime.  Modified Medications   No medications on file  Discontinued Medications   No medications on file    Physical Exam: Filed Vitals:   12/13/14 1127  BP: 119/65  Pulse: 92  Temp: 98.3 F (36.8 C)  Resp: 18  Height: 4\' 9"  (1.448 m)  Weight: 94 lb 8 oz (42.865 kg)  SpO2: 95%  Physical Exam  Cardiovascular: Normal rate and regular rhythm.   Murmur  heard. 3/6 systolic murmur in aortic area  Pulmonary/Chest: Effort normal and breath sounds normal.  Using oxygen  Abdominal: Soft. Bowel sounds are normal.  Musculoskeletal: She exhibits tenderness.  Left clavicular tenderness, wearing harness type of sling; left groin and hip tenderness; when sitting, she offloads weight from her left side    Labs reviewed: Basic Metabolic Panel:  Recent Labs  11/27/14 0235 11/29/14 12/04/14  NA 127* 136* 135*  K 4.8 5.1 4.9  CL 100  --   --   CO2 20  --   --   GLUCOSE 140*  --   --   BUN 26* 38* 28*  CREATININE 1.07 1.4* 1.1  CALCIUM 8.5  --   --     Liver Function Tests:  Recent Labs  05/23/14 11/27/14 0235  AST 16 29  ALT 10 15  ALKPHOS 60 76  BILITOT  --  0.7    PROT  --  6.3  ALBUMIN  --  3.8    CBC:  Recent Labs  05/23/14 09/13/14 11/27/14 0235  WBC 4.8 4.8 15.6*  NEUTROABS  --   --  14.5*  HGB 9.9* 8.8* 9.1*  HCT 29* 27* 27.5*  MCV  --   --  93.9  PLT 209 229 184    Significant Diagnostic Results:  Possible left pubic ramus fx Left clavicular fx  Assessment/Plan 1. Fx clavicle, left, closed, initial encounter -cont use of supportive sling -cont percocet for pain  2. Hypoxia -cont O2 to keep sats over 90%  3. Left hip pain -Portable xray left hip and pelvis--due to possible pelvic fx seen on xray at hospital that was not further assessed before discharge to [place] -is working with therapy  Family/ staff Communication: discussed with therapy and nursing staff  Goals of care: getting therapy, will likely need long term stay in [facility]  Labs/tests ordered:  Portable xray left hip and pelvis

## 2014-12-14 NOTE — Progress Notes (Signed)
Patient ID: Kendra Tapia, female   DOB: [birth year], 81 y.o.   MRN: [id number]    Location:  [facility]   PCP: Hollace Kinnier, DO  Code Status: DNR  Allergies  Allergen Reactions  . Lipitor [Atorvastatin Calcium]     MUSCLE ACHES  . Micardis [Telmisartan]   . Other     Adhesive tape,chocolate,citrus,berriespaper tape    No chief complaint on file.   HPI: 81 y.o. female previously residing in assisted living, was transferred to hospital (11/26/14-11/28/14)  due to fall and hypoxia. Now admitted to skilled care memory unit due to increased need for assistance with ADL's.   Workup for hypoxia in hospital revealed questionable etiology. A CT of the chest was performed on 11/29/14 showing to 40mm nodules in the left upper lobe of the lung, a mildly displaced left clavicle fx, negative for PE, and stable thoracic compression fx.   It was noted during her hospitalization on echo that she has severe AS, with a notable murmur. Today she is still oxygen dependent at 2L.   In regards to her left clavicle fx, she has continued to have pain and has refused to use her sling. She is not able to use a walker due to this and has apparently not made any gains with PT.   Her diet was changed to mech soft with thin liquids. There have been no reports of coughing with meals.   ROS: Review of Systems  Unable to perform ROS: dementia   Past Medical History  Diagnosis Date  . IHD (ischemic heart disease)   . Nausea   . Coronary artery disease   . Hypothyroidism   . Hyperlipidemia   . Dizziness   . Exudative senile macular degeneration of retina 07/28/2012  . Tear film insufficiency, unspecified 07/28/2012  . Dermatophytosis of foot 05/10/2012  . Unspecified constipation 05/10/2012  . Unspecified urinary incontinence 05/10/2012  . Urethrocele(618.03) 02/11/2012  . Female stress incontinence 02/11/2012  . Depressive disorder, not elsewhere classified 11/10/2011  .  Other malaise and fatigue 11/10/2011  . Loss of weight 11/10/2011  . Macular degeneration (senile) of retina, unspecified 09/24/2011  . Unspecified disorder of kidney and ureter 09/24/2011  . Pathologic fracture of vertebrae 09/24/2011  . Memory loss 09/24/2011  . Abnormality of gait 09/24/2011  . Nocturia 09/24/2011  . Malignant neoplasm of breast (female), unspecified site 08/13/2011  . Hyposmolality and/or hyponatremia 08/13/2011  . Anemia, unspecified 08/13/2011  . Closed fracture of unspecified part of vertebral column without mention of spinal cord injury 08/13/2011  . Personal history of fall 08/13/2011  . Hypertension   . Memory deficit 03/02/2011     2012: HAs STMl, repeats herself, some difficulkty with sequence of events. Functional status, language skills are  intact.   Past Surgical History  Procedure Laterality Date  . Coronary angioplasty  03/23/94    NORMAL LEFT VENTRICULAR SYSTOLIC FUNCTION, EF 19%  . Av fistula repair    . Hip surgery Left 10/07/2010    Dr. Rodell Perna    . Coronary angioplasty with stent placement      RIGHT CORONARY ARTERY  . Mastectomy Left     left    Social History:   reports that she quit smoking about 33 years ago. She has never used smokeless tobacco. She reports that she does not drink alcohol or use illicit drugs.  Family History  Problem Relation Age of Onset  . Heart disease Mother   .  Heart attack Father   . Cancer Sister   . Cancer Brother     Medications: Patient's Medications  New Prescriptions   No medications on file  Previous Medications   ACETAMINOPHEN (TYLENOL) 325 MG TABLET    Take 650 mg by mouth every 8 (eight) hours as needed for moderate pain. Pain   AMLODIPINE (NORVASC) 5 MG TABLET    Take 10 mg by mouth daily. Take one tablet daily for blood pressure   CALCITONIN, SALMON, (MIACALCIN/FORTICAL) 200 UNIT/ACT NASAL SPRAY    Place 1 spray into the nose daily. Alternate nostrils   LOSARTAN (COZAAR) 50 MG TABLET     Take 1 tablet (50 mg total) by mouth daily.   METOPROLOL (LOPRESSOR) 50 MG TABLET    Take 50 mg by mouth 2 (two) times daily.   MIRABEGRON ER (MYRBETRIQ) 50 MG TB24    Take 50 mg by mouth daily.   MIRTAZAPINE (REMERON) 7.5 MG TABLET    Take 7.5 mg by mouth Daily.    MULTIPLE VITAMINS-MINERALS (PRESERVISION AREDS PO)    Take 1 capsule by mouth. Twice a day   OXYCODONE-ACETAMINOPHEN (PERCOCET/ROXICET) 5-325 MG PER TABLET    Take 1 tablet by mouth every 8 (eight) hours as needed for moderate pain.   POLYETHYL GLYCOL-PROPYL GLYCOL 0.4-0.3 % SOLN    Apply 1 drop to eye 4 (four) times daily - after meals and at bedtime.   POLYETHYLENE GLYCOL (MIRALAX / GLYCOLAX) PACKET    Take 17 g by mouth daily.   SENNOSIDES-DOCUSATE SODIUM (SENOKOT-S) 8.6-50 MG TABLET    Take 2 tablets by mouth at bedtime.  Modified Medications   No medications on file  Discontinued Medications   No medications on file     Physical Exam: Filed Vitals:   12/07/14 1411  BP: 118/71  Pulse: 92  Temp: 98.1 F (36.7 C)  Resp: 16  SpO2: 92%   Physical Exam  Constitutional: No distress.  frail  HENT:  Head: Normocephalic.  Eyes: Pupils are equal, round, and reactive to light.  Neck: No JVD present. No tracheal deviation present. No thyromegaly present.  Cardiovascular: Normal rate and regular rhythm.   Murmur heard. Pulmonary/Chest: Effort normal and breath sounds normal. No respiratory distress.  Abdominal: Soft. Bowel sounds are normal. She exhibits no distension. There is no tenderness.  Musculoskeletal: She exhibits tenderness. She exhibits no edema.  Weakness, pain and tenderness in left hip area  Lymphadenopathy:    She has no cervical adenopathy.  Skin: Skin is warm and dry. She is not diaphoretic.  Contusions to left clavicle  Psychiatric: She has a normal mood and affect. Her behavior is normal.    Labs reviewed: Basic Metabolic Panel:  Recent Labs  11/27/14 0235 11/29/14 12/04/14  NA 127* 136* 135*    K 4.8 5.1 4.9  CL 100  --   --   CO2 20  --   --   GLUCOSE 140*  --   --   BUN 26* 38* 28*  CREATININE 1.07 1.4* 1.1  CALCIUM 8.5  --   --    Liver Function Tests:  Recent Labs  05/23/14 11/27/14 0235  AST 16 29  ALT 10 15  ALKPHOS 60 76  BILITOT  --  0.7  PROT  --  6.3  ALBUMIN  --  3.8   No results for input(s): LIPASE, AMYLASE in the last 8760 hours. No results for input(s): AMMONIA in the last 8760 hours. CBC:  Recent Labs  05/23/14 09/13/14 11/27/14 0235  WBC 4.8 4.8 15.6*  NEUTROABS  --   --  14.5*  HGB 9.9* 8.8* 9.1*  HCT 29* 27* 27.5*  MCV  --   --  93.9  PLT 209 229 184   Cardiac Enzymes:  Recent Labs  11/27/14 0235  TROPONINI 0.03   BNP: Invalid input(s): POCBNP CBG:  Recent Labs  11/27/14 0721 11/28/14 0701  GLUCAP 125* 113*    Imaging and Procedures Reviewed  CT of chest spiral 11/29/14  No evidence of pulmonary embolism. No acute cardiopulmonary disease.  Mildly displaced fracture of the head of the left clavicle.  The left perihilar density seen on the recent chest radiograph is due to a left pulmonary artery. There are 2 small pulmonary nodules as described with the larger measuring 4 mm over the anterior left upper lobe. Recommend follow-up noncontrast chest CT 1 year. This recommendation follows the consensus statement: Guidelines for Management of Small Pulmonary Nodules Detected on CT Scans: A Statement from the [employer] as published in Radiology 2005; 237:395-400. Online at: https://www.arnold.com/.  Stable severe mid thoracic spine compression fracture.  Atherosclerotic coronary artery disease and moderate cardiomegaly.  11/26/14 pelvic xray reviewed  EXAM: PELVIS - 1-2 VIEW  COMPARISON: None.  FINDINGS: Previous gamma nail treatment of a left femur fracture. Question subtle evidence of inferior ramus fracture on the left. No other sign of acute pelvic  injury.  IMPRESSION: Question inferior ramus fracture on the left.  2D echo 11/26/14 reviewed Study Conclusions  - Left ventricle: Turbulent flow through LV cavity. The cavity size was mildly reduced. Wall thickness was increased in a pattern of mild LVH. Systolic function was vigorous. The estimated ejection fraction was in the range of 65% to 70%. Doppler parameters are consistent with abnormal left ventricular relaxation (grade 1 diastolic dysfunction). - Aortic valve: AV is thickened, calcified with restricted motion. Peak and mean gradients through the valve are 88 and 43 mm Hg respectively consistent with severe AS There was mild regurgitation. Valve area (VTI): 0.76 cm^2. Valve area (Vmax): 0.76 cm^2. - Mitral valve: Calcified annulus. Mildly thickened leaflets . - Right ventricle: The cavity size was mildly dilated. Wall thickness was normal. - Right atrium: The atrium was mildly dilated. - Pulmonary arteries: PA peak pressure: 55 mm Hg (S).  Assessment/Plan  1. Hypoxia Unclear etiology, most likely due to AS. Still requiring oxygen. Continue oxygen. Also there was concern for aspiration. Continue diet recommended by speech. No reports of coughing with meals.   2. Fx clavicle, left, closed, initial encounter She is not wearing the sling. I have encouraged the staff to tell her to wear it for healing and to reduce pain. Continue percocet for pain.  3. Aortic stenosis Severe. Continue BB, ARB, and oxygen. There is no medical RX for this issue and she is not a surgical candidate.   4. Debility No gains in physical function as of yet. Continue PT.  5. CKD Improved  Cindi Carbon, ANP [facility] [phone]

## 2014-12-14 NOTE — Progress Notes (Signed)
Patient ID: Kendra Tapia, female   DOB: 01-25-1912, 81 y.o.   MRN: [id number]

## 2014-12-19 NOTE — Progress Notes (Signed)
   11/27/14 1400  PT G-Codes **NOT FOR INPATIENT CLASS**  Functional Assessment Tool Used clinical judgement from evaluation assesment  Functional Limitation Mobility: Walking and moving around  Mobility: Walking and Moving Around Current Status (C7670) CM  Mobility: Walking and Moving Around Goal Status (P1003) CJ  entered g code for therapist Kenyon Ana, PT  based upon her assessment documentation.  Clide Dales, PT Pager: [phone] 12/19/2014

## 2014-12-28 LAB — CBC AND DIFFERENTIAL
HCT: 24 % — AB (ref 36–46)
HEMOGLOBIN: 8 g/dL — AB (ref 12.0–16.0)
Platelets: 211 10*3/uL (ref 150–399)
WBC: 4.9 10*3/mL

## 2015-01-02 ENCOUNTER — Non-Acute Institutional Stay: Payer: Medicare Other | Admitting: Adult Health

## 2015-01-02 ENCOUNTER — Encounter: Payer: Self-pay | Admitting: Adult Health

## 2015-01-02 DIAGNOSIS — S42002A Fracture of unspecified part of left clavicle, initial encounter for closed fracture: Secondary | ICD-10-CM | POA: Diagnosis not present

## 2015-01-02 DIAGNOSIS — N183 Chronic kidney disease, stage 3 unspecified: Secondary | ICD-10-CM | POA: Insufficient documentation

## 2015-01-02 DIAGNOSIS — R0902 Hypoxemia: Secondary | ICD-10-CM

## 2015-01-02 DIAGNOSIS — I350 Nonrheumatic aortic (valve) stenosis: Secondary | ICD-10-CM | POA: Diagnosis not present

## 2015-01-02 DIAGNOSIS — R5381 Other malaise: Secondary | ICD-10-CM | POA: Diagnosis not present

## 2015-01-02 DIAGNOSIS — D6489 Other specified anemias: Secondary | ICD-10-CM

## 2015-01-02 DIAGNOSIS — E039 Hypothyroidism, unspecified: Secondary | ICD-10-CM

## 2015-01-02 NOTE — Progress Notes (Signed)
Patient ID: Kendra Tapia, female   DOB: 06-Feb-1912, 81 y.o.   MRN: [id number]    Location:  [facility]    Code Status: DNR  Allergies  Allergen Reactions  . Lipitor [Atorvastatin Calcium]     MUSCLE ACHES  . Micardis [Telmisartan]   . Other     Adhesive tape,chocolate,citrus,berriespaper tape    Chief Complaint  Patient presents with  . Medical Management of Chronic Issues    HPI: 81 y.o. female previously residing in assisted living, was transferred to hospital (11/26/14-11/28/14)  due to fall and hypoxia. Now admitted to skilled care memory unit due to increased need for assistance with ADL's.   Workup for hypoxia in hospital revealed questionable etiology. A CT of the chest was performed on 11/29/14 showing to 79mm nodules in the left upper lobe of the lung, a mildly displaced left clavicle fx, negative for PE, and stable thoracic compression fx.   Update: She has been wearing a brace for her clavicle fx and reports decreased pain. She has not used percocet recently per the [facility]. She is using scheduled Tylenol in the morning.   She is not on oxygen today, sats are in the low 90's without SOB.  She previously was seen on 3/2 for left hip pain. There was a questionable pelvic fx noted during her hospitalization. An xray obtained her on 12/14/14 did not show a fx. Her pain has subsided to the left hip. She is walking with a walker in therapy and making progress.   ROS: Review of Systems  Constitutional: Negative for fever, chills and diaphoresis.  HENT: Negative for congestion.   Respiratory: Negative for cough and shortness of breath.   Cardiovascular: Negative for chest pain and leg swelling.  Gastrointestinal: Negative for heartburn, abdominal pain and constipation.  Genitourinary: Negative for dysuria.  Musculoskeletal: Positive for joint pain.  Neurological: Negative for dizziness, seizures and loss of consciousness.   Past Medical  History  Diagnosis Date  . IHD (ischemic heart disease)   . Nausea   . Coronary artery disease   . Hypothyroidism   . Hyperlipidemia   . Dizziness   . Exudative senile macular degeneration of retina 07/28/2012  . Tear film insufficiency, unspecified 07/28/2012  . Dermatophytosis of foot 05/10/2012  . Unspecified constipation 05/10/2012  . Unspecified urinary incontinence 05/10/2012  . Urethrocele(618.03) 02/11/2012  . Female stress incontinence 02/11/2012  . Depressive disorder, not elsewhere classified 11/10/2011  . Other malaise and fatigue 11/10/2011  . Loss of weight 11/10/2011  . Macular degeneration (senile) of retina, unspecified 09/24/2011  . Unspecified disorder of kidney and ureter 09/24/2011  . Pathologic fracture of vertebrae 09/24/2011  . Memory loss 09/24/2011  . Abnormality of gait 09/24/2011  . Nocturia 09/24/2011  . Malignant neoplasm of breast (female), unspecified site 08/13/2011  . Hyposmolality and/or hyponatremia 08/13/2011  . Anemia, unspecified 08/13/2011  . Closed fracture of unspecified part of vertebral column without mention of spinal cord injury 08/13/2011  . Personal history of fall 08/13/2011  . Hypertension   . Memory deficit 03/02/2011     2012: HAs STMl, repeats herself, some difficulkty with sequence of events. Functional status, language skills are  intact.   Past Surgical History  Procedure Laterality Date  . Coronary angioplasty  03/23/94    NORMAL LEFT VENTRICULAR SYSTOLIC FUNCTION, EF 37%  . Av fistula repair    . Hip surgery Left 10/07/2010    Dr. Rodell Perna    .  Coronary angioplasty with stent placement      RIGHT CORONARY ARTERY  . Mastectomy Left     left    Social History:   reports that she quit smoking about 33 years ago. She has never used smokeless tobacco. She reports that she does not drink alcohol or use illicit drugs.  Family History  Problem Relation Age of Onset  . Heart disease Mother   . Heart attack Father   . Cancer  Sister   . Cancer Brother     Medications: Patient's Medications  New Prescriptions   No medications on file  Previous Medications   ACETAMINOPHEN (TYLENOL) 325 MG TABLET    Take 650 mg by mouth daily. And q6 hrs prn   AMLODIPINE (NORVASC) 5 MG TABLET    Take 10 mg by mouth daily. Take one tablet daily for blood pressure   CALCITONIN, SALMON, (MIACALCIN/FORTICAL) 200 UNIT/ACT NASAL SPRAY    Place 1 spray into the nose daily. Alternate nostrils   LEVOTHYROXINE (SYNTHROID, LEVOTHROID) 25 MCG TABLET    Take 25 mcg by mouth daily before breakfast.   LOSARTAN (COZAAR) 50 MG TABLET    Take 1 tablet (50 mg total) by mouth daily.   METOPROLOL (LOPRESSOR) 50 MG TABLET    Take 50 mg by mouth 2 (two) times daily.   MIRABEGRON ER (MYRBETRIQ) 50 MG TB24    Take 50 mg by mouth daily.   MIRTAZAPINE (REMERON) 7.5 MG TABLET    Take 7.5 mg by mouth Daily.    MULTIPLE VITAMINS-MINERALS (PRESERVISION AREDS PO)    Take 1 capsule by mouth. Twice a day   OXYCODONE-ACETAMINOPHEN (PERCOCET/ROXICET) 5-325 MG PER TABLET    Take 1 tablet by mouth every 8 (eight) hours as needed for moderate pain.   POLYETHYL GLYCOL-PROPYL GLYCOL 0.4-0.3 % SOLN    Apply 1 drop to eye 4 (four) times daily - after meals and at bedtime.   POLYETHYLENE GLYCOL (MIRALAX / GLYCOLAX) PACKET    Take 17 g by mouth daily.   SENNOSIDES-DOCUSATE SODIUM (SENOKOT-S) 8.6-50 MG TABLET    Take 2 tablets by mouth at bedtime.  Modified Medications   No medications on file  Discontinued Medications   No medications on file     Physical Exam: Filed Vitals:   01/02/15 1355  BP: 107/66  Pulse: 76  Temp: 97.9 F (36.6 C)  Resp: 18  SpO2: 93%   Physical Exam  Constitutional: No distress.  frail  HENT:  Head: Normocephalic.  Eyes: Pupils are equal, round, and reactive to light.  Neck: No JVD present. No tracheal deviation present. No thyromegaly present.  Cardiovascular: Normal rate and regular rhythm.   Murmur heard. Pulmonary/Chest:  Effort normal and breath sounds normal. No respiratory distress.  Abdominal: Soft. Bowel sounds are normal. She exhibits no distension. There is no tenderness.  Musculoskeletal: She exhibits tenderness. She exhibits no edema.  Weakness, pain and tenderness in left hip area  Lymphadenopathy:    She has no cervical adenopathy.  Skin: Skin is warm and dry. She is not diaphoretic.  Contusions to left clavicle  Psychiatric: She has a normal mood and affect. Her behavior is normal.    Labs reviewed: Basic Metabolic Panel:  Recent Labs  11/27/14 0235 11/29/14 12/04/14  NA 127* 136* 135*  K 4.8 5.1 4.9  CL 100  --   --   CO2 20  --   --   GLUCOSE 140*  --   --   BUN 26*  38* 28*  CREATININE 1.07 1.4* 1.1  CALCIUM 8.5  --   --    Liver Function Tests:  Recent Labs  05/23/14 11/27/14 0235  AST 16 29  ALT 10 15  ALKPHOS 60 76  BILITOT  --  0.7  PROT  --  6.3  ALBUMIN  --  3.8   No results for input(s): LIPASE, AMYLASE in the last 8760 hours. No results for input(s): AMMONIA in the last 8760 hours. CBC:  Recent Labs  09/13/14 11/27/14 0235 12/28/14  WBC 4.8 15.6* 4.9  NEUTROABS  --  14.5*  --   HGB 8.8* 9.1* 8.0*  HCT 27* 27.5* 24*  MCV  --  93.9  --   PLT 229 184 211   Cardiac Enzymes:  Recent Labs  11/27/14 0235  TROPONINI 0.03   BNP: Invalid input(s): POCBNP CBG:  Recent Labs  11/27/14 0721 11/28/14 0701  GLUCAP 125* 113*   11/30/14 TSH 1.574 12/28/14 Iron 30, TIBC 276, %sat 11  Imaging and Procedures Reviewed  CT of chest spiral 11/29/14  No evidence of pulmonary embolism. No acute cardiopulmonary disease.  Mildly displaced fracture of the head of the left clavicle.  The left perihilar density seen on the recent chest radiograph is due to a left pulmonary artery. There are 2 small pulmonary nodules as described with the larger measuring 4 mm over the anterior left upper lobe. Recommend follow-up noncontrast chest CT 1 year. This recommendation  follows the consensus statement: Guidelines for Management of Small Pulmonary Nodules Detected on CT Scans: A Statement from the [employer] as published in Radiology 2005; 237:395-400. Online at: https://www.arnold.com/.  Stable severe mid thoracic spine compression fracture.  Atherosclerotic coronary artery disease and moderate cardiomegaly.  11/26/14 pelvic xray reviewed  EXAM: PELVIS - 1-2 VIEW  COMPARISON: None.  FINDINGS: Previous gamma nail treatment of a left femur fracture. Question subtle evidence of inferior ramus fracture on the left. No other sign of acute pelvic injury.  IMPRESSION: Question inferior ramus fracture on the left.  2D echo 11/26/14 reviewed Study Conclusions  - Left ventricle: Turbulent flow through LV cavity. The cavity size was mildly reduced. Wall thickness was increased in a pattern of mild LVH. Systolic function was vigorous. The estimated ejection fraction was in the range of 65% to 70%. Doppler parameters are consistent with abnormal left ventricular relaxation (grade 1 diastolic dysfunction). - Aortic valve: AV is thickened, calcified with restricted motion. Peak and mean gradients through the valve are 88 and 43 mm Hg respectively consistent with severe AS There was mild regurgitation. Valve area (VTI): 0.76 cm^2. Valve area (Vmax): 0.76 cm^2. - Mitral valve: Calcified annulus. Mildly thickened leaflets . - Right ventricle: The cavity size was mildly dilated. Wall thickness was normal. - Right atrium: The atrium was mildly dilated. - Pulmonary arteries: PA peak pressure: 55 mm Hg (S).  Assessment/Plan  1. Hypoxia Improved. May titrate oxygen to maintain sat>90% with no perceived dyspnea or CP.    2. Fx clavicle, left, closed, initial encounter F/U xray ordered. Continue scheduled Tylenol  3. Aortic stenosis Severe. Continue BB, ARB. There is no medical RX for this  issue and she is not a surgical candidate.   4. Debility Improved functionality. Continue PT.  5. ACD Stable. CBC due next week.   6. Hypothyroidism Stable, continue current dose of Synthroid  7. CKD Stage 3 Improved     Cindi Carbon, ANP [facility] [phone]

## 2015-01-11 LAB — CBC AND DIFFERENTIAL
HCT: 27 % — AB (ref 36–46)
HEMOGLOBIN: 8.8 g/dL — AB (ref 12.0–16.0)
Platelets: 241 10*3/uL (ref 150–399)
WBC: 5.1 10^3/mL

## 2015-01-31 ENCOUNTER — Encounter: Payer: Self-pay | Admitting: Nurse Practitioner

## 2015-03-05 ENCOUNTER — Ambulatory Visit (INDEPENDENT_AMBULATORY_CARE_PROVIDER_SITE_OTHER): Payer: Medicare Other | Admitting: Cardiology

## 2015-03-05 ENCOUNTER — Encounter: Payer: Self-pay | Admitting: Cardiology

## 2015-03-05 VITALS — BP 108/56 | HR 64 | Ht <= 58 in | Wt 104.6 lb

## 2015-03-05 DIAGNOSIS — I2510 Atherosclerotic heart disease of native coronary artery without angina pectoris: Secondary | ICD-10-CM

## 2015-03-05 DIAGNOSIS — I350 Nonrheumatic aortic (valve) stenosis: Secondary | ICD-10-CM | POA: Diagnosis not present

## 2015-03-05 DIAGNOSIS — I119 Hypertensive heart disease without heart failure: Secondary | ICD-10-CM

## 2015-03-05 DIAGNOSIS — I2583 Coronary atherosclerosis due to lipid rich plaque: Secondary | ICD-10-CM

## 2015-03-05 NOTE — Patient Instructions (Signed)
Medication Instructions:  Your physician recommends that you continue on your current medications as directed. Please refer to the Current Medication list given to you today.  Labwork: non3  Testing/Procedures: none  Follow-Up: Your physician wants you to follow-up in: 6 month ov You will receive a reminder letter in the mail two months in advance. If you don't receive a letter, please call our office to schedule the follow-up appointment.

## 2015-03-05 NOTE — Progress Notes (Signed)
Cardiology Office Note   Date:  03/05/2015   ID:  Kendra Tapia, DOB 17-Aug-1912, MRN [id number]  PCP:  Hollace Kinnier, DO  Cardiologist: Darlin Coco MD  No chief complaint on file.     History of Present Illness: Kendra Tapia is a 81 y.o. female who presents for a six-month follow-up office visit  This pleasant 81 year old woman is seen for a six-month followup office visit. She has a history of ischemic heart disease and a history of aortic stenosis and a history of essential hypertension. She also has hypothyroidism and hypercholesterolemia. Now lives in assisted living at [place]. She no longer drives her car. She has not been experiencing any prolonged chest discomfort. She has occasional brief chest pain which does not require sublingual nitroglycerin. She has a known murmur of mild aortic stenosis. Since last visit she has been doing well with no new cardiac complaints. Since we last saw her she celebrated her 89th birthday party in October. She has been feeling well. She had had some problems with elevated blood pressure which has responded to the addition of amlodipine 5 mg daily.  She has had some declining memory and is now in the memory unit. She denies any chest pain.  She has not had to take any recent nitroglycerin.  Since we saw her she fell and broke her collar bone.  It affected her swallowing temporarily but she is now eating well again.  Past Medical History  Diagnosis Date  . IHD (ischemic heart disease)   . Nausea   . Coronary artery disease   . Hypothyroidism   . Hyperlipidemia   . Dizziness   . Exudative senile macular degeneration of retina 07/28/2012  . Tear film insufficiency, unspecified 07/28/2012  . Dermatophytosis of foot 05/10/2012  . Unspecified constipation 05/10/2012  . Unspecified urinary incontinence 05/10/2012  . Urethrocele(618.03) 02/11/2012  . Female stress incontinence 02/11/2012  . Depressive disorder, not elsewhere classified  11/10/2011  . Other malaise and fatigue 11/10/2011  . Loss of weight 11/10/2011  . Macular degeneration (senile) of retina, unspecified 09/24/2011  . Unspecified disorder of kidney and ureter 09/24/2011  . Pathologic fracture of vertebrae 09/24/2011  . Memory loss 09/24/2011  . Abnormality of gait 09/24/2011  . Nocturia 09/24/2011  . Malignant neoplasm of breast (female), unspecified site 08/13/2011  . Hyposmolality and/or hyponatremia 08/13/2011  . Anemia, unspecified 08/13/2011  . Closed fracture of unspecified part of vertebral column without mention of spinal cord injury 08/13/2011  . Personal history of fall 08/13/2011  . Hypertension   . Memory deficit 03/02/2011     2012: HAs STMl, repeats herself, some difficulkty with sequence of events. Functional status, language skills are  intact.    Past Surgical History  Procedure Laterality Date  . Coronary angioplasty  03/23/94    NORMAL LEFT VENTRICULAR SYSTOLIC FUNCTION, EF 56%  . Av fistula repair    . Hip surgery Left 10/07/2010    Dr. Rodell Perna    . Coronary angioplasty with stent placement      RIGHT CORONARY ARTERY  . Mastectomy Left     left      Current Outpatient Prescriptions  Medication Sig Dispense Refill  . acetaminophen (TYLENOL) 325 MG tablet Take 650 mg by mouth daily. And q6 hrs prn    . amLODipine (NORVASC) 10 MG tablet Take 10 mg by mouth daily.    . calcitonin, salmon, (MIACALCIN/FORTICAL) 200 UNIT/ACT nasal spray Place 1 spray into the nose daily.  Alternate nostrils    . cloNIDine (CATAPRES) 0.1 MG tablet Take 0.1 mg by mouth daily as needed. Take 0.1mg  by mouth as need for systolic blood [id number]    . hydroxypropyl methylcellulose / hypromellose (ISOPTO TEARS / GONIOVISC) 2.5 % ophthalmic solution Place 1 drop into both eyes every 4 (four) hours as needed for dry eyes (dry eye).    Marland Kitchen lactose free nutrition (BOOST PLUS) LIQD Take 237 mLs by mouth 2 (two) times daily between meals.    Marland Kitchen levothyroxine  (SYNTHROID, LEVOTHROID) 25 MCG tablet Take 25 mcg by mouth daily before breakfast.    . losartan (COZAAR) 50 MG tablet Take 1 tablet (50 mg total) by mouth daily. 30 tablet 6  . metoprolol (LOPRESSOR) 50 MG tablet Take 50 mg by mouth 2 (two) times daily.    . mirabegron ER (MYRBETRIQ) 50 MG TB24 Take 50 mg by mouth daily.    . mirtazapine (REMERON) 15 MG tablet Take 7.5 mg by mouth daily.    . Multiple Vitamins-Minerals (PRESERVISION AREDS PO) Take 1 capsule by mouth. Twice a day    . NITROSTAT 0.4 MG SL tablet Place 0.4 mg under the tongue as needed. Place 1 tablet under the tongue every 5 minutes as needed for chest pain up to 3 doses    . oxyCODONE-acetaminophen (PERCOCET/ROXICET) 5-325 MG per tablet Take 1 tablet by mouth every 8 (eight) hours as needed for moderate pain. 30 tablet 0  . Polyethyl Glycol-Propyl Glycol 0.4-0.3 % SOLN Apply 1 drop to eye 4 (four) times daily - after meals and at bedtime.    . polyethylene glycol (MIRALAX / GLYCOLAX) packet Take 17 g by mouth daily.    . sennosides-docusate sodium (SENOKOT-S) 8.6-50 MG tablet Take 2 tablets by mouth at bedtime.    Marland Kitchen tobramycin (TOBREX) 0.3 % ophthalmic solution Place 1 drop into the right eye every 6 (six) hours. Place 1 drop into the right eye every 6 hours 1 day prior to appoint     No current facility-administered medications for this visit.    Allergies:   Lipitor; Micardis; and Other    Social History:  The patient  reports that she quit smoking about 34 years ago. She has never used smokeless tobacco. She reports that she does not drink alcohol or use illicit drugs.   Family History:  The patient's family history includes Cancer in her brother and sister; Heart attack in her father; Heart disease in her mother.    ROS:  Please see the history of present illness.   Otherwise, review of systems are positive for none.   All other systems are reviewed and negative.    PHYSICAL EXAM: VS:  BP 108/56 mmHg  Pulse 64  Ht  4\' 9"  (1.448 m)  Wt 104 lb 9.6 oz (47.446 kg)  BMI 22.63 kg/m2 , BMI Body mass index is 22.63 kg/(m^2). GEN: Well nourished, well developed, in no acute distress HEENT: normal Neck: no JVD, carotid bruits, or masses Cardiac: RRR; grade 2/6 systolic ejection murmur at base. Respiratory:  clear to auscultation bilaterally, normal work of breathing GI: soft, nontender, nondistended, + BS MS: no deformity or atrophy Skin: warm and dry, no rash Neuro:  Strength and sensation are intact Psych: euthymic mood, full affect   EKG:  EKG is not ordered today.    Recent Labs: 11/27/2014: ALT 15; B Natriuretic Peptide 271.5* 11/30/2014: TSH 1.57 12/04/2014: BUN 28*; Creatinine 1.1; Potassium 4.9; Sodium 135* 12/28/2014: Hemoglobin 8.0*; Platelets 211  Lipid Panel    Component Value Date/Time   CHOL 174 03/13/2011 1017   TRIG 67.0 03/13/2011 1017   HDL 65.50 03/13/2011 1017   CHOLHDL 3 03/13/2011 1017   VLDL 13.4 03/13/2011 1017   LDLCALC 95 03/13/2011 1017      Wt Readings from Last 3 Encounters:  03/05/15 104 lb 9.6 oz (47.446 kg)  12/13/14 94 lb 8 oz (42.865 kg)  12/01/14 96 lb (43.545 kg)         ASSESSMENT AND PLAN:  1. Hypertensive heart disease without heart failure 2. Ischemic heart disease status post remote PCI in [facility] many years ago. Rare angina pectoris. 3. Aortic stenosis , mild to moderate 4. Hypothyroidism , on medication, clinically euthyroid 5.  Age-related memory decrease   Current medicines are reviewed at length with the patient today.  The patient does not have concerns regarding medicines.  The following changes have been made:  no change  Labs/ tests ordered today include:  No orders of the defined types were placed in this encounter.    Plan: The patient is to continue current medication.  Recheck in 6 months.   Berna Spare MD 03/05/2015 5:35 PM    [facility] [place],  [place], [place]  [address] Phone: [phone]; Fax: [phone]

## 2015-03-23 ENCOUNTER — Non-Acute Institutional Stay (SKILLED_NURSING_FACILITY): Payer: Medicare Other | Admitting: Adult Health

## 2015-03-23 ENCOUNTER — Encounter: Payer: Self-pay | Admitting: Adult Health

## 2015-03-23 DIAGNOSIS — I350 Nonrheumatic aortic (valve) stenosis: Secondary | ICD-10-CM | POA: Diagnosis not present

## 2015-03-23 DIAGNOSIS — D6489 Other specified anemias: Secondary | ICD-10-CM

## 2015-03-23 DIAGNOSIS — F0280 Dementia in other diseases classified elsewhere without behavioral disturbance: Secondary | ICD-10-CM

## 2015-03-23 DIAGNOSIS — R634 Abnormal weight loss: Secondary | ICD-10-CM | POA: Diagnosis not present

## 2015-03-23 DIAGNOSIS — I10 Essential (primary) hypertension: Secondary | ICD-10-CM | POA: Diagnosis not present

## 2015-03-23 DIAGNOSIS — E039 Hypothyroidism, unspecified: Secondary | ICD-10-CM

## 2015-03-23 DIAGNOSIS — G309 Alzheimer's disease, unspecified: Secondary | ICD-10-CM

## 2015-03-23 NOTE — Progress Notes (Signed)
Patient ID: Kendra Tapia, female   DOB: 1911-10-22, 81 y.o.   MRN: [id number]    Location:  [facility]    Code Status: DNR  Allergies  Allergen Reactions  . Lipitor [Atorvastatin Calcium]     MUSCLE ACHES  . Micardis [Telmisartan]   . Other     Adhesive tape,chocolate,citrus,berriespaper tape    Chief Complaint  Patient presents with  . Medical Management of Chronic Issues    HPI: 81 y.o. female currently residing in memory care. I am here to review her chronic diseases. She has no complaints today for the visit. She has a hx of IHD, AS, FTT, hypothyroidism, HLD, anemia, HTN, and falls. She is currently ambulatory with a walker and off oxygen. She has been on Remeron and her weight has trended upward to 106 lbs, a 12 lb gain over the past 3 months. She has issues with uncontrolled htn in the past but has not used any prn clonidine in the past two weeks per the [facility].    ROS: Review of Systems  Constitutional: Negative for fever, chills and diaphoresis.  HENT: Negative for congestion.   Respiratory: Negative for cough and shortness of breath.   Cardiovascular: Negative for chest pain and leg swelling.  Gastrointestinal: Negative for heartburn, abdominal pain and constipation.  Genitourinary: Negative for dysuria.  Musculoskeletal: Positive for joint pain.  Neurological: Negative for dizziness, seizures and loss of consciousness.   Past Medical History  Diagnosis Date  . IHD (ischemic heart disease)   . Nausea   . Coronary artery disease   . Hypothyroidism   . Hyperlipidemia   . Dizziness   . Exudative senile macular degeneration of retina 07/28/2012  . Tear film insufficiency, unspecified 07/28/2012  . Dermatophytosis of foot 05/10/2012  . Unspecified constipation 05/10/2012  . Unspecified urinary incontinence 05/10/2012  . Urethrocele(618.03) 02/11/2012  . Female stress incontinence 02/11/2012  . Depressive disorder, not elsewhere  classified 11/10/2011  . Other malaise and fatigue 11/10/2011  . Loss of weight 11/10/2011  . Macular degeneration (senile) of retina, unspecified 09/24/2011  . Unspecified disorder of kidney and ureter 09/24/2011  . Pathologic fracture of vertebrae 09/24/2011  . Memory loss 09/24/2011  . Abnormality of gait 09/24/2011  . Nocturia 09/24/2011  . Malignant neoplasm of breast (female), unspecified site 08/13/2011  . Hyposmolality and/or hyponatremia 08/13/2011  . Anemia, unspecified 08/13/2011  . Closed fracture of unspecified part of vertebral column without mention of spinal cord injury 08/13/2011  . Personal history of fall 08/13/2011  . Hypertension   . Memory deficit 03/02/2011     2012: HAs STMl, repeats herself, some difficulkty with sequence of events. Functional status, language skills are  intact.   Past Surgical History  Procedure Laterality Date  . Coronary angioplasty  03/23/94    NORMAL LEFT VENTRICULAR SYSTOLIC FUNCTION, EF 09%  . Av fistula repair    . Hip surgery Left 10/07/2010    Dr. Rodell Perna    . Coronary angioplasty with stent placement      RIGHT CORONARY ARTERY  . Mastectomy Left     left    Social History:   reports that she quit smoking about 34 years ago. She has never used smokeless tobacco. She reports that she does not drink alcohol or use illicit drugs.  Family History  Problem Relation Age of Onset  . Heart disease Mother   . Heart attack Father   . Cancer Sister   .  Cancer Brother     Medications: Patient's Medications  New Prescriptions   No medications on file  Previous Medications   ACETAMINOPHEN (TYLENOL) 325 MG TABLET    Take 650 mg by mouth daily. And q6 hrs prn   AMLODIPINE (NORVASC) 10 MG TABLET    Take 10 mg by mouth daily.   CALCITONIN, SALMON, (MIACALCIN/FORTICAL) 200 UNIT/ACT NASAL SPRAY    Place 1 spray into the nose daily. Alternate nostrils   CLONIDINE (CATAPRES) 0.1 MG TABLET    Take 0.1 mg by mouth daily as needed. Take 0.1mg   by mouth as need for systolic blood [id number]   HYDROXYPROPYL METHYLCELLULOSE / HYPROMELLOSE (ISOPTO TEARS / GONIOVISC) 2.5 % OPHTHALMIC SOLUTION    Place 1 drop into both eyes every 4 (four) hours as needed for dry eyes (dry eye).   LACTOSE FREE NUTRITION (BOOST PLUS) LIQD    Take 237 mLs by mouth 2 (two) times daily between meals.   LEVOTHYROXINE (SYNTHROID, LEVOTHROID) 25 MCG TABLET    Take 25 mcg by mouth daily before breakfast.   LOSARTAN (COZAAR) 50 MG TABLET    Take 1 tablet (50 mg total) by mouth daily.   METOPROLOL (LOPRESSOR) 50 MG TABLET    Take 50 mg by mouth 2 (two) times daily.   MIRABEGRON ER (MYRBETRIQ) 50 MG TB24    Take 50 mg by mouth daily.   MIRTAZAPINE (REMERON) 15 MG TABLET    Take 7.5 mg by mouth daily.   MULTIPLE VITAMINS-MINERALS (PRESERVISION AREDS PO)    Take 1 capsule by mouth. Twice a day   NITROSTAT 0.4 MG SL TABLET    Place 0.4 mg under the tongue as needed. Place 1 tablet under the tongue every 5 minutes as needed for chest pain up to 3 doses   OXYCODONE-ACETAMINOPHEN (PERCOCET/ROXICET) 5-325 MG PER TABLET    Take 1 tablet by mouth every 8 (eight) hours as needed for moderate pain.   POLYETHYL GLYCOL-PROPYL GLYCOL 0.4-0.3 % SOLN    Apply 1 drop to eye 4 (four) times daily - after meals and at bedtime.   POLYETHYLENE GLYCOL (MIRALAX / GLYCOLAX) PACKET    Take 17 g by mouth daily.   SENNOSIDES-DOCUSATE SODIUM (SENOKOT-S) 8.6-50 MG TABLET    Take 2 tablets by mouth at bedtime.   TOBRAMYCIN (TOBREX) 0.3 % OPHTHALMIC SOLUTION    Place 1 drop into the right eye every 6 (six) hours. Place 1 drop into the right eye every 6 hours 1 day prior to appoint  Modified Medications   No medications on file  Discontinued Medications   No medications on file     Physical Exam: Filed Vitals:   03/23/15 1203  BP: 127/70  Pulse: 72  Temp: 96.8 F (36 C)  Resp: 18  Weight: 106 lb (48.081 kg)  SpO2: 93%   Wt Readings from Last 3 Encounters:  03/23/15 106 lb (48.081 kg)    03/05/15 104 lb 9.6 oz (47.446 kg)  12/13/14 94 lb 8 oz (42.865 kg)    Physical Exam  Constitutional: No distress.  frail  HENT:  Head: Normocephalic.  Eyes: Pupils are equal, round, and reactive to light.  Neck: No JVD present. No tracheal deviation present. No thyromegaly present.  Cardiovascular: Normal rate and regular rhythm.   Murmur heard. Pulmonary/Chest: Effort normal and breath sounds normal. No respiratory distress.  Abdominal: Soft. Bowel sounds are normal. She exhibits no distension. There is no tenderness.  Musculoskeletal: She exhibits no edema.  Lymphadenopathy:  She has no cervical adenopathy.  Skin: Skin is warm and dry. She is not diaphoretic.  Psychiatric: She has a normal mood and affect. Her behavior is normal.    Labs reviewed: Basic Metabolic Panel:  Recent Labs  11/27/14 0235 11/29/14 12/04/14  NA 127* 136* 135*  K 4.8 5.1 4.9  CL 100  --   --   CO2 20  --   --   GLUCOSE 140*  --   --   BUN 26* 38* 28*  CREATININE 1.07 1.4* 1.1  CALCIUM 8.5  --   --    Liver Function Tests:  Recent Labs  05/23/14 11/27/14 0235  AST 16 29  ALT 10 15  ALKPHOS 60 76  BILITOT  --  0.7  PROT  --  6.3  ALBUMIN  --  3.8   No results for input(s): LIPASE, AMYLASE in the last 8760 hours. No results for input(s): AMMONIA in the last 8760 hours. CBC:  Recent Labs  11/27/14 0235 12/28/14 01/11/15  WBC 15.6* 4.9 5.1  NEUTROABS 14.5*  --   --   HGB 9.1* 8.0* 8.8*  HCT 27.5* 24* 27*  MCV 93.9  --   --   PLT 184 211 241   Cardiac Enzymes:  Recent Labs  11/27/14 0235  TROPONINI 0.03   BNP: Invalid input(s): POCBNP CBG:  Recent Labs  11/27/14 0721 11/28/14 0701  GLUCAP 125* 113*   11/30/14 TSH 1.574 12/28/14 Iron 30, TIBC 276, %sat 11  Imaging and Procedures Reviewed  CT of chest spiral 11/29/14  No evidence of pulmonary embolism. No acute cardiopulmonary disease.  Mildly displaced fracture of the head of the left clavicle.  The left  perihilar density seen on the recent chest radiograph is due to a left pulmonary artery. There are 2 small pulmonary nodules as described with the larger measuring 4 mm over the anterior left upper lobe. Recommend follow-up noncontrast chest CT 1 year. This recommendation follows the consensus statement: Guidelines for Management of Small Pulmonary Nodules Detected on CT Scans: A Statement from the [place] as published in Radiology 2005; 237:395-400. Online at: https://www.arnold.com/.  Stable severe mid thoracic spine compression fracture.  Atherosclerotic coronary artery disease and moderate cardiomegaly.  11/26/14 pelvic xray reviewed  EXAM: PELVIS - 1-2 VIEW  COMPARISON: None.  FINDINGS: Previous gamma nail treatment of a left femur fracture. Question subtle evidence of inferior ramus fracture on the left. No other sign of acute pelvic injury.  IMPRESSION: Question inferior ramus fracture on the left.  2D echo 11/26/14 reviewed Study Conclusions  - Left ventricle: Turbulent flow through LV cavity. The cavity size was mildly reduced. Wall thickness was increased in a pattern of mild LVH. Systolic function was vigorous. The estimated ejection fraction was in the range of 65% to 70%. Doppler parameters are consistent with abnormal left ventricular relaxation (grade 1 diastolic dysfunction). - Aortic valve: AV is thickened, calcified with restricted motion. Peak and mean gradients through the valve are 88 and 43 mm Hg respectively consistent with severe AS There was mild regurgitation. Valve area (VTI): 0.76 cm^2. Valve area (Vmax): 0.76 cm^2. - Mitral valve: Calcified annulus. Mildly thickened leaflets . - Right ventricle: The cavity size was mildly dilated. Wall thickness was normal. - Right atrium: The atrium was mildly dilated. - Pulmonary arteries: PA peak pressure: 55 mm Hg  (S).  Assessment/Plan  1. Weight loss, unintentional Improved. Continue Remeron and monitor weights  2. Hypothyroidism, unspecified hypothyroidism type Check TSH  3. Essential hypertension Stable, continue lopressor, norvasc, and cozaar. Check BMP  4. Aortic stenosis Continue BP control and monitoring weight and fluid status. No intervention due to age.   5. Anemia due to other cause Check CBC  6. Alzheimer's disease Stable function. Not on meds due to age, weight, and debility. Continue supportive care.     Cindi Carbon, ANP [facility] [phone]

## 2015-03-27 LAB — CBC AND DIFFERENTIAL
HCT: 32 % — AB (ref 36–46)
Hemoglobin: 10.9 g/dL — AB (ref 12.0–16.0)
Platelets: 227 10*3/uL (ref 150–399)
WBC: 5.9 10*3/mL

## 2015-03-27 LAB — BASIC METABOLIC PANEL
BUN: 33 mg/dL — AB (ref 4–21)
CREATININE: 1.1 mg/dL (ref 0.5–1.1)
GLUCOSE: 85 mg/dL
POTASSIUM: 4.6 mmol/L (ref 3.4–5.3)
Sodium: 133 mmol/L — AB (ref 137–147)

## 2015-03-27 LAB — HEPATIC FUNCTION PANEL
ALT: 11 U/L (ref 7–35)
AST: 15 U/L (ref 13–35)
Alkaline Phosphatase: 71 U/L (ref 25–125)
Bilirubin, Total: 0.5 mg/dL

## 2015-03-27 LAB — TSH: TSH: 3.84 u[IU]/mL (ref 0.41–5.90)

## 2015-03-29 DIAGNOSIS — F0280 Dementia in other diseases classified elsewhere without behavioral disturbance: Secondary | ICD-10-CM | POA: Insufficient documentation

## 2015-03-29 DIAGNOSIS — G309 Alzheimer's disease, unspecified: Secondary | ICD-10-CM

## 2015-05-01 ENCOUNTER — Other Ambulatory Visit: Payer: Self-pay

## 2015-05-28 ENCOUNTER — Non-Acute Institutional Stay: Payer: Medicare Other | Admitting: Adult Health

## 2015-05-28 DIAGNOSIS — G309 Alzheimer's disease, unspecified: Secondary | ICD-10-CM

## 2015-05-28 DIAGNOSIS — E039 Hypothyroidism, unspecified: Secondary | ICD-10-CM | POA: Diagnosis not present

## 2015-05-28 DIAGNOSIS — K5901 Slow transit constipation: Secondary | ICD-10-CM

## 2015-05-28 DIAGNOSIS — N183 Chronic kidney disease, stage 3 unspecified: Secondary | ICD-10-CM

## 2015-05-28 DIAGNOSIS — I10 Essential (primary) hypertension: Secondary | ICD-10-CM | POA: Diagnosis not present

## 2015-05-28 DIAGNOSIS — I350 Nonrheumatic aortic (valve) stenosis: Secondary | ICD-10-CM

## 2015-05-28 DIAGNOSIS — R634 Abnormal weight loss: Secondary | ICD-10-CM

## 2015-05-28 DIAGNOSIS — D6489 Other specified anemias: Secondary | ICD-10-CM

## 2015-05-28 DIAGNOSIS — F0280 Dementia in other diseases classified elsewhere without behavioral disturbance: Secondary | ICD-10-CM

## 2015-05-31 ENCOUNTER — Encounter: Payer: Self-pay | Admitting: Adult Health

## 2015-05-31 DIAGNOSIS — K5900 Constipation, unspecified: Secondary | ICD-10-CM | POA: Insufficient documentation

## 2015-05-31 NOTE — Progress Notes (Signed)
Patient ID: Kendra Tapia, female   DOB: May 23, 1912, 81 y.o.   MRN: [id number]    Location:  [facility] [place]    Code Status: DNR  Allergies  Allergen Reactions  . Lipitor [Atorvastatin Calcium]     MUSCLE ACHES  . Micardis [Telmisartan]   . Other     Adhesive tape,chocolate,citrus,berriespaper tape    Chief Complaint  Patient presents with  . Medical Management of Chronic Issues    HPI: 81 y.o. female currently residing in memory care. I am here to review her chronic diseases. She has no complaints today for the visit. She has a hx of IHD, AS, FTT, hypothyroidism, HLD, anemia, HTN, dementia,  and falls. She has been on Remeron and her weight has trended upward to 108 lbs.  She spends most of the day in the chair holding a baby, which she thinks is a friends baby.  She continues to go to the ophthmalogist for eye injections due to MD, most recently on 7/19. She has had an issue with HTN in the past, but her most recently numbers range 108-132/69-83.    Of note there were two small left pulmonary nodules noted on a CT scan in February, there will be no further follow up in this issue as she is 89 and has no symptoms.   ROS: Review of Systems  Constitutional: Negative for fever, chills and diaphoresis.  HENT: Negative for congestion.   Respiratory: Negative for cough and shortness of breath.   Cardiovascular: Negative for chest pain and leg swelling.  Gastrointestinal: Negative for heartburn, abdominal pain and constipation.  Genitourinary: Negative for dysuria.  Musculoskeletal: Positive for joint pain.  Neurological: Negative for dizziness, seizures and loss of consciousness.   Past Medical History  Diagnosis Date  . IHD (ischemic heart disease)   . Nausea   . Coronary artery disease   . Hypothyroidism   . Hyperlipidemia   . Dizziness   . Exudative senile macular degeneration of retina 07/28/2012  . Tear film insufficiency,  unspecified 07/28/2012  . Dermatophytosis of foot 05/10/2012  . Unspecified constipation 05/10/2012  . Unspecified urinary incontinence 05/10/2012  . Urethrocele(618.03) 02/11/2012  . Female stress incontinence 02/11/2012  . Depressive disorder, not elsewhere classified 11/10/2011  . Other malaise and fatigue 11/10/2011  . Loss of weight 11/10/2011  . Macular degeneration (senile) of retina, unspecified 09/24/2011  . Unspecified disorder of kidney and ureter 09/24/2011  . Pathologic fracture of vertebrae 09/24/2011  . Memory loss 09/24/2011  . Abnormality of gait 09/24/2011  . Nocturia 09/24/2011  . Malignant neoplasm of breast (female), unspecified site 08/13/2011  . Hyposmolality and/or hyponatremia 08/13/2011  . Anemia, unspecified 08/13/2011  . Closed fracture of unspecified part of vertebral column without mention of spinal cord injury 08/13/2011  . Personal history of fall 08/13/2011  . Hypertension   . Memory deficit 03/02/2011     2012: HAs STMl, repeats herself, some difficulkty with sequence of events. Functional status, language skills are  intact.   Past Surgical History  Procedure Laterality Date  . Coronary angioplasty  03/23/94    NORMAL LEFT VENTRICULAR SYSTOLIC FUNCTION, EF 40%  . Av fistula repair    . Hip surgery Left 10/07/2010    Dr. Rodell Perna    . Coronary angioplasty with stent placement      RIGHT CORONARY ARTERY  . Mastectomy Left     left    Social History:   reports that she quit smoking  about 34 years ago. She has never used smokeless tobacco. She reports that she does not drink alcohol or use illicit drugs.  Family History  Problem Relation Age of Onset  . Heart disease Mother   . Heart attack Father   . Cancer Sister   . Cancer Brother     Medications: Patient's Medications  New Prescriptions   No medications on file  Previous Medications   ACETAMINOPHEN (TYLENOL) 325 MG TABLET    Take 650 mg by mouth daily. And q6 hrs prn   AMLODIPINE (NORVASC)  10 MG TABLET    Take 10 mg by mouth daily.   CALCITONIN, SALMON, (MIACALCIN/FORTICAL) 200 UNIT/ACT NASAL SPRAY    Place 1 spray into the nose daily. Alternate nostrils   CLONIDINE (CATAPRES) 0.1 MG TABLET    Take 0.1 mg by mouth daily as needed. Take 0.1mg  by mouth as need for systolic blood [id number]   HYDROXYPROPYL METHYLCELLULOSE / HYPROMELLOSE (ISOPTO TEARS / GONIOVISC) 2.5 % OPHTHALMIC SOLUTION    Place 1 drop into both eyes every 4 (four) hours as needed for dry eyes (dry eye).   LACTOSE FREE NUTRITION (BOOST PLUS) LIQD    Take 237 mLs by mouth 2 (two) times daily between meals.   LEVOTHYROXINE (SYNTHROID, LEVOTHROID) 25 MCG TABLET    Take 25 mcg by mouth daily before breakfast.   LOSARTAN (COZAAR) 50 MG TABLET    Take 1 tablet (50 mg total) by mouth daily.   METOPROLOL (LOPRESSOR) 50 MG TABLET    Take 50 mg by mouth 2 (two) times daily.   MIRABEGRON ER (MYRBETRIQ) 50 MG TB24    Take 50 mg by mouth daily.   MIRTAZAPINE (REMERON) 15 MG TABLET    Take 7.5 mg by mouth daily.   MULTIPLE VITAMINS-MINERALS (PRESERVISION AREDS PO)    Take 1 capsule by mouth. Twice a day   NITROSTAT 0.4 MG SL TABLET    Place 0.4 mg under the tongue as needed. Place 1 tablet under the tongue every 5 minutes as needed for chest pain up to 3 doses   OXYCODONE-ACETAMINOPHEN (PERCOCET/ROXICET) 5-325 MG PER TABLET    Take 1 tablet by mouth every 8 (eight) hours as needed for moderate pain.   POLYETHYL GLYCOL-PROPYL GLYCOL 0.4-0.3 % SOLN    Apply 1 drop to eye 4 (four) times daily - after meals and at bedtime.   POLYETHYLENE GLYCOL (MIRALAX / GLYCOLAX) PACKET    Take 17 g by mouth daily.   SENNOSIDES-DOCUSATE SODIUM (SENOKOT-S) 8.6-50 MG TABLET    Take 2 tablets by mouth at bedtime.   TOBRAMYCIN (TOBREX) 0.3 % OPHTHALMIC SOLUTION    Place 1 drop into the right eye every 6 (six) hours. Place 1 drop into the right eye every 6 hours 1 day prior to appoint  Modified Medications   No medications on file  Discontinued  Medications   No medications on file     Physical Exam: Filed Vitals:   05/31/15 1111  Pulse: 69  Temp: 98.3 F (36.8 C)  Resp: 20  Weight: 108 lb (48.988 kg)  SpO2: 92%   Wt Readings from Last 3 Encounters:  05/31/15 108 lb (48.988 kg)  03/23/15 106 lb (48.081 kg)  03/05/15 104 lb 9.6 oz (47.446 kg)    Physical Exam  Constitutional: No distress.  frail  HENT:  Head: Normocephalic.  Eyes: Pupils are equal, round, and reactive to light.  Neck: No JVD present. No tracheal deviation present. No thyromegaly present.  Cardiovascular:  Normal rate and regular rhythm.   Murmur heard. Pulmonary/Chest: Effort normal and breath sounds normal. No respiratory distress.  Abdominal: Soft. Bowel sounds are normal. She exhibits no distension. There is no tenderness.  Musculoskeletal: She exhibits no edema.  Lymphadenopathy:    She has no cervical adenopathy.  Skin: Skin is warm and dry. She is not diaphoretic.  Psychiatric: She has a normal mood and affect. Her behavior is normal.    Labs reviewed: Basic Metabolic Panel:  Recent Labs  11/27/14 0235 11/29/14 12/04/14 03/27/15  NA 127* 136* 135* 133*  K 4.8 5.1 4.9 4.6  CL 100  --   --   --   CO2 20  --   --   --   GLUCOSE 140*  --   --   --   BUN 26* 38* 28* 33*  CREATININE 1.07 1.4* 1.1 1.1  CALCIUM 8.5  --   --   --    Liver Function Tests:  Recent Labs  11/27/14 0235 03/27/15  AST 29 15  ALT 15 11  ALKPHOS 76 71  BILITOT 0.7  --   PROT 6.3  --   ALBUMIN 3.8  --    No results for input(s): LIPASE, AMYLASE in the last 8760 hours. No results for input(s): AMMONIA in the last 8760 hours. CBC:  Recent Labs  11/27/14 0235 12/28/14 01/11/15 03/27/15  WBC 15.6* 4.9 5.1 5.9  NEUTROABS 14.5*  --   --   --   HGB 9.1* 8.0* 8.8* 10.9*  HCT 27.5* 24* 27* 32*  MCV 93.9  --   --   --   PLT 184 211 241 227   Cardiac Enzymes:  Recent Labs  11/27/14 0235  TROPONINI 0.03   BNP: Invalid input(s):  POCBNP CBG:  Recent Labs  11/27/14 0721 11/28/14 0701  GLUCAP 125* 113*   Lab Results  Component Value Date   TSH 3.84 03/27/2015    12/28/14 Iron 30, TIBC 276, %sat 11  Imaging and Procedures Reviewed  CT of chest spiral 11/29/14  No evidence of pulmonary embolism. No acute cardiopulmonary disease.  Mildly displaced fracture of the head of the left clavicle.  The left perihilar density seen on the recent chest radiograph is due to a left pulmonary artery. There are 2 small pulmonary nodules as described with the larger measuring 4 mm over the anterior left upper lobe. Recommend follow-up noncontrast chest CT 1 year. This recommendation follows the consensus statement: Guidelines for Management of Small Pulmonary Nodules Detected on CT Scans: A Statement from the [place] as published in Radiology 2005; 237:395-400. Online at: https://www.arnold.com/.  Stable severe mid thoracic spine compression fracture.  Atherosclerotic coronary artery disease and moderate cardiomegaly.  11/26/14 pelvic xray reviewed  EXAM: PELVIS - 1-2 VIEW  COMPARISON: None.  FINDINGS: Previous gamma nail treatment of a left femur fracture. Question subtle evidence of inferior ramus fracture on the left. No other sign of acute pelvic injury.  IMPRESSION: Question inferior ramus fracture on the left.  2D echo 11/26/14 reviewed Study Conclusions  - Left ventricle: Turbulent flow through LV cavity. The cavity size was mildly reduced. Wall thickness was increased in a pattern of mild LVH. Systolic function was vigorous. The estimated ejection fraction was in the range of 65% to 70%. Doppler parameters are consistent with abnormal left ventricular relaxation (grade 1 diastolic dysfunction). - Aortic valve: AV is thickened, calcified with restricted motion. Peak and mean gradients through the valve are 88 and 43  mm  Hg respectively consistent with severe AS There was mild regurgitation. Valve area (VTI): 0.76 cm^2. Valve area (Vmax): 0.76 cm^2. - Mitral valve: Calcified annulus. Mildly thickened leaflets . - Right ventricle: The cavity size was mildly dilated. Wall thickness was normal. - Right atrium: The atrium was mildly dilated. - Pulmonary arteries: PA peak pressure: 55 mm Hg (S).  Assessment/Plan  1. Weight loss, unintentional Improved. Continue Remeron and monitor weights  2. Hypothyroidism, unspecified hypothyroidism type -stable, continue current supplementation  3. Essential hypertension Stable, continue lopressor, norvasc, and cozaar.   4. Aortic stenosis Continue BP control and monitoring weight and fluid status. No intervention due to age. Also sees Dr. Mare Ferrari.   5. Anemia  -improved over time without medication -continue to monitor, ACD  6. Alzheimer's disease Stable function. Not on meds due to age, weight, and debility. Continue supportive care.   7.  Constipation -controlled with Miralax and senna-s  8. CKD III -stable, continue to monitor  Cindi Carbon, [place] [phone]

## 2015-07-10 ENCOUNTER — Non-Acute Institutional Stay: Payer: Medicare Other | Admitting: Internal Medicine

## 2015-07-10 DIAGNOSIS — N183 Chronic kidney disease, stage 3 unspecified: Secondary | ICD-10-CM

## 2015-07-10 DIAGNOSIS — G301 Alzheimer's disease with late onset: Secondary | ICD-10-CM

## 2015-07-10 DIAGNOSIS — E039 Hypothyroidism, unspecified: Secondary | ICD-10-CM | POA: Diagnosis not present

## 2015-07-10 DIAGNOSIS — R634 Abnormal weight loss: Secondary | ICD-10-CM | POA: Diagnosis not present

## 2015-07-10 DIAGNOSIS — I350 Nonrheumatic aortic (valve) stenosis: Secondary | ICD-10-CM

## 2015-07-10 DIAGNOSIS — N3281 Overactive bladder: Secondary | ICD-10-CM | POA: Diagnosis not present

## 2015-07-10 DIAGNOSIS — F0280 Dementia in other diseases classified elsewhere without behavioral disturbance: Secondary | ICD-10-CM

## 2015-07-10 NOTE — Progress Notes (Signed)
Patient ID: Kendra Tapia, female   DOB: [birth year], 81 y.o.   MRN: [id number]  Location:  [place] [place] Provider:  Dejia Ebron L. Mariea Clonts, D.O., C.M.D.  Code Status:  DNR Goals of care: Advanced Directive information Does patient have an advance directive?: Yes, Type of Advance Directive: [place];Living will;Out of facility DNR (pink MOST or yellow form), Pre-existing out of facility DNR order (yellow form or pink MOST form): Yellow form placed in chart (order not valid for inpatient use)  Chief Complaint  Patient presents with  . Medical Management of Chronic Issues    HPI:  81 yo female with h/o AD, macular degeneration, CAD, aortic stenosis, hyponatremia, apical nodule on CXR, CKDIII, urinary incontinence and hyperlipidemia was seen for med mgt of chronic diseases.  She is doing very well per nursing staff. When seen, she c/o having to take her pants off to take her nap and that someone had done something to her baby (a doll she carries with her).  Apparently, she thought something was wrong with the doll's eye and it was the eyelashes.  She talked on and on about this situation.  She denies pain in her left shoulder right now.  She feels well.  She walks with a walker.  I tried to talk with her about whether she has much pain from the eye injections, but her dementia is too progressed to address this.  She has no chest pain or shortness of breath, says bowels move ok.    Review of Systems:  Review of Systems  Constitutional: Negative for fever and chills.  HENT: Negative for congestion.   Eyes:       Macular degeneration--gets eye injections, wears glasses  Respiratory: Negative for shortness of breath.   Cardiovascular: Negative for chest pain, palpitations and leg swelling.  Gastrointestinal: Negative for abdominal pain.  Genitourinary: Positive for urgency and frequency. Negative for dysuria.       Incontinent  Musculoskeletal: Positive for joint  pain. Negative for falls.  Neurological: Positive for weakness. Negative for dizziness and headaches.  Psychiatric/Behavioral: Positive for memory loss. Negative for depression.    Past Medical History  Diagnosis Date  . IHD (ischemic heart disease)   . Nausea   . Coronary artery disease   . Hypothyroidism   . Hyperlipidemia   . Dizziness   . Exudative senile macular degeneration of retina ([place]) 07/28/2012  . Tear film insufficiency, unspecified 07/28/2012  . Dermatophytosis of foot 05/10/2012  . Unspecified constipation 05/10/2012  . Unspecified urinary incontinence 05/10/2012  . Urethrocele(618.03) 02/11/2012  . Female stress incontinence 02/11/2012  . Depressive disorder, not elsewhere classified 11/10/2011  . Other malaise and fatigue 11/10/2011  . Loss of weight 11/10/2011  . Macular degeneration (senile) of retina, unspecified 09/24/2011  . Unspecified disorder of kidney and ureter 09/24/2011  . Pathologic fracture of vertebrae ([place]) 09/24/2011  . Memory loss 09/24/2011  . Abnormality of gait 09/24/2011  . Nocturia 09/24/2011  . Malignant neoplasm of breast (female), unspecified site 08/13/2011  . Hyposmolality and/or hyponatremia 08/13/2011  . Anemia, unspecified 08/13/2011  . Closed fracture of unspecified part of vertebral column without mention of spinal cord injury 08/13/2011  . Personal history of fall 08/13/2011  . Hypertension   . Memory deficit 03/02/2011     2012: HAs STMl, repeats herself, some difficulkty with sequence of events. Functional status, language skills are  intact.    Patient Active Problem List   Diagnosis Date Noted  .  Constipation 05/31/2015  . Alzheimer's disease 03/29/2015  . CKD (chronic kidney disease), stage III 01/02/2015  . Fx clavicle 12/01/2014  . Debility 12/01/2014  . Hip fracture ([place]) 11/29/2014  . Oxygen desaturation 11/27/2014  . Foot pain, bilateral 05/29/2014  . Osteoarthritis of both hips 03/01/2014  . Tooth abscess 09/28/2013    . Onychogryphosis 05/30/2013  . Pain of left great toe 05/30/2013  . Thoracic back pain 05/02/2013  . Urinary incontinence 03/01/2013  . Abnormality of gait 03/01/2013  . Hypertension   . Aortic stenosis 02/09/2012  . Macular degeneration (senile) of retina 09/24/2011  . Nocturia 09/24/2011  . Weight loss, unintentional 09/12/2011  . Anemia 08/13/2011  . Coronary artery disease 03/13/2011  . Benign hypertensive heart disease without heart failure 03/13/2011  . Hypothyroidism 03/13/2011  . Hypercholesterolemia 03/13/2011    Allergies  Allergen Reactions  . Lipitor [Atorvastatin Calcium]     MUSCLE ACHES  . Micardis [Telmisartan]   . Other     Adhesive tape,chocolate,citrus,berriespaper tape    Medications: Patient's Medications  New Prescriptions   No medications on file  Previous Medications   ACETAMINOPHEN (TYLENOL) 325 MG TABLET    Take 650 mg by mouth daily. And q6 hrs prn   AMLODIPINE (NORVASC) 10 MG TABLET    Take 10 mg by mouth daily.   CALCITONIN, SALMON, (MIACALCIN/FORTICAL) 200 UNIT/ACT NASAL SPRAY    Place 1 spray into the nose daily. Alternate nostrils   CLONIDINE (CATAPRES) 0.1 MG TABLET    Take 0.1 mg by mouth daily as needed. Take 0.1mg  by mouth as need for systolic blood [id number]   HYDROXYPROPYL METHYLCELLULOSE / HYPROMELLOSE (ISOPTO TEARS / GONIOVISC) 2.5 % OPHTHALMIC SOLUTION    Place 1 drop into both eyes every 4 (four) hours as needed for dry eyes (dry eye).   LACTOSE FREE NUTRITION (BOOST PLUS) LIQD    Take 237 mLs by mouth 2 (two) times daily between meals.   LEVOTHYROXINE (SYNTHROID, LEVOTHROID) 25 MCG TABLET    Take 25 mcg by mouth daily before breakfast.   LOSARTAN (COZAAR) 50 MG TABLET    Take 1 tablet (50 mg total) by mouth daily.   METOPROLOL (LOPRESSOR) 50 MG TABLET    Take 50 mg by mouth 2 (two) times daily.   MIRABEGRON ER (MYRBETRIQ) 50 MG TB24    Take 50 mg by mouth daily.   MIRTAZAPINE (REMERON) 15 MG TABLET    Take 7.5 mg by mouth  daily.   MULTIPLE VITAMINS-MINERALS (PRESERVISION AREDS PO)    Take 1 capsule by mouth. Twice a day   NITROSTAT 0.4 MG SL TABLET    Place 0.4 mg under the tongue as needed. Place 1 tablet under the tongue every 5 minutes as needed for chest pain up to 3 doses   OXYCODONE-ACETAMINOPHEN (PERCOCET/ROXICET) 5-325 MG PER TABLET    Take 1 tablet by mouth every 8 (eight) hours as needed for moderate pain.   POLYETHYL GLYCOL-PROPYL GLYCOL 0.4-0.3 % SOLN    Apply 1 drop to eye 4 (four) times daily - after meals and at bedtime.   POLYETHYLENE GLYCOL (MIRALAX / GLYCOLAX) PACKET    Take 17 g by mouth daily.   SENNOSIDES-DOCUSATE SODIUM (SENOKOT-S) 8.6-50 MG TABLET    Take 2 tablets by mouth at bedtime.   TOBRAMYCIN (TOBREX) 0.3 % OPHTHALMIC SOLUTION    Place 1 drop into the right eye every 6 (six) hours. Place 1 drop into the right eye every 6 hours 1 day prior to appoint  Modified Medications   No medications on file  Discontinued Medications   No medications on file    Physical Exam: Filed Vitals:   07/10/15 1024  BP: 111/63  Pulse: 64  Temp: 97.6 F (36.4 C)  Resp: 16  Height: 4\' 9"  (1.448 m)  Weight: 111 lb (50.349 kg)  SpO2: 93%   Body mass index is 24.01 kg/(m^2).  Physical Exam  Constitutional: She appears well-developed and well-nourished.  Cardiovascular: Normal rate, regular rhythm and intact distal pulses.   Murmur heard. Pulmonary/Chest: Effort normal and breath sounds normal. No respiratory distress.  Abdominal: Soft. Bowel sounds are normal. She exhibits no distension. There is no tenderness.  Musculoskeletal: Normal range of motion. She exhibits tenderness.  Left clavicular area and slight decrease ROM of left arm remains  Neurological: She is alert.  Skin: Skin is warm and dry.  Psychiatric: She has a normal mood and affect.  Disorganized thinking; delusions, but very pleasant    Labs reviewed: Basic Metabolic Panel:  Recent Labs  11/27/14 0235 11/29/14 12/04/14  03/27/15  NA 127* 136* 135* 133*  K 4.8 5.1 4.9 4.6  CL 100  --   --   --   CO2 20  --   --   --   GLUCOSE 140*  --   --   --   BUN 26* 38* 28* 33*  CREATININE 1.07 1.4* 1.1 1.1  CALCIUM 8.5  --   --   --     Liver Function Tests:  Recent Labs  11/27/14 0235 03/27/15  AST 29 15  ALT 15 11  ALKPHOS 76 71  BILITOT 0.7  --   PROT 6.3  --   ALBUMIN 3.8  --     CBC:  Recent Labs  11/27/14 0235 12/28/14 01/11/15 03/27/15  WBC 15.6* 4.9 5.1 5.9  NEUTROABS 14.5*  --   --   --   HGB 9.1* 8.0* 8.8* 10.9*  HCT 27.5* 24* 27* 32*  MCV 93.9  --   --   --   PLT 184 211 241 227    Lab Results  Component Value Date   TSH 3.84 03/27/2015   No results found for: HGBA1C Lab Results  Component Value Date   CHOL 174 03/13/2011   HDL 65.50 03/13/2011   LDLCALC 95 03/13/2011   TRIG 67.0 03/13/2011   CHOLHDL 3 03/13/2011    Significant Diagnostic Results since last visit:  Latest CXR was unremarkable when Na low and hypoxic  Patient Care Team: Gayland Curry, DO as PCP - General (Geriatric Medicine) [facility] Darlin Coco, MD as Consulting Physician (Cardiology)  Assessment/Plan 1. Weight loss, unintentional -seems weight has stabilized with supplements and increased assistance -cont low dose remeron  2. CKD (chronic kidney disease), stage III -stable, cont to avoid nsaids, encourage hydration  3. Late onset Alzheimer's disease without behavioral disturbance --she is not on any memory meds, gradually declining cognitively  4. Overactive bladder -continue myrbetriq--felt to be helpful  5. Aortic stenosis -asymptomatic at present in terms of SAD symptoms  6. Hypothyroidism, unspecified hypothyroidism type -cont current synthroid which is only 46mcg before breakfast -last tsh normal in june  Family/ staff Communication: discussed with memory care LPN  Labs/tests ordered:  No new  Georgia Delsignore L. Jennalyn Cawley, D.O. [facility] [address], [place] [address] Cell Phone (Mon-Fri 8am-5pm):  [phone] On Call:  [phone] & follow prompts after 5pm &  weekends Office Phone:  [phone] Office Fax:  [phone]

## 2015-07-26 ENCOUNTER — Non-Acute Institutional Stay: Payer: Medicare Other | Admitting: Adult Health

## 2015-07-26 ENCOUNTER — Encounter: Payer: Self-pay | Admitting: Adult Health

## 2015-07-26 DIAGNOSIS — I2510 Atherosclerotic heart disease of native coronary artery without angina pectoris: Secondary | ICD-10-CM | POA: Diagnosis not present

## 2015-07-26 DIAGNOSIS — R634 Abnormal weight loss: Secondary | ICD-10-CM

## 2015-07-26 DIAGNOSIS — I2583 Coronary atherosclerosis due to lipid rich plaque: Secondary | ICD-10-CM

## 2015-07-26 NOTE — Progress Notes (Signed)
Patient ID: Kendra Tapia, female   DOB: Aug 09, 1912, 81 y.o.   MRN: [id number]    Location:  [place]    Code Status: DNR  Allergies  Allergen Reactions  . Lipitor [Atorvastatin Calcium]     MUSCLE ACHES  . Micardis [Telmisartan]   . Other     Adhesive tape,chocolate,citrus,berriespaper tape    Chief Complaint  Patient presents with  . Acute Visit    weight gain    HPI: 81 y.o. female currently residing in memory care. She has a hx of IHD, AS, FTT, hypothyroidism, HLD, anemia, HTN, dementia,  and falls. She has been on Remeron and her weight has trended upward to 110 lbs.  There are no signs of edema or SOB. There no symptoms of depression, sleeping well, appetite improving, no tearfulness. She remains verbal and able to f/c but lacks awareness regarding her current medical situation. During our visit she reported chest pain but did not seem uncomfortable and was not diaphoretic. The nurse came in to bring her a nitro and at that time she denied pain and smiled.   ROS: Review of Systems  Constitutional: Negative for fever, chills and diaphoresis.  HENT: Negative for congestion.   Respiratory: Negative for cough and shortness of breath.   Cardiovascular: Negative for chest pain and leg swelling.  Gastrointestinal: Negative for heartburn, abdominal pain and constipation.  Genitourinary: Negative for dysuria.  Musculoskeletal: Positive for joint pain.  Neurological: Negative for dizziness, seizures and loss of consciousness.   Past Medical History  Diagnosis Date  . IHD (ischemic heart disease)   . Nausea   . Coronary artery disease   . Hypothyroidism   . Hyperlipidemia   . Dizziness   . Exudative senile macular degeneration of retina ([place]) 07/28/2012  . Tear film insufficiency, unspecified 07/28/2012  . Dermatophytosis of foot 05/10/2012  . Unspecified constipation 05/10/2012  . Unspecified urinary incontinence 05/10/2012  .  Urethrocele(618.03) 02/11/2012  . Female stress incontinence 02/11/2012  . Depressive disorder, not elsewhere classified 11/10/2011  . Other malaise and fatigue 11/10/2011  . Loss of weight 11/10/2011  . Macular degeneration (senile) of retina, unspecified 09/24/2011  . Unspecified disorder of kidney and ureter 09/24/2011  . Pathologic fracture of vertebrae ([place]) 09/24/2011  . Memory loss 09/24/2011  . Abnormality of gait 09/24/2011  . Nocturia 09/24/2011  . Malignant neoplasm of breast (female), unspecified site 08/13/2011  . Hyposmolality and/or hyponatremia 08/13/2011  . Anemia, unspecified 08/13/2011  . Closed fracture of unspecified part of vertebral column without mention of spinal cord injury 08/13/2011  . Personal history of fall 08/13/2011  . Hypertension   . Memory deficit 03/02/2011     2012: HAs STMl, repeats herself, some difficulkty with sequence of events. Functional status, language skills are  intact.   Past Surgical History  Procedure Laterality Date  . Coronary angioplasty  03/23/94    NORMAL LEFT VENTRICULAR SYSTOLIC FUNCTION, EF 78%  . Av fistula repair    . Hip surgery Left 10/07/2010    Dr. Rodell Perna    . Coronary angioplasty with stent placement      RIGHT CORONARY ARTERY  . Mastectomy Left     left    Social History:   reports that she quit smoking about 34 years ago. She has never used smokeless tobacco. She reports that she does not drink alcohol or use illicit drugs.  Family History  Problem Relation Age of Onset  . Heart disease  Mother   . Heart attack Father   . Cancer Sister   . Cancer Brother     Medications: Patient's Medications  New Prescriptions   No medications on file  Previous Medications   ACETAMINOPHEN (TYLENOL) 325 MG TABLET    Take 650 mg by mouth daily. And q6 hrs prn   AMLODIPINE (NORVASC) 10 MG TABLET    Take 10 mg by mouth daily.   CALCITONIN, SALMON, (MIACALCIN/FORTICAL) 200 UNIT/ACT NASAL SPRAY    Place 1 spray into the nose  daily. Alternate nostrils   CLONIDINE (CATAPRES) 0.1 MG TABLET    Take 0.1 mg by mouth daily as needed. Take 0.1mg  by mouth as need for systolic blood [id number]   HYDROXYPROPYL METHYLCELLULOSE / HYPROMELLOSE (ISOPTO TEARS / GONIOVISC) 2.5 % OPHTHALMIC SOLUTION    Place 1 drop into both eyes every 4 (four) hours as needed for dry eyes (dry eye).   LACTOSE FREE NUTRITION (BOOST PLUS) LIQD    Take 237 mLs by mouth 2 (two) times daily between meals.   LEVOTHYROXINE (SYNTHROID, LEVOTHROID) 25 MCG TABLET    Take 25 mcg by mouth daily before breakfast.   LOSARTAN (COZAAR) 50 MG TABLET    Take 1 tablet (50 mg total) by mouth daily.   METOPROLOL (LOPRESSOR) 50 MG TABLET    Take 50 mg by mouth 2 (two) times daily.   MIRABEGRON ER (MYRBETRIQ) 50 MG TB24    Take 50 mg by mouth daily.   MIRTAZAPINE (REMERON) 15 MG TABLET    Take 7.5 mg by mouth daily.   MULTIPLE VITAMINS-MINERALS (PRESERVISION AREDS PO)    Take 1 capsule by mouth. Twice a day   NITROSTAT 0.4 MG SL TABLET    Place 0.4 mg under the tongue as needed. Place 1 tablet under the tongue every 5 minutes as needed for chest pain up to 3 doses   OXYCODONE-ACETAMINOPHEN (PERCOCET/ROXICET) 5-325 MG PER TABLET    Take 1 tablet by mouth every 8 (eight) hours as needed for moderate pain.   POLYETHYL GLYCOL-PROPYL GLYCOL 0.4-0.3 % SOLN    Apply 1 drop to eye 4 (four) times daily - after meals and at bedtime.   POLYETHYLENE GLYCOL (MIRALAX / GLYCOLAX) PACKET    Take 17 g by mouth daily.   SENNOSIDES-DOCUSATE SODIUM (SENOKOT-S) 8.6-50 MG TABLET    Take 2 tablets by mouth at bedtime.   TOBRAMYCIN (TOBREX) 0.3 % OPHTHALMIC SOLUTION    Place 1 drop into the right eye every 6 (six) hours. Place 1 drop into the right eye every 6 hours 1 day prior to appoint  Modified Medications   No medications on file  Discontinued Medications   No medications on file     Physical Exam: Filed Vitals:   07/26/15 1505  BP: 140/75  Pulse: 70  Temp: 96.9 F (36.1 C)    Resp: 16  Weight: 110 lb (49.896 kg)  SpO2: 91%   Wt Readings from Last 3 Encounters:  07/26/15 110 lb (49.896 kg)  05/31/15 108 lb (48.988 kg)  03/23/15 106 lb (48.081 kg)    Physical Exam  Constitutional: No distress.  frail  HENT:  Head: Normocephalic.  Eyes: Pupils are equal, round, and reactive to light.  Neck: No JVD present. No tracheal deviation present. No thyromegaly present.  Cardiovascular: Normal rate and regular rhythm.   Murmur (systolic) heard. Pulmonary/Chest: Effort normal and breath sounds normal. No respiratory distress.  Abdominal: Soft. Bowel sounds are normal. She exhibits no distension. There is  no tenderness.  Musculoskeletal: She exhibits no edema.  Lymphadenopathy:    She has no cervical adenopathy.  Skin: Skin is warm and dry. She is not diaphoretic.  Psychiatric: She has a normal mood and affect. Her behavior is normal.    Labs reviewed: Basic Metabolic Panel:  Recent Labs  11/27/14 0235 11/29/14 12/04/14 03/27/15  NA 127* 136* 135* 133*  K 4.8 5.1 4.9 4.6  CL 100  --   --   --   CO2 20  --   --   --   GLUCOSE 140*  --   --   --   BUN 26* 38* 28* 33*  CREATININE 1.07 1.4* 1.1 1.1  CALCIUM 8.5  --   --   --    Liver Function Tests:  Recent Labs  11/27/14 0235 03/27/15  AST 29 15  ALT 15 11  ALKPHOS 76 71  BILITOT 0.7  --   PROT 6.3  --   ALBUMIN 3.8  --    No results for input(s): LIPASE, AMYLASE in the last 8760 hours. No results for input(s): AMMONIA in the last 8760 hours. CBC:  Recent Labs  11/27/14 0235 12/28/14 01/11/15 03/27/15  WBC 15.6* 4.9 5.1 5.9  NEUTROABS 14.5*  --   --   --   HGB 9.1* 8.0* 8.8* 10.9*  HCT 27.5* 24* 27* 32*  MCV 93.9  --   --   --   PLT 184 211 241 227   Cardiac Enzymes:  Recent Labs  11/27/14 0235  TROPONINI 0.03   BNP: Invalid input(s): POCBNP CBG:  Recent Labs  11/27/14 0721 11/28/14 0701  GLUCAP 125* 113*   Lab Results  Component Value Date   TSH 3.84 03/27/2015     12/28/14 Iron 30, TIBC 276, %sat 11  Imaging and Procedures Reviewed  CT of chest spiral 11/29/14  No evidence of pulmonary embolism. No acute cardiopulmonary disease.  Mildly displaced fracture of the head of the left clavicle.  The left perihilar density seen on the recent chest radiograph is due to a left pulmonary artery. There are 2 small pulmonary nodules as described with the larger measuring 4 mm over the anterior left upper lobe. Recommend follow-up noncontrast chest CT 1 year. This recommendation follows the consensus statement: Guidelines for Management of Small Pulmonary Nodules Detected on CT Scans: A Statement from the [place] as published in Radiology 2005; 237:395-400. Online at: https://www.arnold.com/.  Stable severe mid thoracic spine compression fracture.  Atherosclerotic coronary artery disease and moderate cardiomegaly.  11/26/14 pelvic xray reviewed  EXAM: PELVIS - 1-2 VIEW  COMPARISON: None.  FINDINGS: Previous gamma nail treatment of a left femur fracture. Question subtle evidence of inferior ramus fracture on the left. No other sign of acute pelvic injury.  IMPRESSION: Question inferior ramus fracture on the left.  2D echo 11/26/14 reviewed Study Conclusions  - Left ventricle: Turbulent flow through LV cavity. The cavity size was mildly reduced. Wall thickness was increased in a pattern of mild LVH. Systolic function was vigorous. The estimated ejection fraction was in the range of 65% to 70%. Doppler parameters are consistent with abnormal left ventricular relaxation (grade 1 diastolic dysfunction). - Aortic valve: AV is thickened, calcified with restricted motion. Peak and mean gradients through the valve are 88 and 43 mm Hg respectively consistent with severe AS There was mild regurgitation. Valve area (VTI): 0.76 cm^2. Valve area (Vmax): 0.76 cm^2. - Mitral valve:  Calcified annulus. Mildly thickened leaflets . -  Right ventricle: The cavity size was mildly dilated. Wall thickness was normal. - Right atrium: The atrium was mildly dilated. - Pulmonary arteries: PA peak pressure: 55 mm Hg (S).  Assessment/Plan  1) Weight loss -weight trending upward now, continue Remeron at this time for any additional benefit  2) CAD -remains on metoprolol and prn nitro (has not used) -no signs of angina, continue to monitor -not on aspirin due to anemia   Cindi Carbon, [place] [phone]

## 2015-08-24 ENCOUNTER — Encounter: Payer: Self-pay | Admitting: Adult Health

## 2015-08-24 ENCOUNTER — Non-Acute Institutional Stay: Payer: Medicare Other | Admitting: Adult Health

## 2015-08-24 DIAGNOSIS — R519 Headache, unspecified: Secondary | ICD-10-CM

## 2015-08-24 DIAGNOSIS — R51 Headache: Secondary | ICD-10-CM

## 2015-08-24 NOTE — Progress Notes (Signed)
Patient ID: Kendra Tapia, female   DOB: December 12, 1911, 81 y.o.   MRN: [id number]     Nursing Home Location:  [place]   Code Status: DNR  Patient Care Team: Gayland Curry, DO as PCP - General (Geriatric Medicine) [facility] Darlin Coco, MD as Consulting Physician (Cardiology)   Place of Service: ALF [phone])  Chief Complaint  Patient presents with  . Acute Visit    right sided facial pain    HPI:  81 y.o. female residing at [facility], assisted living. I was asked to see her today due to complaints of right sided facial pain which started on 11/10 in the evening. She has a hx of dementia and can not elaborate further. She is afebrile without cough, congestion, SOB, CP, ear drainage, facial swelling, or rash.     Review of Systems:  Review of Systems  Constitutional: Negative for fever, chills, diaphoresis, activity change, appetite change and fatigue.  HENT: Negative for congestion, dental problem, ear discharge, ear pain, facial swelling, nosebleeds, postnasal drip and rhinorrhea.   Respiratory: Negative for cough, shortness of breath and wheezing.   Cardiovascular: Negative for chest pain.  Skin: Negative for color change, rash and wound.  Psychiatric/Behavioral: Positive for confusion. Negative for behavioral problems and agitation.    Medications: Patient's Medications  New Prescriptions   No medications on file  Previous Medications   ACETAMINOPHEN (TYLENOL) 325 MG TABLET    Take 650 mg by mouth daily. And q6 hrs prn   AMLODIPINE (NORVASC) 10 MG TABLET    Take 10 mg by mouth daily.   CALCITONIN, SALMON, (MIACALCIN/FORTICAL) 200 UNIT/ACT NASAL SPRAY    Place 1 spray into the nose daily. Alternate nostrils   CLONIDINE (CATAPRES) 0.1 MG TABLET    Take 0.1 mg by mouth daily as needed. Take 0.1mg  by mouth as need for systolic blood 99991111   HYDROXYPROPYL METHYLCELLULOSE / HYPROMELLOSE (ISOPTO  TEARS / GONIOVISC) 2.5 % OPHTHALMIC SOLUTION    Place 1 drop into both eyes every 4 (four) hours as needed for dry eyes (dry eye).   LACTOSE FREE NUTRITION (BOOST PLUS) LIQD    Take 237 mLs by mouth 2 (two) times daily between meals.   LEVOTHYROXINE (SYNTHROID, LEVOTHROID) 25 MCG TABLET    Take 25 mcg by mouth daily before breakfast.   LOSARTAN (COZAAR) 50 MG TABLET    Take 1 tablet (50 mg total) by mouth daily.   METOPROLOL (LOPRESSOR) 50 MG TABLET    Take 50 mg by mouth 2 (two) times daily.   MIRABEGRON ER (MYRBETRIQ) 50 MG TB24    Take 50 mg by mouth daily.   MIRTAZAPINE (REMERON) 15 MG TABLET    Take 7.5 mg by mouth daily.   MULTIPLE VITAMINS-MINERALS (PRESERVISION AREDS PO)    Take 1 capsule by mouth. Twice a day   NITROSTAT 0.4 MG SL TABLET    Place 0.4 mg under the tongue as needed. Place 1 tablet under the tongue every 5 minutes as needed for chest pain up to 3 doses   OXYCODONE-ACETAMINOPHEN (PERCOCET/ROXICET) 5-325 MG PER TABLET    Take 1 tablet by mouth every 8 (eight) hours as needed for moderate pain.   POLYETHYL GLYCOL-PROPYL GLYCOL 0.4-0.3 % SOLN    Apply 1 drop to eye 4 (four) times daily - after meals and at bedtime.   POLYETHYLENE GLYCOL (MIRALAX / GLYCOLAX) PACKET    Take 17 g by mouth daily.   SENNOSIDES-DOCUSATE SODIUM (  SENOKOT-S) 8.6-50 MG TABLET    Take 2 tablets by mouth at bedtime.   TOBRAMYCIN (TOBREX) 0.3 % OPHTHALMIC SOLUTION    Place 1 drop into the right eye every 6 (six) hours. Place 1 drop into the right eye every 6 hours 1 day prior to appoint  Modified Medications   No medications on file  Discontinued Medications   No medications on file     Physical Exam:  120/60 16 96.8 64  Physical Exam  Constitutional: No distress.  HENT:  Head: Normocephalic and atraumatic.  Right Ear: External ear normal.  Left Ear: External ear normal.  Nose: Nose normal.  Mouth/Throat: Oropharynx is clear and moist. Normal dentition. No dental abscesses. No oropharyngeal  exudate.  No facial swelling or pain on palpation to the right side  Eyes: Conjunctivae are normal. Pupils are equal, round, and reactive to light. Right eye exhibits no discharge. Left eye exhibits no discharge.  Neck: No JVD present. No thyromegaly present.  Cardiovascular: Normal rate and regular rhythm.   Murmur heard. Systolic murmur 3/6  Pulmonary/Chest: Effort normal and breath sounds normal. No respiratory distress.  Abdominal: Soft. Bowel sounds are normal. She exhibits no distension.  Lymphadenopathy:    She has no cervical adenopathy.  Skin: Skin is warm and dry. She is not diaphoretic.  Psychiatric: Affect normal.    Wt Readings from Last 3 Encounters:  07/26/15 110 lb (49.896 kg)  05/31/15 108 lb (48.988 kg)  03/23/15 106 lb (48.081 kg)     Labs reviewed/Significant Diagnostic Results:  Basic Metabolic Panel:  Recent Labs  11/27/14 0235 11/29/14 12/04/14 03/27/15  NA 127* 136* 135* 133*  K 4.8 5.1 4.9 4.6  CL 100  --   --   --   CO2 20  --   --   --   GLUCOSE 140*  --   --   --   BUN 26* 38* 28* 33*  CREATININE 1.07 1.4* 1.1 1.1  CALCIUM 8.5  --   --   --    Liver Function Tests:  Recent Labs  11/27/14 0235 03/27/15  AST 29 15  ALT 15 11  ALKPHOS 76 71  BILITOT 0.7  --   PROT 6.3  --   ALBUMIN 3.8  --    No results for input(s): LIPASE, AMYLASE in the last 8760 hours. No results for input(s): AMMONIA in the last 8760 hours. CBC:  Recent Labs  11/27/14 0235 12/28/14 01/11/15 03/27/15  WBC 15.6* 4.9 5.1 5.9  NEUTROABS 14.5*  --   --   --   HGB 9.1* 8.0* 8.8* 10.9*  HCT 27.5* 24* 27* 32*  MCV 93.9  --   --   --   PLT 184 211 241 227   CBG:  Recent Labs  11/27/14 0721 11/28/14 0701  GLUCAP 125* 113*   TSH:  Recent Labs  11/30/14 03/27/15  TSH 1.57 3.84   A1C: No results found for: HGBA1C Lipid Panel: No results for input(s): CHOL, HDL, LDLCALC, TRIG, CHOLHDL, LDLDIRECT in the last 8760 hours.     Assessment/Plan  1)  Right sided facial pain -present for 1 day with no signs of dental abscess, shingles rash, or facial swelling -no abnormality noted to the right ear -? Early parotitis, no swelling or redness at this point. If these symptoms develop will need antimicrobial therapy -continue to monitor symptoms and report to [facility]  CBC, BMP next draw   Cindi Carbon, [place] [phone])  [phone]  

## 2015-08-25 ENCOUNTER — Encounter: Payer: Self-pay | Admitting: Internal Medicine

## 2015-08-28 LAB — CBC AND DIFFERENTIAL
HCT: 31 % — AB (ref 36–46)
HEMOGLOBIN: 10.6 g/dL — AB (ref 12.0–16.0)
PLATELETS: 217 10*3/uL (ref 150–399)
WBC: 5.1 10*3/mL

## 2015-08-28 LAB — BASIC METABOLIC PANEL
BUN: 29 mg/dL — AB (ref 4–21)
Creatinine: 1.1 mg/dL (ref 0.5–1.1)
POTASSIUM: 5 mmol/L (ref 3.4–5.3)
Sodium: 134 mmol/L — AB (ref 137–147)

## 2015-09-05 ENCOUNTER — Ambulatory Visit (INDEPENDENT_AMBULATORY_CARE_PROVIDER_SITE_OTHER): Payer: Medicare Other | Admitting: Cardiology

## 2015-09-05 ENCOUNTER — Encounter: Payer: Self-pay | Admitting: Cardiology

## 2015-09-05 ENCOUNTER — Telehealth: Payer: Self-pay | Admitting: Cardiology

## 2015-09-05 VITALS — BP 114/80 | HR 68 | Ht 62.0 in

## 2015-09-05 DIAGNOSIS — I119 Hypertensive heart disease without heart failure: Secondary | ICD-10-CM

## 2015-09-05 DIAGNOSIS — I350 Nonrheumatic aortic (valve) stenosis: Secondary | ICD-10-CM

## 2015-09-05 DIAGNOSIS — I2510 Atherosclerotic heart disease of native coronary artery without angina pectoris: Secondary | ICD-10-CM | POA: Diagnosis not present

## 2015-09-05 DIAGNOSIS — I2583 Coronary atherosclerosis due to lipid rich plaque: Secondary | ICD-10-CM

## 2015-09-05 NOTE — Telephone Encounter (Signed)
New Message  Nurses with [facility] called for the pt request a call back to discuss who will be the pt's cardiologist after Dr. Mare Ferrari. Please call back to discuss.

## 2015-09-05 NOTE — Progress Notes (Signed)
Cardiology Office Note   Date:  09/05/2015   ID:  SOYNA FRIER, DOB [birth year], MRN [id number]  PCP:  Hollace Kinnier, DO  Cardiologist: Darlin Coco MD  Chief Complaint  Patient presents with  . Coronary Artery Disease      History of Present Illness: Kendra Tapia is a 81 y.o. female who presents for  Six-month follow-up visit  This pleasant 81 year old woman is seen for a six-month followup office visit. She has a history of ischemic heart disease and a history of aortic stenosis and a history of essential hypertension. She also has hypothyroidism and hypercholesterolemia. Now lives in  Memory care unit at [facility].  She has not been experiencing any prolonged chest discomfort. She has occasional brief chest pain which does not require sublingual nitroglycerin. She has a known murmur of severe aortic stenosis. She is not a candidate for surgical intervention because of her age and comorbidities. Since last visit she has been doing well with no new cardiac complaints. Since we last saw her she celebrated her 18th birthday party in October. She has been feeling well. She had had some problems with elevated blood pressure which has responded to the addition of amlodipine 10 mg daily. She has had some declining memory and is now in the memory unit. She  Experiences occasional chest pain under her left breast. It is fleeting and does not last. She has not had to take any recent nitroglycerin.  Past Medical History  Diagnosis Date  . IHD (ischemic heart disease)   . Nausea   . Coronary artery disease   . Hypothyroidism   . Hyperlipidemia   . Dizziness   . Exudative senile macular degeneration of retina ([place]) 07/28/2012  . Tear film insufficiency, unspecified 07/28/2012  . Dermatophytosis of foot 05/10/2012  . Unspecified constipation 05/10/2012  . Unspecified urinary incontinence 05/10/2012  . Urethrocele(618.03) 02/11/2012  . Female stress incontinence 02/11/2012  .  Depressive disorder, not elsewhere classified 11/10/2011  . Other malaise and fatigue 11/10/2011  . Loss of weight 11/10/2011  . Macular degeneration (senile) of retina, unspecified 09/24/2011  . Unspecified disorder of kidney and ureter 09/24/2011  . Pathologic fracture of vertebrae ([place]) 09/24/2011  . Memory loss 09/24/2011  . Abnormality of gait 09/24/2011  . Nocturia 09/24/2011  . Malignant neoplasm of breast (female), unspecified site 08/13/2011  . Hyposmolality and/or hyponatremia 08/13/2011  . Anemia, unspecified 08/13/2011  . Closed fracture of unspecified part of vertebral column without mention of spinal cord injury 08/13/2011  . Personal history of fall 08/13/2011  . Hypertension   . Memory deficit 03/02/2011     2012: HAs STMl, repeats herself, some difficulkty with sequence of events. Functional status, language skills are  intact.    Past Surgical History  Procedure Laterality Date  . Coronary angioplasty  03/23/94    NORMAL LEFT VENTRICULAR SYSTOLIC FUNCTION, EF [id number]  . Av fistula repair    . Hip surgery Left 10/07/2010    Dr. Rodell Perna    . Coronary angioplasty with stent placement      RIGHT CORONARY ARTERY  . Mastectomy Left     left      Current Outpatient Prescriptions  Medication Sig Dispense Refill  . acetaminophen (TYLENOL) 325 MG tablet Take 650 mg by mouth daily. And every 6 hours as needed for pain    . amLODipine (NORVASC) 10 MG tablet Take 10 mg by mouth daily.    . calcitonin, salmon, (MIACALCIN/FORTICAL) 200 UNIT/ACT nasal  spray Place 1 spray into the nose daily. Alternate nostrils    . cloNIDine (CATAPRES) 0.1 MG tablet Take 0.1 mg by mouth daily as needed. Take 0.1mg  by mouth as need for systolic blood 99991111    . hydroxypropyl methylcellulose / hypromellose (ISOPTO TEARS / GONIOVISC) 2.5 % ophthalmic solution Place 1 drop into both eyes every 4 (four) hours as needed for dry eyes (dry eye).    Marland Kitchen lactose free nutrition (BOOST PLUS) LIQD Take  237 mLs by mouth 2 (two) times daily between meals.    Marland Kitchen levothyroxine (SYNTHROID, LEVOTHROID) 25 MCG tablet Take 25 mcg by mouth daily before breakfast.    . losartan (COZAAR) 50 MG tablet Take 1 tablet (50 mg total) by mouth daily. 30 tablet 6  . metoprolol (LOPRESSOR) 50 MG tablet Take 50 mg by mouth 2 (two) times daily.    . mirabegron ER (MYRBETRIQ) 50 MG TB24 Take 50 mg by mouth daily.    . mirtazapine (REMERON) 15 MG tablet Take 7.5 mg by mouth daily.    . Multiple Vitamins-Minerals (PRESERVISION AREDS PO) Take 1 capsule by mouth. Twice a day    . NITROSTAT 0.4 MG SL tablet Place 0.4 mg under the tongue as needed. Place 1 tablet under the tongue every 5 minutes as needed for chest pain up to 3 doses    . oxyCODONE-acetaminophen (PERCOCET/ROXICET) 5-325 MG per tablet Take 1 tablet by mouth every 8 (eight) hours as needed for moderate pain. 30 tablet 0  . Polyethyl Glycol-Propyl Glycol 0.4-0.3 % SOLN Apply 1 drop to eye 4 (four) times daily - after meals and at bedtime.    . polyethylene glycol (MIRALAX / GLYCOLAX) packet Take 17 g by mouth daily.    . sennosides-docusate sodium (SENOKOT-S) 8.6-50 MG tablet Take 2 tablets by mouth at bedtime.    Marland Kitchen tobramycin (TOBREX) 0.3 % ophthalmic solution Place 1 drop into the right eye every 6 (six) hours. Place 1 drop into the right eye every 6 hours 1 day prior to appoint     No current facility-administered medications for this visit.    Allergies:   Lipitor; Micardis; and Other    Social History:  The patient  reports that she quit smoking about 34 years ago. She has never used smokeless tobacco. She reports that she does not drink alcohol or use illicit drugs.   Family History:  The patient's family history includes Cancer in her brother and sister; Heart attack in her father; Heart disease in her mother.    ROS:  Please see the history of present illness.   Otherwise, review of systems are positive for none.   All other systems are reviewed  and negative.    PHYSICAL EXAM: VS:  BP 114/80 mmHg  Pulse 68  Ht 5\' 2"  (1.575 m)  SpO2 94% , BMI There is no weight on file to calculate BMI. GEN: Well nourished, well developed, in no acute distress HEENT: normal Neck: no JVD, carotid bruits, or masses Cardiac: RRR;  There is a grade 3/6 harsh systolic ejection murmur at the aortic area. No rubs, or gallops,no edema  Respiratory:  clear to auscultation bilaterally, normal work of breathing GI: soft, nontender, nondistended, + BS MS: no deformity or atrophy Skin: warm and dry, no rash Neuro:  Strength and sensation are intact Psych: euthymic mood, full affect   EKG:  EKG is not ordered today.    Recent Labs: 11/27/2014: B Natriuretic Peptide 271.5* 03/27/2015: ALT 11; BUN 33*;  Creatinine 1.1; Hemoglobin 10.9*; Platelets 227; Potassium 4.6; Sodium 133*; TSH 3.84    Lipid Panel    Component Value Date/Time   CHOL 174 03/13/2011 1017   TRIG 67.0 03/13/2011 1017   HDL 65.50 03/13/2011 1017   CHOLHDL 3 03/13/2011 1017   VLDL 13.4 03/13/2011 1017   LDLCALC 95 03/13/2011 1017      Wt Readings from Last 3 Encounters:  07/26/15 110 lb (49.896 kg)  07/10/15 111 lb (50.349 kg)  05/31/15 108 lb (48.988 kg)       ASSESSMENT AND PLAN:  1. Hypertensive heart disease without heart failure 2. Ischemic heart disease status post remote PCI in [facility] many years ago. Rare angina pectoris. Normal left ventricular systolic function with ejection fraction 65-70% by echocardiogram 11/27/14 3. Aortic stenosis ,  Severe. Echocardiogram 11/27/14 shows peak gradient 88 and mean gradient of 43 4. Hypothyroidism , on medication, clinically euthyroid 5. Age-related memory decrease 6.  DO NOT RESUSCITATE status  Current medicines are reviewed at length with the patient today.  The patient does not have concerns regarding medicines.  The following changes have been made:  no change  Labs/ tests ordered today include:   No orders of the defined types were placed in this encounter.      disposition: continue current medication. Recheck in 6 months for office visit and EKG with Dr. Meda Coffee  Signed, Darlin Coco MD 09/05/2015 1:07 PM    [address], [place], [place]  [address] Phone: [phone]; Fax: [phone]

## 2015-09-05 NOTE — Patient Instructions (Signed)
Medication Instructions:  Your physician recommends that you continue on your current medications as directed. Please refer to the Current Medication list given to you today.  Labwork: NONE  Testing/Procedures: NONE  Follow-Up: Your physician wants you to follow-up in: 6 MONTH OV/EKG WITH DR NELSON  You will receive a reminder letter in the mail two months in advance. If you don't receive a letter, please call our office to schedule the follow-up appointment.  If you need a refill on your cardiac medications before your next appointment, please call your pharmacy.  

## 2015-09-05 NOTE — Telephone Encounter (Signed)
Left message to call back  

## 2015-09-13 NOTE — Telephone Encounter (Signed)
Spoke with [facility] and gave them Dr Marcell Anger name as her cardiologist when  Dr. Wendall Papa

## 2015-09-18 ENCOUNTER — Encounter: Payer: Self-pay | Admitting: Internal Medicine

## 2015-10-02 ENCOUNTER — Non-Acute Institutional Stay: Payer: Medicare Other | Admitting: Internal Medicine

## 2015-10-02 ENCOUNTER — Encounter: Payer: Self-pay | Admitting: Internal Medicine

## 2015-10-02 DIAGNOSIS — I2510 Atherosclerotic heart disease of native coronary artery without angina pectoris: Secondary | ICD-10-CM | POA: Diagnosis not present

## 2015-10-02 DIAGNOSIS — Z789 Other specified health status: Secondary | ICD-10-CM

## 2015-10-02 DIAGNOSIS — F0280 Dementia in other diseases classified elsewhere without behavioral disturbance: Secondary | ICD-10-CM

## 2015-10-02 DIAGNOSIS — I350 Nonrheumatic aortic (valve) stenosis: Secondary | ICD-10-CM

## 2015-10-02 DIAGNOSIS — E039 Hypothyroidism, unspecified: Secondary | ICD-10-CM | POA: Diagnosis not present

## 2015-10-02 DIAGNOSIS — I2583 Coronary atherosclerosis due to lipid rich plaque: Secondary | ICD-10-CM

## 2015-10-02 DIAGNOSIS — G301 Alzheimer's disease with late onset: Secondary | ICD-10-CM | POA: Diagnosis not present

## 2015-10-02 DIAGNOSIS — M810 Age-related osteoporosis without current pathological fracture: Secondary | ICD-10-CM

## 2015-10-02 NOTE — Progress Notes (Signed)
Patient ID: Kendra Tapia, female   DOB: 09/30/1912, 81 y.o.   MRN: [id number]  Location:  [facility] Provider:  Santino Kinsella L. Malijah Lietz, D.O., C.M.D. Hollace Kinnier, DO  Code Status:  DNR Goals of care: Advanced Directive information Does patient have an advance directive?: Yes, Type of Advance Directive: [place];Living will;Out of facility DNR (pink MOST or yellow form), Pre-existing out of facility DNR order (yellow form or pink MOST form): Yellow form placed in chart (order not valid for inpatient use)  Chief Complaint  Patient presents with  . Medical Management of Chronic Issues    HPI:  Pt is a 81 y.o. white female seen today for medical management of chronic diseases.  She has been doing fairly well.  Today, she reports chronic headaches--says she has them every day and has one right now.  Staff report she does not complain of this regularly.  Her last labs were 08/28/15.  Her dementia is progressing and she often is seen with a doll she is looking after.  Her blood pressures have been under good control.  She has overactive bladder on myrbetriq.  Her tsh has been stable on synthroid.  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  Review of Systems  Constitutional: Positive for weight loss. Negative for fever and chills.  Eyes: Positive for blurred vision.  Respiratory: Negative for shortness of breath.   Cardiovascular: Negative for chest pain and palpitations.  Gastrointestinal: Negative for abdominal pain, constipation, blood in stool and melena.  Genitourinary: Positive for urgency and frequency. Negative for dysuria.  Musculoskeletal: Positive for joint pain and falls.  Neurological: Positive for headaches. Negative for dizziness.  Psychiatric/Behavioral: Positive for memory loss.    Past Medical History  Diagnosis Date  . IHD (ischemic heart disease)   . Nausea   . Coronary artery disease   . Hypothyroidism   .  Hyperlipidemia   . Dizziness   . Exudative senile macular degeneration of retina ([place]) 07/28/2012  . Tear film insufficiency, unspecified 07/28/2012  . Dermatophytosis of foot 05/10/2012  . Unspecified constipation 05/10/2012  . Unspecified urinary incontinence 05/10/2012  . Urethrocele(618.03) 02/11/2012  . Female stress incontinence 02/11/2012  . Depressive disorder, not elsewhere classified 11/10/2011  . Other malaise and fatigue 11/10/2011  . Loss of weight 11/10/2011  . Macular degeneration (senile) of retina, unspecified 09/24/2011  . Unspecified disorder of kidney and ureter 09/24/2011  . Pathologic fracture of vertebrae ([place]) 09/24/2011  . Memory loss 09/24/2011  . Abnormality of gait 09/24/2011  . Nocturia 09/24/2011  . Malignant neoplasm of breast (female), unspecified site 08/13/2011  . Hyposmolality and/or hyponatremia 08/13/2011  . Anemia, unspecified 08/13/2011  . Closed fracture of unspecified part of vertebral column without mention of spinal cord injury 08/13/2011  . Personal history of fall 08/13/2011  . Hypertension   . Memory deficit 03/02/2011     2012: HAs STMl, repeats herself, some difficulkty with sequence of events. Functional status, language skills are  intact.   Past Surgical History  Procedure Laterality Date  . Coronary angioplasty  03/23/94    NORMAL LEFT VENTRICULAR SYSTOLIC FUNCTION, EF XX123456  . Av fistula repair    . Hip surgery Left 10/07/2010    Dr. Rodell Perna    . Coronary angioplasty with stent placement      RIGHT CORONARY ARTERY  . Mastectomy Left     left     Allergies  Allergen Reactions  . Lipitor [  Atorvastatin Calcium]     MUSCLE ACHES  . Micardis [Telmisartan]   . Other     Adhesive tape,chocolate,citrus,berriespaper tape      Medication List       This list is accurate as of: 10/02/15 11:59 PM.  Always use your most recent med list.               acetaminophen 325 MG tablet  Commonly known as:  TYLENOL  Take 325 mg by  mouth. Take 2 tablets once a day     amLODipine 10 MG tablet  Commonly known as:  NORVASC  Take 10 mg by mouth daily.     calcitonin (salmon) 200 UNIT/ACT nasal spray  Commonly known as:  MIACALCIN/FORTICAL  Place 1 spray into the nose daily. Alternate nostrils     cloNIDine 0.1 MG tablet  Commonly known as:  CATAPRES  Take 0.1 mg by mouth daily as needed. Take 0.1mg  by mouth as need for systolic blood 99991111     hydroxypropyl methylcellulose / hypromellose 2.5 % ophthalmic solution  Commonly known as:  ISOPTO TEARS / GONIOVISC  Place 1 drop into both eyes every 4 (four) hours as needed for dry eyes (dry eye).     lactose free nutrition Liqd  Take 237 mLs by mouth 2 (two) times daily between meals.     levothyroxine 25 MCG tablet  Commonly known as:  SYNTHROID, LEVOTHROID  Take 25 mcg by mouth daily before breakfast.     losartan 50 MG tablet  Commonly known as:  COZAAR  Take 1 tablet (50 mg total) by mouth daily.     metoprolol 50 MG tablet  Commonly known as:  LOPRESSOR  Take 50 mg by mouth 2 (two) times daily.     mirabegron ER 50 MG Tb24 tablet  Commonly known as:  MYRBETRIQ  Take 50 mg by mouth daily.     mirtazapine 15 MG tablet  Commonly known as:  REMERON  Take 7.5 mg by mouth daily.     NITROSTAT 0.4 MG SL tablet  Generic drug:  nitroGLYCERIN  Place 0.4 mg under the tongue as needed. Place 1 tablet under the tongue every 5 minutes as needed for chest pain up to 3 doses     Polyethyl Glycol-Propyl Glycol 0.4-0.3 % Soln  Apply 1 drop to eye 4 (four) times daily - after meals and at bedtime.     polyethylene glycol packet  Commonly known as:  MIRALAX / GLYCOLAX  Take 17 g by mouth daily.     PRESERVISION AREDS PO  Take 1 capsule by mouth. Twice a day     sennosides-docusate sodium 8.6-50 MG tablet  Commonly known as:  SENOKOT-S  Take 2 tablets by mouth at bedtime.     tobramycin 0.3 % ophthalmic solution  Commonly known as:  TOBREX  Place 1  drop into the right eye every 6 (six) hours. Place 1 drop into the right eye every 6 hours 1 day prior to appoint        Immunization History  Administered Date(s) Administered  . Influenza Whole 07/13/2012, 07/27/2013  . Influenza-Unspecified 07/19/2014, 07/26/2015  . PPD Test 02/03/2012  . Pneumococcal Polysaccharide-23 10/13/2010   Pertinent  Health Maintenance Due  Topic Date Due  . DEXA SCAN  08/07/1979  . PNA vac Low Risk Adult (2 of 2 - PCV13) 10/14/2011  . INFLUENZA VACCINE  05/13/2016   Fall Risk  07/26/2015 03/01/2014 02/23/2013  Falls in the past year?  Yes Yes Yes  Number falls in past yr: 1 1 2  or more  Injury with Fall? - No -  Risk for fall due to : History of fall(s) History of fall(s);Impaired balance/gait Impaired balance/gait;Impaired mobility  Follow up Falls evaluation completed - -    Filed Vitals:   10/02/15 1601  BP: 110/56  Pulse: 62  Temp: 96.6 F (35.9 C)  Resp: 16  Height: 5\' 2"  (1.575 m)  Weight: 109 lb 8 oz (49.669 kg)   Body mass index is 20.02 kg/(m^2). Physical Exam  Constitutional: No distress.  Cardiovascular: Normal rate and regular rhythm.   Murmur heard. Pulmonary/Chest: Effort normal and breath sounds normal. She has no rales.  Abdominal: Soft. Bowel sounds are normal.  Musculoskeletal: Normal range of motion.  Neurological: She is alert.  Psychiatric: She has a normal mood and affect.  Holding doll and "comforting it"    Labs reviewed:  Recent Labs  11/27/14 0235  12/04/14 03/27/15 08/28/15  NA 127*  < > 135* 133* 134*  K 4.8  < > 4.9 4.6 5.0  CL 100  --   --   --   --   CO2 20  --   --   --   --   GLUCOSE 140*  --   --   --   --   BUN 26*  < > 28* 33* 29*  CREATININE 1.07  < > 1.1 1.1 1.1  CALCIUM 8.5  --   --   --   --   < > = values in this interval not displayed.  Recent Labs  11/27/14 0235 03/27/15  AST 29 15  ALT 15 11  ALKPHOS 76 71  BILITOT 0.7  --   PROT 6.3  --   ALBUMIN 3.8  --     Recent Labs   11/27/14 0235  01/11/15 03/27/15 08/28/15  WBC 15.6*  < > 5.1 5.9 5.1  NEUTROABS 14.5*  --   --   --   --   HGB 9.1*  < > 8.8* 10.9* 10.6*  HCT 27.5*  < > 27* 32* 31*  MCV 93.9  --   --   --   --   PLT 184  < > 241 227 217  < > = values in this interval not displayed. Lab Results  Component Value Date   TSH 3.84 03/27/2015   No results found for: HGBA1C Lab Results  Component Value Date   CHOL 174 03/13/2011   HDL 65.50 03/13/2011   LDLCALC 95 03/13/2011   TRIG 67.0 03/13/2011   CHOLHDL 3 03/13/2011    Assessment/Plan 1. Aortic stenosis--severe Occasional chest pain, but has not required any ntg No syncope, no dyspnea Follows with Dr. Mare Ferrari  2. Late onset Alzheimer's disease without behavioral disturbance -cont AL memory care unit care -not on memory meds and unlikely to benefit at this stage, pleasant lady, redirectable  3. Coronary artery disease due to lipid rich plaque -stable w/o ntg use -continues on bp control--off statins due to intolerance, not on asa  4. Hypothyroidism, unspecified hypothyroidism type -cont current synthroid -last tsh in June normal  5. Osteoporosis, senile -plan to stop salmon calcitonin for compression fx--probably not doing much good since she is no longer on calcium or vitamin D  6. Active advance directive - added again to chart - DNR (Do Not Resuscitate)   Family/ staff Communication: discussed with [employer] nursing  Labs/tests ordered:  No new (just had last  month) Fitzpatrick Alberico L. Ammie Warrick, D.O. [facility] [address], [place] [address] Cell Phone (Mon-Fri 8am-5pm):  [phone] On Call:  [phone] & follow prompts after 5pm & weekends Office Phone:  [phone] Office Fax:  [phone]

## 2015-12-06 ENCOUNTER — Encounter: Payer: Self-pay | Admitting: Adult Health

## 2015-12-06 ENCOUNTER — Non-Acute Institutional Stay: Payer: Medicare Other | Admitting: Adult Health

## 2015-12-06 DIAGNOSIS — G309 Alzheimer's disease, unspecified: Secondary | ICD-10-CM

## 2015-12-06 DIAGNOSIS — I119 Hypertensive heart disease without heart failure: Secondary | ICD-10-CM

## 2015-12-06 DIAGNOSIS — R634 Abnormal weight loss: Secondary | ICD-10-CM | POA: Diagnosis not present

## 2015-12-06 DIAGNOSIS — I350 Nonrheumatic aortic (valve) stenosis: Secondary | ICD-10-CM | POA: Diagnosis not present

## 2015-12-06 DIAGNOSIS — F0280 Dementia in other diseases classified elsewhere without behavioral disturbance: Secondary | ICD-10-CM

## 2015-12-06 DIAGNOSIS — K5901 Slow transit constipation: Secondary | ICD-10-CM | POA: Diagnosis not present

## 2015-12-06 NOTE — Progress Notes (Signed)
Patient ID: Kendra Tapia, female   DOB: [birth year], 82 y.o.   MRN: [id number]  Location:  [facility]  [facility] (13) Provider:  Cindi Carbon, ANP    Patient Care Team: Gayland Curry, DO as PCP - General (Geriatric Medicine) [facility] Darlin Coco, MD as Consulting Physician (Cardiology)  Extended Emergency Contact Information Primary Emergency Contact: [name],[name] Address: [place], [place] Phone: [phone] Relation: Relative  Code Status:  DNR Goals of care: Advanced Directive information Advanced Directives 10/02/2015  Does patient have an advance directive? Yes  Type of Paramedic of [place];Living will;Out of facility DNR (pink MOST or yellow form)  Copy of advanced directive(s) in chart? -  Pre-existing out of facility DNR order (yellow form or pink MOST form) Yellow form placed in chart (order not valid for inpatient use)     Chief Complaint  Patient presents with  . Medical Management of Chronic Issues    HPI:  Pt is a 82 y.o. female seen today for medical management of chronic diseases.    1. Benign hypertensive heart disease without heart failure -BP controlled with norvasc, no prn clonidine needed -denies sob, cp, no edema  2. Aortic stenosis -significant, no intervention due to age and debility -fatigues in the afternoon, stand pivot transfer for mobility  3. Slow transit constipation -no reports of constipation on miralax and senokot  4. Alzheimer's dementia without behavioral disturbance, unspecified timing of dementia onset -advanced -not on meds due to age and debility -needs assistance for all ADLs -holds baby for comfort -MMSE 2/30  5. Weight loss, unintentional -weight has stabilized at 108 lbs with remeron  Past Medical History  Diagnosis Date  . IHD (ischemic heart disease)   .  Nausea   . Coronary artery disease   . Hypothyroidism   . Hyperlipidemia   . Dizziness   . Exudative senile macular degeneration of retina ([place]) 07/28/2012  . Tear film insufficiency, unspecified 07/28/2012  . Dermatophytosis of foot 05/10/2012  . Unspecified constipation 05/10/2012  . Unspecified urinary incontinence 05/10/2012  . Urethrocele(618.03) 02/11/2012  . Female stress incontinence 02/11/2012  . Depressive disorder, not elsewhere classified 11/10/2011  . Other malaise and fatigue 11/10/2011  . Loss of weight 11/10/2011  . Macular degeneration (senile) of retina, unspecified 09/24/2011  . Unspecified disorder of kidney and ureter 09/24/2011  . Pathologic fracture of vertebrae ([place]) 09/24/2011  . Memory loss 09/24/2011  . Abnormality of gait 09/24/2011  . Nocturia 09/24/2011  . Malignant neoplasm of breast (female), unspecified site 08/13/2011  . Hyposmolality and/or hyponatremia 08/13/2011  . Anemia, unspecified 08/13/2011  . Closed fracture of unspecified part of vertebral column without mention of spinal cord injury 08/13/2011  . Personal history of fall 08/13/2011  . Hypertension   . Memory deficit 03/02/2011     2012: HAs STMl, repeats herself, some difficulkty with sequence of events. Functional status, language skills are  intact.   Past Surgical History  Procedure Laterality Date  . Coronary angioplasty  03/23/94    NORMAL LEFT VENTRICULAR SYSTOLIC FUNCTION, EF XX123456  . Av fistula repair    . Hip surgery Left 10/07/2010    Dr. Rodell Perna    . Coronary angioplasty with stent placement      RIGHT CORONARY ARTERY  . Mastectomy Left     left     Allergies  Allergen Reactions  . Lipitor [Atorvastatin Calcium]     MUSCLE ACHES  . Micardis [Telmisartan]   . Other     Adhesive tape,chocolate,citrus,berriespaper tape      Medication List       This list is accurate as of: 12/06/15  2:38 PM.  Always use your most recent med list.               acetaminophen 325 MG  tablet  Commonly known as:  TYLENOL  Take 325 mg by mouth. Take 2 tablets once a day     amLODipine 10 MG tablet  Commonly known as:  NORVASC  Take 10 mg by mouth daily.     calcitonin (salmon) 200 UNIT/ACT nasal spray  Commonly known as:  MIACALCIN/FORTICAL  Place 1 spray into the nose daily. Alternate nostrils     cloNIDine 0.1 MG tablet  Commonly known as:  CATAPRES  Take 0.1 mg by mouth daily as needed. Take 0.1mg  by mouth as need for systolic blood 99991111     hydroxypropyl methylcellulose / hypromellose 2.5 % ophthalmic solution  Commonly known as:  ISOPTO TEARS / GONIOVISC  Place 1 drop into both eyes every 4 (four) hours as needed for dry eyes (dry eye).     lactose free nutrition Liqd  Take 237 mLs by mouth 2 (two) times daily between meals.     levothyroxine 25 MCG tablet  Commonly known as:  SYNTHROID, LEVOTHROID  Take 25 mcg by mouth daily before breakfast.     losartan 50 MG tablet  Commonly known as:  COZAAR  Take 1 tablet (50 mg total) by mouth daily.     metoprolol 50 MG tablet  Commonly known as:  LOPRESSOR  Take 50 mg by mouth 2 (two) times daily.     mirabegron ER 50 MG Tb24 tablet  Commonly known as:  MYRBETRIQ  Take 50 mg by mouth daily.     mirtazapine 15 MG tablet  Commonly known as:  REMERON  Take 7.5 mg by mouth daily.     NITROSTAT 0.4 MG SL tablet  Generic drug:  nitroGLYCERIN  Place 0.4 mg under the tongue as needed. Place 1 tablet under the tongue every 5 minutes as needed for chest pain up to 3 doses     Polyethyl Glycol-Propyl Glycol 0.4-0.3 % Soln  Apply 1 drop to eye 4 (four) times daily - after meals and at bedtime.     polyethylene glycol packet  Commonly known as:  MIRALAX / GLYCOLAX  Take 17 g by mouth daily.     PRESERVISION AREDS PO  Take 1 capsule by mouth. Twice a day     sennosides-docusate sodium 8.6-50 MG tablet  Commonly known as:  SENOKOT-S  Take 2 tablets by mouth at bedtime.     tobramycin 0.3 %  ophthalmic solution  Commonly known as:  TOBREX  Place 1 drop into the right eye every 6 (six) hours. Place 1 drop into the right eye every 6 hours 1 day prior to appoint        Review of Systems  Unable to perform ROS: Dementia  Constitutional: Negative for fever, chills, diaphoresis, activity change, appetite change, fatigue and unexpected weight change.  HENT: Negative for congestion.   Respiratory: Negative for cough and shortness of breath.   Cardiovascular: Negative for chest pain.  Gastrointestinal: Negative for diarrhea and constipation.  Genitourinary: Negative for dysuria and difficulty urinating.  Musculoskeletal: Positive for arthralgias and gait problem. Negative  for back pain.  Neurological: Negative for dizziness and facial asymmetry.  Psychiatric/Behavioral: Positive for confusion. Negative for behavioral problems and agitation.    Immunization History  Administered Date(s) Administered  . Influenza Whole 07/13/2012, 07/27/2013  . Influenza-Unspecified 07/19/2014, 07/26/2015  . PPD Test 02/03/2012  . Pneumococcal Polysaccharide-23 10/13/2010   Pertinent  Health Maintenance Due  Topic Date Due  . DEXA SCAN  08/07/1979  . PNA vac Low Risk Adult (2 of 2 - PCV13) 10/14/2011  . INFLUENZA VACCINE  05/13/2016   Fall Risk  07/26/2015 03/01/2014 02/23/2013  Falls in the past year? Yes Yes Yes  Number falls in past yr: 1 1 2  or more  Injury with Fall? - No -  Risk for fall due to : History of fall(s) History of fall(s);Impaired balance/gait Impaired balance/gait;Impaired mobility  Follow up Falls evaluation completed - -   Functional Status Survey:   Wt Readings from Last 3 Encounters:  12/06/15 108 lb (48.988 kg)  10/02/15 109 lb 8 oz (49.669 kg)  07/26/15 110 lb (49.896 kg)    Filed Vitals:   12/06/15 1429  Weight: 108 lb (48.988 kg)   Body mass index is 19.75 kg/(m^2). Physical Exam  Constitutional: No distress.  HENT:  Head: Normocephalic and  atraumatic.  Neck: No JVD present.  Cardiovascular: Normal rate and regular rhythm.   Murmur (3/6 systolic) heard. No edema  Pulmonary/Chest: Effort normal. She has wheezes (occasional bilateral, other wise clear).  Abdominal: Soft. Bowel sounds are normal. She exhibits no distension.  Neurological: She is alert.  Oriented x1, f/c, difficulty following commands  Skin: Skin is warm and dry. She is not diaphoretic.  Psychiatric: She has a normal mood and affect.  Nursing note and vitals reviewed.   Labs reviewed:  Recent Labs  03/27/15 08/28/15  NA 133* 134*  K 4.6 5.0  BUN 33* 29*  CREATININE 1.1 1.1    Recent Labs  03/27/15  AST 15  ALT 11  ALKPHOS 71    Recent Labs  01/11/15 03/27/15 08/28/15  WBC 5.1 5.9 5.1  HGB 8.8* 10.9* 10.6*  HCT 27* 32* 31*  PLT 241 227 217   Lab Results  Component Value Date   TSH 3.84 03/27/2015   No results found for: HGBA1C Lab Results  Component Value Date   CHOL 174 03/13/2011   HDL 65.50 03/13/2011   LDLCALC 95 03/13/2011   TRIG 67.0 03/13/2011   CHOLHDL 3 03/13/2011    Significant Diagnostic Results in last 30 days:  No results found.  Assessment/Plan  1. Benign hypertensive heart disease without heart failure -no reports of angina/chf -BP controlled, continue current meds  2. Aortic stenosis -continue to monitor, followed by Dr. Mare Ferrari  3. Slow transit constipation -controlled -continue miralax and senokot  4. Alzheimer's dementia without behavioral disturbance, unspecified timing of dementia onset -advanced -supportive care only, redirects easily  5. Weight loss, unintentional -continue remeron   Family/ staff Communication: discussed with nurse  Labs/tests ordered:  none  Cindi Carbon, [place] [phone]

## 2016-01-31 ENCOUNTER — Non-Acute Institutional Stay: Payer: Medicare Other | Admitting: Adult Health

## 2016-01-31 DIAGNOSIS — N183 Chronic kidney disease, stage 3 unspecified: Secondary | ICD-10-CM

## 2016-01-31 DIAGNOSIS — R351 Nocturia: Secondary | ICD-10-CM

## 2016-01-31 DIAGNOSIS — F0280 Dementia in other diseases classified elsewhere without behavioral disturbance: Secondary | ICD-10-CM | POA: Diagnosis not present

## 2016-01-31 DIAGNOSIS — D6489 Other specified anemias: Secondary | ICD-10-CM

## 2016-01-31 DIAGNOSIS — R634 Abnormal weight loss: Secondary | ICD-10-CM

## 2016-01-31 DIAGNOSIS — I350 Nonrheumatic aortic (valve) stenosis: Secondary | ICD-10-CM

## 2016-01-31 DIAGNOSIS — I10 Essential (primary) hypertension: Secondary | ICD-10-CM

## 2016-01-31 DIAGNOSIS — G309 Alzheimer's disease, unspecified: Secondary | ICD-10-CM

## 2016-01-31 NOTE — Progress Notes (Signed)
Patient ID: Kendra Tapia, female   DOB: [birth year], 82 y.o.   MRN: [id number]  Location:  [place]:  ALF (13) Provider:  Cindi Carbon, ANP  Patient Care Team: Gayland Curry, DO as PCP - General (Geriatric Medicine) [facility] Darlin Coco, MD as Consulting Physician (Cardiology)  Extended Emergency Contact Information Primary Emergency Contact: [name],[name] Address: [place], [place] Phone: [phone] Relation: Relative  Code Status:  DNR Goals of care: Advanced Directive information Advanced Directives 10/02/2015  Does patient have an advance directive? Yes  Type of Paramedic of [place];Living will;Out of facility DNR (pink MOST or yellow form)  Copy of advanced directive(s) in chart? -  Pre-existing out of facility DNR order (yellow form or pink MOST form) Yellow form placed in chart (order not valid for inpatient use)     Chief Complaint  Patient presents with  . Medical Management of Chronic Issues    HPI:  Pt is a 82 y.o. female seen today for medical management of chronic diseases.    1. Weight loss, unintentional Wt Readings from Last 3 Encounters:  01/31/16 108 lb (48.988 kg)  12/06/15 108 lb (48.988 kg)  10/02/15 109 lb 8 oz (49.669 kg)  -currently on remeron for appetite  2. Nocturia -denies urinary frequency at night -on myrbetriq  3. Anemia due to other cause Lab Results  Component Value Date   HGB 10.6* 08/28/2015  -stable, off iron   4. Alzheimer's dementia without behavioral disturbance, unspecified timing of dementia onset -advanced -non ambulatory, requires assistance for ADLs except feeding  5. CKD (chronic kidney disease), stage III Lab Results  Component Value Date   BUN 29* 08/28/2015   Lab Results  Component Value Date   CREATININE 1.1 08/28/2015    6. Essential  hypertension -BP controlled with occasional lows  -currently on norvasc, metoprolol and cozaar  7. Aortic stenosis -noted with 3/6 murmur radiating to the neck -previously followed by Dr. Mare Ferrari indicating only medical management -no cp or sob, has DOE at baseline  Past Medical History  Diagnosis Date  . IHD (ischemic heart disease)   . Nausea   . Coronary artery disease   . Hypothyroidism   . Hyperlipidemia   . Dizziness   . Exudative senile macular degeneration of retina ([place]) 07/28/2012  . Tear film insufficiency, unspecified 07/28/2012  . Dermatophytosis of foot 05/10/2012  . Unspecified constipation 05/10/2012  . Unspecified urinary incontinence 05/10/2012  . Urethrocele(618.03) 02/11/2012  . Female stress incontinence 02/11/2012  . Depressive disorder, not elsewhere classified 11/10/2011  . Other malaise and fatigue 11/10/2011  . Loss of weight 11/10/2011  . Macular degeneration (senile) of retina, unspecified 09/24/2011  . Unspecified disorder of kidney and ureter 09/24/2011  . Pathologic fracture of vertebrae ([place]) 09/24/2011  . Memory loss 09/24/2011  . Abnormality of gait 09/24/2011  . Nocturia 09/24/2011  . Malignant neoplasm of breast (female), unspecified site 08/13/2011  . Hyposmolality and/or hyponatremia 08/13/2011  . Anemia, unspecified 08/13/2011  . Closed fracture of unspecified part of vertebral column without mention of spinal cord injury 08/13/2011  . Personal history of fall 08/13/2011  . Hypertension   . Memory deficit 03/02/2011     2012: HAs STMl, repeats herself, some difficulkty with sequence of events. Functional status, language skills are  intact.   Past Surgical History  Procedure Laterality  Date  . Coronary angioplasty  03/23/94    NORMAL LEFT VENTRICULAR SYSTOLIC FUNCTION, EF XX123456  . Av fistula repair    . Hip surgery Left 10/07/2010    Dr. Rodell Perna    . Coronary angioplasty with stent placement      RIGHT CORONARY ARTERY  . Mastectomy Left      left     Allergies  Allergen Reactions  . Lipitor [Atorvastatin Calcium]     MUSCLE ACHES  . Micardis [Telmisartan]   . Other     Adhesive tape,chocolate,citrus,berriespaper tape      Medication List       This list is accurate as of: 01/31/16 12:18 PM.  Always use your most recent med list.               acetaminophen 325 MG tablet  Commonly known as:  TYLENOL  Take 325 mg by mouth. Take 2 tablets once a day     amLODipine 10 MG tablet  Commonly known as:  NORVASC  Take 10 mg by mouth daily.     cloNIDine 0.1 MG tablet  Commonly known as:  CATAPRES  Take 0.1 mg by mouth daily as needed. Take 0.1mg  by mouth as need for systolic blood 99991111     hydroxypropyl methylcellulose / hypromellose 2.5 % ophthalmic solution  Commonly known as:  ISOPTO TEARS / GONIOVISC  Place 1 drop into both eyes every 4 (four) hours as needed for dry eyes (dry eye).     lactose free nutrition Liqd  Take 237 mLs by mouth 2 (two) times daily between meals.     levothyroxine 25 MCG tablet  Commonly known as:  SYNTHROID, LEVOTHROID  Take 25 mcg by mouth daily before breakfast.     losartan 50 MG tablet  Commonly known as:  COZAAR  Take 1 tablet (50 mg total) by mouth daily.     metoprolol 50 MG tablet  Commonly known as:  LOPRESSOR  Take 50 mg by mouth 2 (two) times daily.     mirabegron ER 50 MG Tb24 tablet  Commonly known as:  MYRBETRIQ  Take 50 mg by mouth daily.     mirtazapine 15 MG tablet  Commonly known as:  REMERON  Take 7.5 mg by mouth daily.     NITROSTAT 0.4 MG SL tablet  Generic drug:  nitroGLYCERIN  Place 0.4 mg under the tongue as needed. Place 1 tablet under the tongue every 5 minutes as needed for chest pain up to 3 doses     Polyethyl Glycol-Propyl Glycol 0.4-0.3 % Soln  Apply 1 drop to eye 4 (four) times daily - after meals and at bedtime.     polyethylene glycol packet  Commonly known as:  MIRALAX / GLYCOLAX  Take 17 g by mouth daily.      PRESERVISION AREDS PO  Take 1 capsule by mouth. Twice a day     sennosides-docusate sodium 8.6-50 MG tablet  Commonly known as:  SENOKOT-S  Take 2 tablets by mouth at bedtime.        Review of Systems  Unable to perform ROS: Dementia  Constitutional: Negative for fever, chills, diaphoresis, activity change, appetite change, fatigue and unexpected weight change.  HENT: Negative for congestion.   Respiratory: Negative for cough and shortness of breath.   Cardiovascular: Negative for chest pain.  Gastrointestinal: Negative for diarrhea and constipation.  Genitourinary: Negative for dysuria and difficulty urinating.  Musculoskeletal: Positive for arthralgias and gait problem. Negative for  back pain.  Neurological: Negative for dizziness and facial asymmetry.  Psychiatric/Behavioral: Positive for confusion. Negative for behavioral problems and agitation.    Immunization History  Administered Date(s) Administered  . Influenza Whole 07/13/2012, 07/27/2013  . Influenza-Unspecified 07/19/2014, 07/26/2015  . PPD Test 02/03/2012  . Pneumococcal Polysaccharide-23 10/13/2010   Pertinent  Health Maintenance Due  Topic Date Due  . DEXA SCAN  08/07/1979  . PNA vac Low Risk Adult (2 of 2 - PCV13) 10/14/2011  . INFLUENZA VACCINE  05/13/2016   Fall Risk  07/26/2015 03/01/2014 02/23/2013  Falls in the past year? Yes Yes Yes  Number falls in past yr: 1 1 2  or more  Injury with Fall? - No -  Risk for fall due to : History of fall(s) History of fall(s);Impaired balance/gait Impaired balance/gait;Impaired mobility  Follow up Falls evaluation completed - -   Functional Status Survey:   Wt Readings from Last 3 Encounters:  01/31/16 108 lb (48.988 kg)  12/06/15 108 lb (48.988 kg)  10/02/15 109 lb 8 oz (49.669 kg)   Wt Readings from Last 3 Encounters:  01/31/16 108 lb (48.988 kg)  12/06/15 108 lb (48.988 kg)  10/02/15 109 lb 8 oz (49.669 kg)    Filed Vitals:   01/31/16 1209  Weight: 108  lb (48.988 kg)   Body mass index is 19.75 kg/(m^2). Physical Exam  Constitutional: No distress.  HENT:  Head: Normocephalic and atraumatic.  Neck: No JVD present.  Cardiovascular: Normal rate and regular rhythm.   Murmur (3/6 systolic) heard. No edema  Pulmonary/Chest: Effort normal. She has wheezes (occasional bilateral, other wise clear).  Abdominal: Soft. Bowel sounds are normal. She exhibits no distension.  Neurological: She is alert.  Oriented x1, f/c, difficulty following commands  Skin: Skin is warm and dry. She is not diaphoretic.  Psychiatric: She has a normal mood and affect.  Nursing note and vitals reviewed.   Labs reviewed:  Recent Labs  03/27/15 08/28/15  NA 133* 134*  K 4.6 5.0  BUN 33* 29*  CREATININE 1.1 1.1    Recent Labs  03/27/15  AST 15  ALT 11  ALKPHOS 71    Recent Labs  03/27/15 08/28/15  WBC 5.9 5.1  HGB 10.9* 10.6*  HCT 32* 31*  PLT 227 217   Lab Results  Component Value Date   TSH 3.84 03/27/2015   No results found for: HGBA1C Lab Results  Component Value Date   CHOL 174 03/13/2011   HDL 65.50 03/13/2011   LDLCALC 95 03/13/2011   TRIG 67.0 03/13/2011   CHOLHDL 3 03/13/2011    Significant Diagnostic Results in last 30 days:  No results found.  Assessment/Plan  1. Weight loss, unintentional -weight loss has stabilized  -continue remeron  2. Nocturia -stable -continue myrbetriq  3. Anemia due to other cause -stable H/H off iron  4. Alzheimer's dementia without behavioral disturbance, unspecified timing of dementia onset -advanced, non ambulatory  -no meds due to age and lack of benefit -doing well in the memory care unit  5. CKD (chronic kidney disease), stage III -stable -[id number] -avoid nephrotoxic drugs  6. Essential hypertension -mostly controlled with occasional low -hold lopressor for HR <60, SBP<100, hold cozaar and norvasc for SBP<100 to reduce falls  7. Aortic stenosis -medical management, has  some DOE but otherwise asymptomatic -not a surgical candidate -continue current meds    Family/ staff Communication: discussed with nurse  Labs/tests ordered:  None, next visit would check CBC and BMP  Alyse Low  Melvyn Novas, ANP [facility] [phone]

## 2016-02-26 ENCOUNTER — Encounter: Payer: Self-pay | Admitting: Internal Medicine

## 2016-02-26 ENCOUNTER — Non-Acute Institutional Stay: Payer: Medicare Other | Admitting: Internal Medicine

## 2016-02-26 DIAGNOSIS — I350 Nonrheumatic aortic (valve) stenosis: Secondary | ICD-10-CM | POA: Diagnosis not present

## 2016-02-26 DIAGNOSIS — G309 Alzheimer's disease, unspecified: Secondary | ICD-10-CM

## 2016-02-26 DIAGNOSIS — R634 Abnormal weight loss: Secondary | ICD-10-CM | POA: Diagnosis not present

## 2016-02-26 DIAGNOSIS — I119 Hypertensive heart disease without heart failure: Secondary | ICD-10-CM | POA: Diagnosis not present

## 2016-02-26 DIAGNOSIS — F0280 Dementia in other diseases classified elsewhere without behavioral disturbance: Secondary | ICD-10-CM

## 2016-02-26 DIAGNOSIS — N3281 Overactive bladder: Secondary | ICD-10-CM | POA: Diagnosis not present

## 2016-02-26 DIAGNOSIS — H3530 Unspecified macular degeneration: Secondary | ICD-10-CM | POA: Diagnosis not present

## 2016-02-26 DIAGNOSIS — N183 Chronic kidney disease, stage 3 unspecified: Secondary | ICD-10-CM

## 2016-02-26 NOTE — Progress Notes (Signed)
Patient ID: Kendra Tapia, female   DOB: 19-Aug-1912, 82 y.o.   MRN: [id number]  Location:  [place] Room Number: [id number] Place of Service:  ALF (13) Provider:  Dain Laseter L. Mariea Clonts, D.O., C.M.D.  Hollace Kinnier, DO  Patient Care Team: Gayland Curry, DO as PCP - General (Geriatric Medicine) [facility] Darlin Coco, MD as Consulting Physician (Cardiology)  Extended Emergency Contact Information Primary Emergency Contact: [name],[name] Address: [place], [place] Phone: [phone] Relation: Relative  Code Status:  DNR Goals of care: Advanced Directive information Advanced Directives 02/26/2016  Does patient have an advance directive? Yes  Type of Advance Directive Out of facility DNR (pink MOST or yellow form);Healthcare Power of Attorney  Copy of advanced directive(s) in chart? Yes  Pre-existing out of facility DNR order (yellow form or pink MOST form) Yellow form placed in chart (order not valid for inpatient use);Pink MOST form placed in chart (order not valid for inpatient use)   Chief Complaint  Patient presents with  . Medical Management of Chronic Issues    routine visit    HPI:  Pt is a 82 y.o. female seen today for medical management of chronic diseases.  She has advanced AD, CAD, aortic stenosis, HTN, CKDIII, constipation, urinary incontinence, OA bilateral hips, macular degeneration, hypothyroidism, hyperlipidemia, and failure to thrive at 82 years old.    When seen, her weight has trended back up 2 lbs from last month.  She is on remeron for her appetite, but still does not eat very much.  She continues to attend the activities in memory care.  She requires help with her adls except to feed herself.   She has no new symptoms from her AS without syncope, angina or any increase in dyspnea on exertion.    BPs reviewed and range from 100s to 150s max.  Has  a prn clonidine for bp over 99991111 which certainly has not been used in the past month.    Previously, bone density was recommended due to osteoporosis (has had several fractures with falls) but refused by family. She is 89.    Past Medical History  Diagnosis Date  . IHD (ischemic heart disease)   . Nausea   . Coronary artery disease   . Hypothyroidism   . Hyperlipidemia   . Dizziness   . Exudative senile macular degeneration of retina ([place]) 07/28/2012  . Tear film insufficiency, unspecified 07/28/2012  . Dermatophytosis of foot 05/10/2012  . Unspecified constipation 05/10/2012  . Unspecified urinary incontinence 05/10/2012  . Urethrocele(618.03) 02/11/2012  . Female stress incontinence 02/11/2012  . Depressive disorder, not elsewhere classified 11/10/2011  . Other malaise and fatigue 11/10/2011  . Loss of weight 11/10/2011  . Macular degeneration (senile) of retina, unspecified 09/24/2011  . Unspecified disorder of kidney and ureter 09/24/2011  . Pathologic fracture of vertebrae ([place]) 09/24/2011  . Memory loss 09/24/2011  . Abnormality of gait 09/24/2011  . Nocturia 09/24/2011  . Malignant neoplasm of breast (female), unspecified site 08/13/2011  . Hyposmolality and/or hyponatremia 08/13/2011  . Anemia, unspecified 08/13/2011  . Closed fracture of unspecified part of vertebral column without mention of spinal cord injury 08/13/2011  . Personal history of fall 08/13/2011  . Hypertension   . Memory deficit 03/02/2011     2012: HAs STMl, repeats herself, some difficulkty with sequence of events. Functional status, language skills are  intact.  Past Surgical History  Procedure Laterality Date  . Coronary angioplasty  03/23/94    NORMAL LEFT VENTRICULAR SYSTOLIC FUNCTION, EF [id number]  . Av fistula repair    . Hip surgery Left 10/07/2010    Dr. Rodell Perna    . Coronary angioplasty with stent placement      RIGHT CORONARY ARTERY  . Mastectomy Left     left     Allergies  Allergen Reactions   . Lipitor [Atorvastatin Calcium]     MUSCLE ACHES  . Micardis [Telmisartan]   . Other     Adhesive tape,chocolate,citrus,berriespaper tape      Medication List       This list is accurate as of: 02/26/16 11:59 PM.  Always use your most recent med list.               acetaminophen 325 MG tablet  Commonly known as:  TYLENOL  Take 325 mg by mouth. Take 2 tablets once a day     amLODipine 10 MG tablet  Commonly known as:  NORVASC  Take 10 mg by mouth daily.     chlorhexidine 0.12 % solution  Commonly known as:  PERIDEX  Brush on 1 tsp of solution to teeth and gums with a toothbrush after PM mouth care     cloNIDine 0.1 MG tablet  Commonly known as:  CATAPRES  Take 0.1 mg by mouth daily as needed. Take 0.1mg  by mouth as need for systolic blood 99991111     hydroxypropyl methylcellulose / hypromellose 2.5 % ophthalmic solution  Commonly known as:  ISOPTO TEARS / GONIOVISC  Place 1 drop into both eyes 6 (six) times daily.     lactose free nutrition Liqd  Take 237 mLs by mouth 2 (two) times daily between meals.     levothyroxine 25 MCG tablet  Commonly known as:  SYNTHROID, LEVOTHROID  Take 25 mcg by mouth daily before breakfast.     losartan 50 MG tablet  Commonly known as:  COZAAR  Take 1 tablet (50 mg total) by mouth daily.     metoprolol 50 MG tablet  Commonly known as:  LOPRESSOR  Take 50 mg by mouth 2 (two) times daily.     mirabegron ER 50 MG Tb24 tablet  Commonly known as:  MYRBETRIQ  Take 50 mg by mouth daily.     mirtazapine 15 MG tablet  Commonly known as:  REMERON  Take 7.5 mg by mouth daily.     NITROSTAT 0.4 MG SL tablet  Generic drug:  nitroGLYCERIN  Place 0.4 mg under the tongue as needed. Place 1 tablet under the tongue every 5 minutes as needed for chest pain up to 3 doses     Polyethyl Glycol-Propyl Glycol 0.4-0.3 % Soln  Apply 1 drop to eye 4 (four) times daily - after meals and at bedtime.     polyethylene glycol packet  Commonly  known as:  MIRALAX / GLYCOLAX  Take 17 g by mouth daily.     multivitamin-lutein Caps capsule  Take 1 capsule by mouth daily.     PRESERVISION AREDS PO  Take 1 capsule by mouth. Twice a day     sennosides-docusate sodium 8.6-50 MG tablet  Commonly known as:  SENOKOT-S  Take 2 tablets by mouth at bedtime.        Review of Systems  Constitutional: Negative for fever, chills, activity change, appetite change and unexpected weight change.  HENT: Negative for congestion.   Eyes: Positive for  visual disturbance.       Macular degeneration, wears glasses  Respiratory: Positive for shortness of breath. Negative for cough.        Chronic DOE  Cardiovascular: Negative for chest pain and leg swelling.  Gastrointestinal: Positive for constipation. Negative for nausea, vomiting, abdominal pain and blood in stool.  Genitourinary: Negative for dysuria, urgency and frequency.       Is incontinent, on myrbetriq  Neurological: Negative for dizziness and light-headedness.  Psychiatric/Behavioral: Positive for confusion.    Immunization History  Administered Date(s) Administered  . Influenza Whole 07/13/2012, 07/27/2013  . Influenza-Unspecified 07/19/2014, 07/26/2015  . PPD Test 02/03/2012  . Pneumococcal Polysaccharide-23 10/13/2010   Pertinent  Health Maintenance Due  Topic Date Due  . PNA vac Low Risk Adult (2 of 2 - PCV13) 10/14/2011  . INFLUENZA VACCINE  05/13/2016   Fall Risk  07/26/2015 03/01/2014 02/23/2013  Falls in the past year? Yes Yes Yes  Number falls in past yr: 1 1 2  or more  Injury with Fall? - No -  Risk for fall due to : History of fall(s) History of fall(s);Impaired balance/gait Impaired balance/gait;Impaired mobility  Follow up Falls evaluation completed - -   Functional Status Survey:    Filed Vitals:   02/26/16 1423  BP: 101/54  Pulse: 62  Temp: 98.1 F (36.7 C)  TempSrc: Oral  Resp: 16  Weight: 110 lb (49.896 kg)  SpO2: 92%   Body mass index is 20.11  kg/(m^2). Physical Exam  Constitutional: She appears well-developed. No distress.  Eyes:  glasses  Cardiovascular: Normal rate and regular rhythm.   Murmur heard. 3/6 systolic murmur 2nd ICS radiating to carotid  Pulmonary/Chest: Effort normal and breath sounds normal.  Abdominal: Soft. Bowel sounds are normal.  Musculoskeletal: Normal range of motion.  Neurological: She is alert.  Skin: Skin is warm and dry.  Psychiatric:  Pleasant, somewhat flat affect    Labs reviewed:  Recent Labs  03/27/15 08/28/15  NA 133* 134*  K 4.6 5.0  BUN 33* 29*  CREATININE 1.1 1.1    Recent Labs  03/27/15  AST 15  ALT 11  ALKPHOS 71    Recent Labs  03/27/15 08/28/15  WBC 5.9 5.1  HGB 10.9* 10.6*  HCT 32* 31*  PLT 227 217   Lab Results  Component Value Date   TSH 3.84 03/27/2015   No results found for: HGBA1C Lab Results  Component Value Date   CHOL 174 03/13/2011   HDL 65.50 03/13/2011   LDLCALC 95 03/13/2011   TRIG 67.0 03/13/2011   CHOLHDL 3 03/13/2011    Assessment/Plan 1. Weight loss, unintentional -stable over this month with remeron, cont same  2. Overactive bladder -continue myrbetriq--seems to have decreased nocturia per staff  3. Alzheimer's dementia without behavioral disturbance, unspecified timing of dementia onset -advanced, requires help with adls except feeding herself, minimally ambulatory since last fall and fracture -also is 89 and likely sarcopenic due to her age and deconditioning  4. CKD (chronic kidney disease), stage III -continue to avoid nephrotoxic drugs like nsaids, push fluids and properly dose adjust as needed  5. Aortic stenosis -outside of her shortness of breath, no signs of progression -had followed with cardiology before, but no plans for intervention at 89 so will monitor here  6. Benign hypertensive heart disease without heart failure -bp well controlled  -will plan to d/c prn clonidine as bp not nearly reaching 99991111  systolic  7. Macular degeneration (senile) of retina -cont  vitamins   Family/ staff Communication: discussed with memory care staff  Labs/tests ordered:  No new; needs tdap, prevnar if family agreeable to these

## 2016-03-04 ENCOUNTER — Ambulatory Visit: Payer: Medicare Other | Admitting: Cardiology

## 2016-03-16 IMAGING — CR DG THORACIC SPINE 3V
4 series · 4 of 4 positions shown · non-contrast
Comparison: Thoracic MRI April 25, 2011

CLINICAL DATA: Pain following fall

EXAM:
THORACIC SPINE - 2 VIEW + SWIMMERS

[t thoracic spine ap]
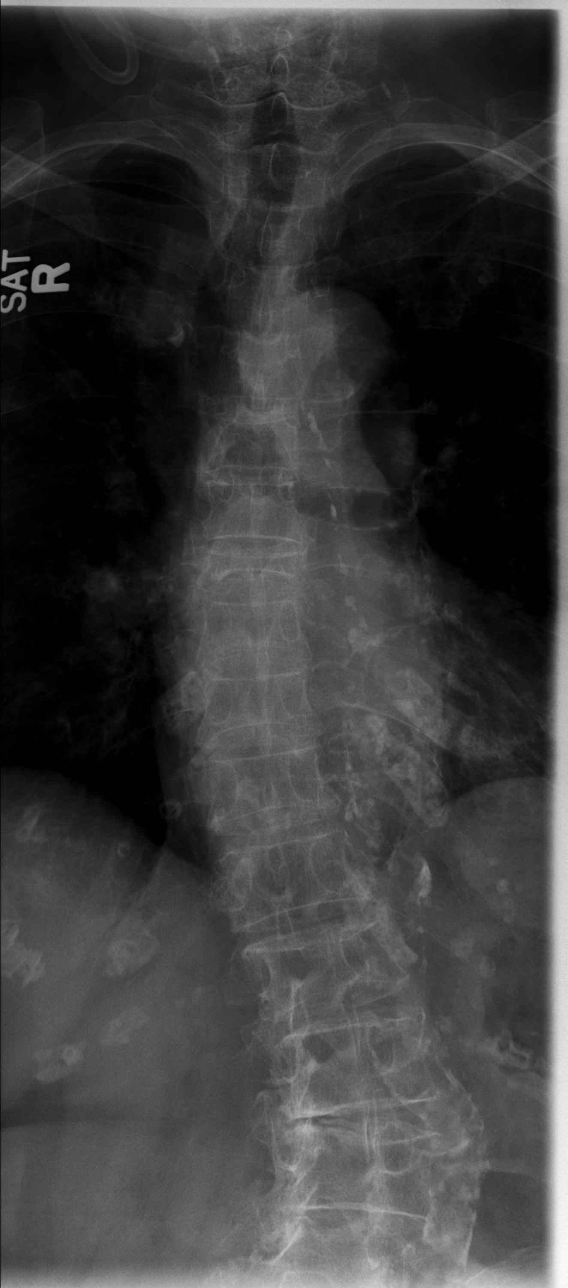

[w thoracic spine lat (1 of 2)]
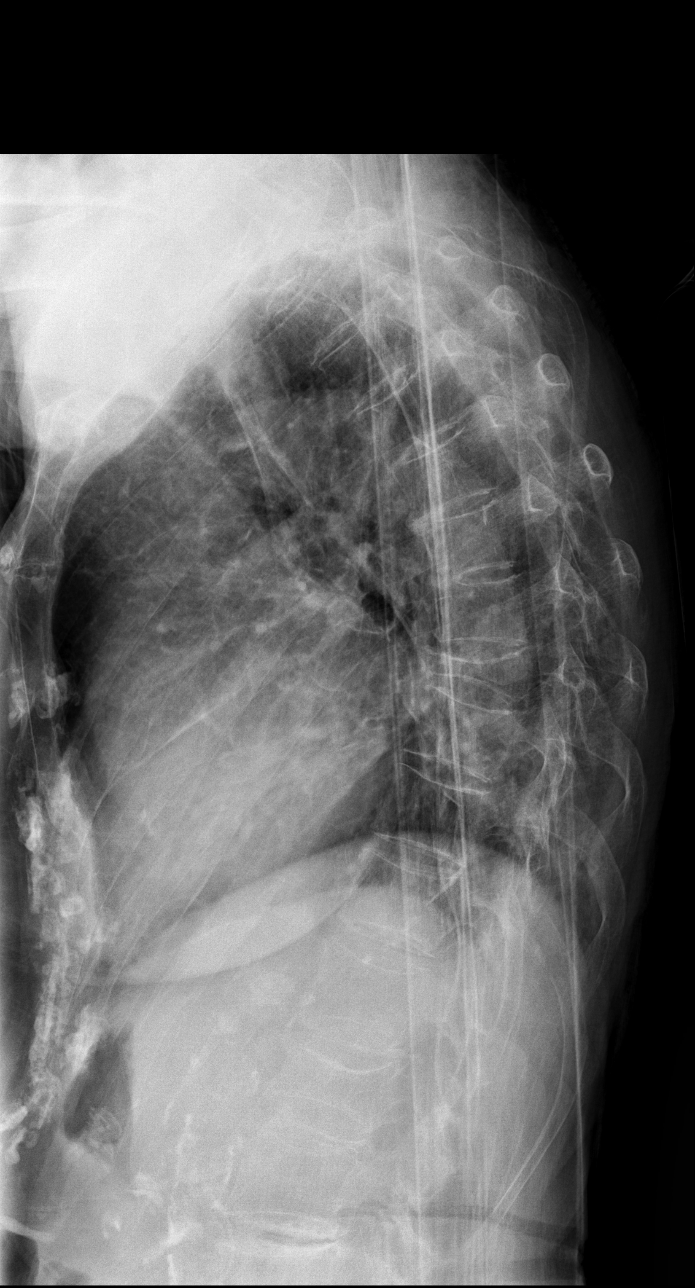

[w thoracic spine lat (2 of 2)]
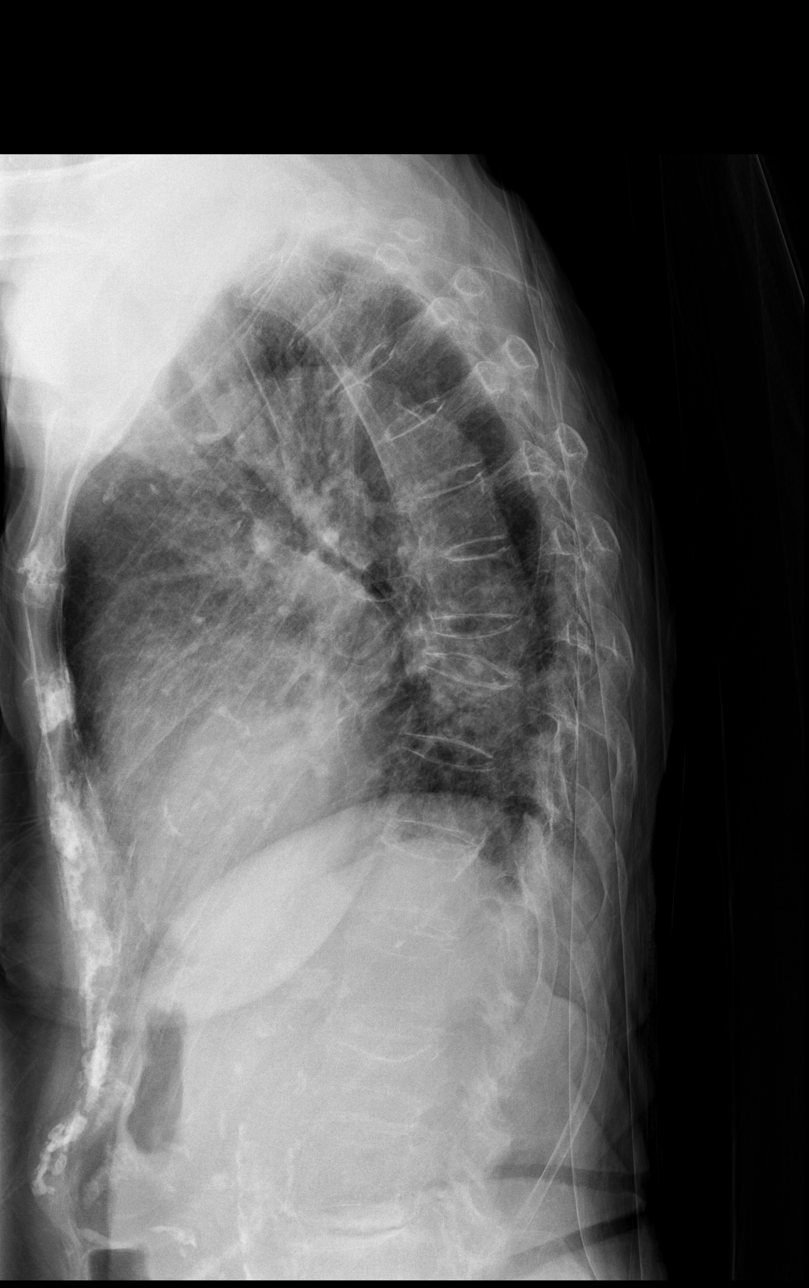

[w thoracic swimmers]
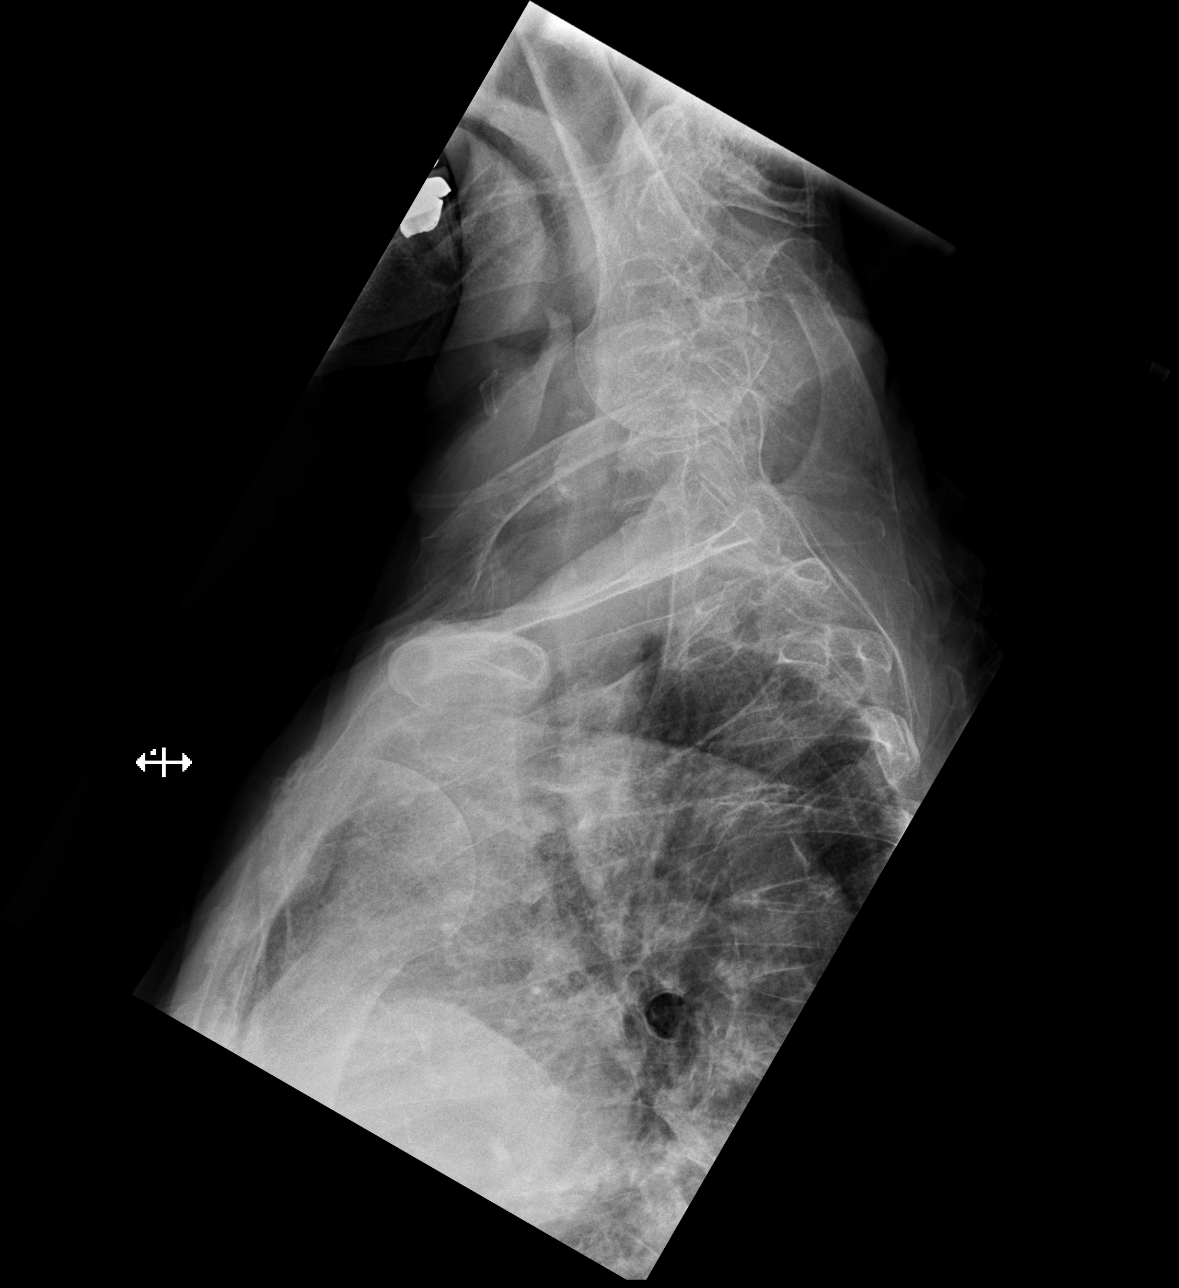

[4 of 4 positions shown; findings below may reference images not displayed]

FINDINGS: Frontal, lateral, and swimmer's views were obtained. Marked collapse
of the T8 vertebral body is a stable finding. There is milder
anterior wedging of the L1 vertebral body, chronic and stable. No
acute fracture. No spondylolisthesis. There is thoracolumbar
levoscoliosis. There is mild disc space narrowing at multiple
levels.
IMPRESSION: Compression fractures at T8 and L1, chronic and stable. Scoliosis.
No acute fracture. No spondylolisthesis. Mild osteoarthritic change.

## 2016-03-16 IMAGING — CR DG PELVIS 1-2V
1 series · 1 of 1 positions shown · non-contrast
Comparison: None.

CLINICAL DATA: Fell attempting to get in today at secondary to loss
of balance. Left leg pain.

EXAM:
PELVIS - 1-2 VIEW

[t pelvis ap]
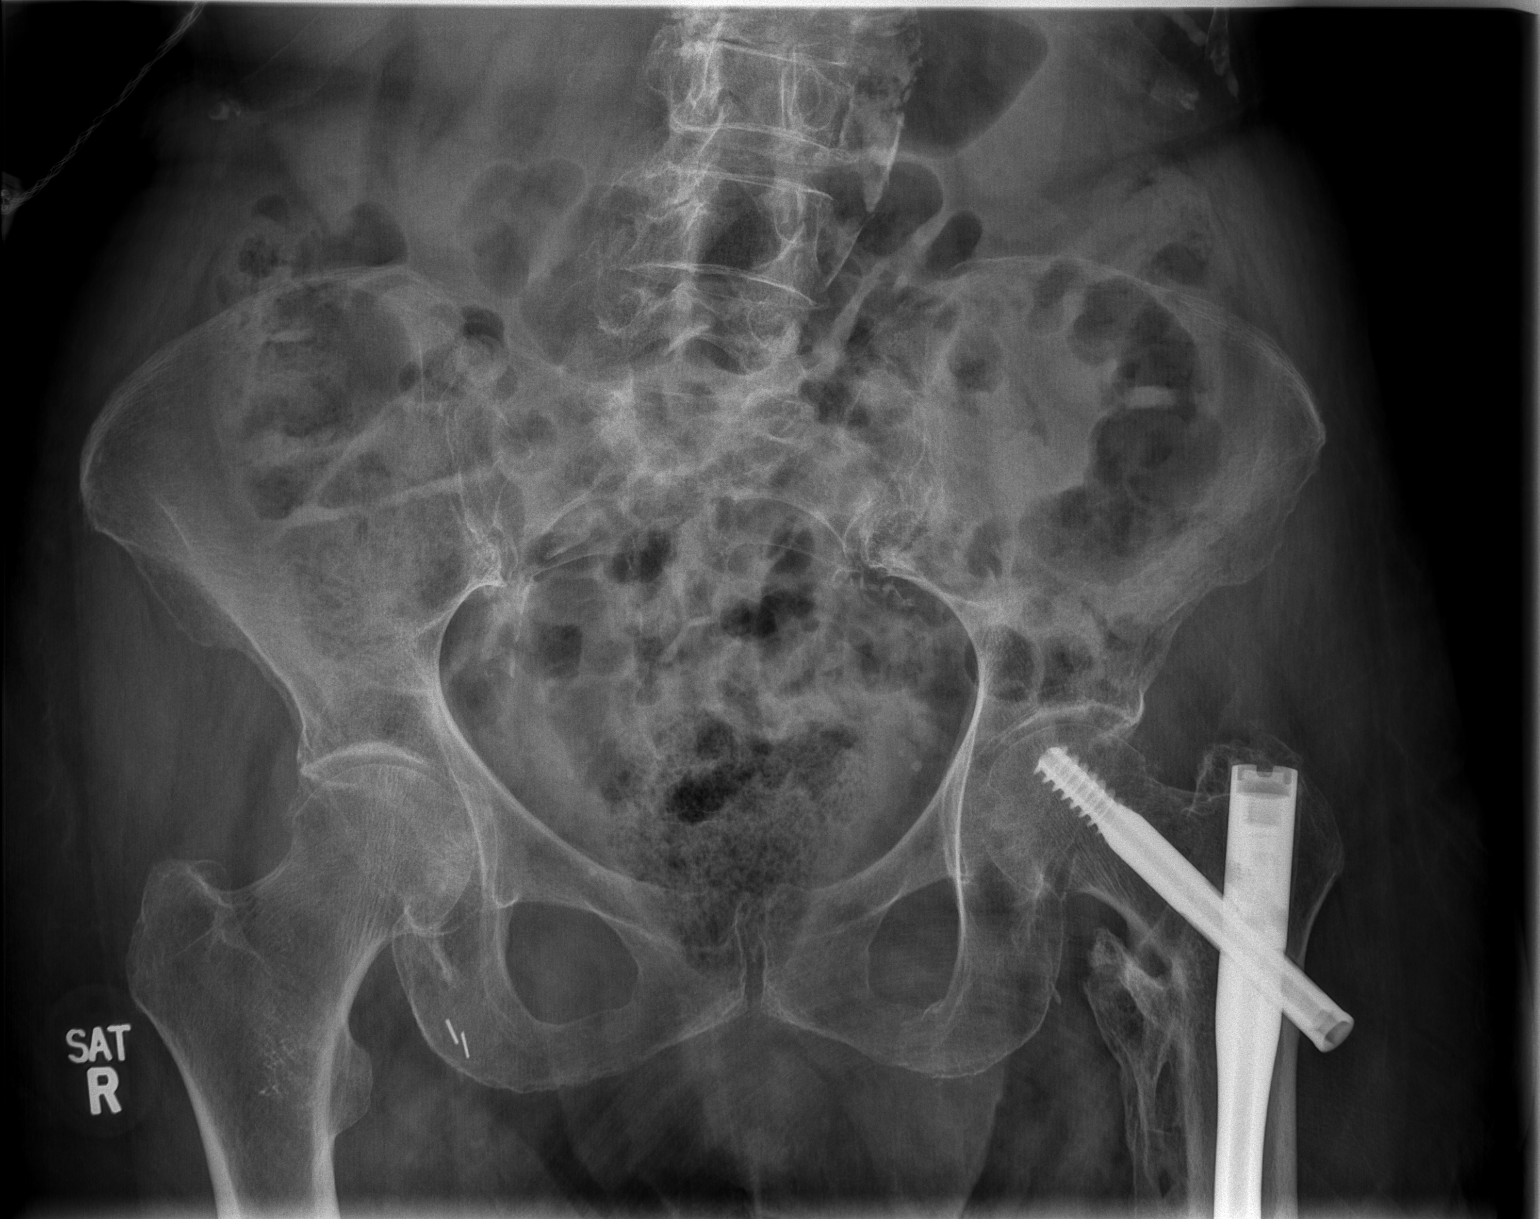

[1 of 1 positions shown; findings below may reference images not displayed]

FINDINGS: Previous gamma nail treatment of a left femur fracture. Question
subtle evidence of inferior ramus fracture on the left. No other
sign of acute pelvic injury.
IMPRESSION: Question inferior ramus fracture on the left.

## 2016-03-16 IMAGING — CR DG CHEST 2V
2 series · 2 of 2 positions shown · non-contrast
Comparison: May 06, 2011

ADDENDUM:
Prior MRI of the thoracic spine from 6556 has become available. The
T8 fracture is chronic and stable based on that study.
CLINICAL DATA: Pain after falling forward

EXAM:
CHEST  2 VIEW

[w chest lat]
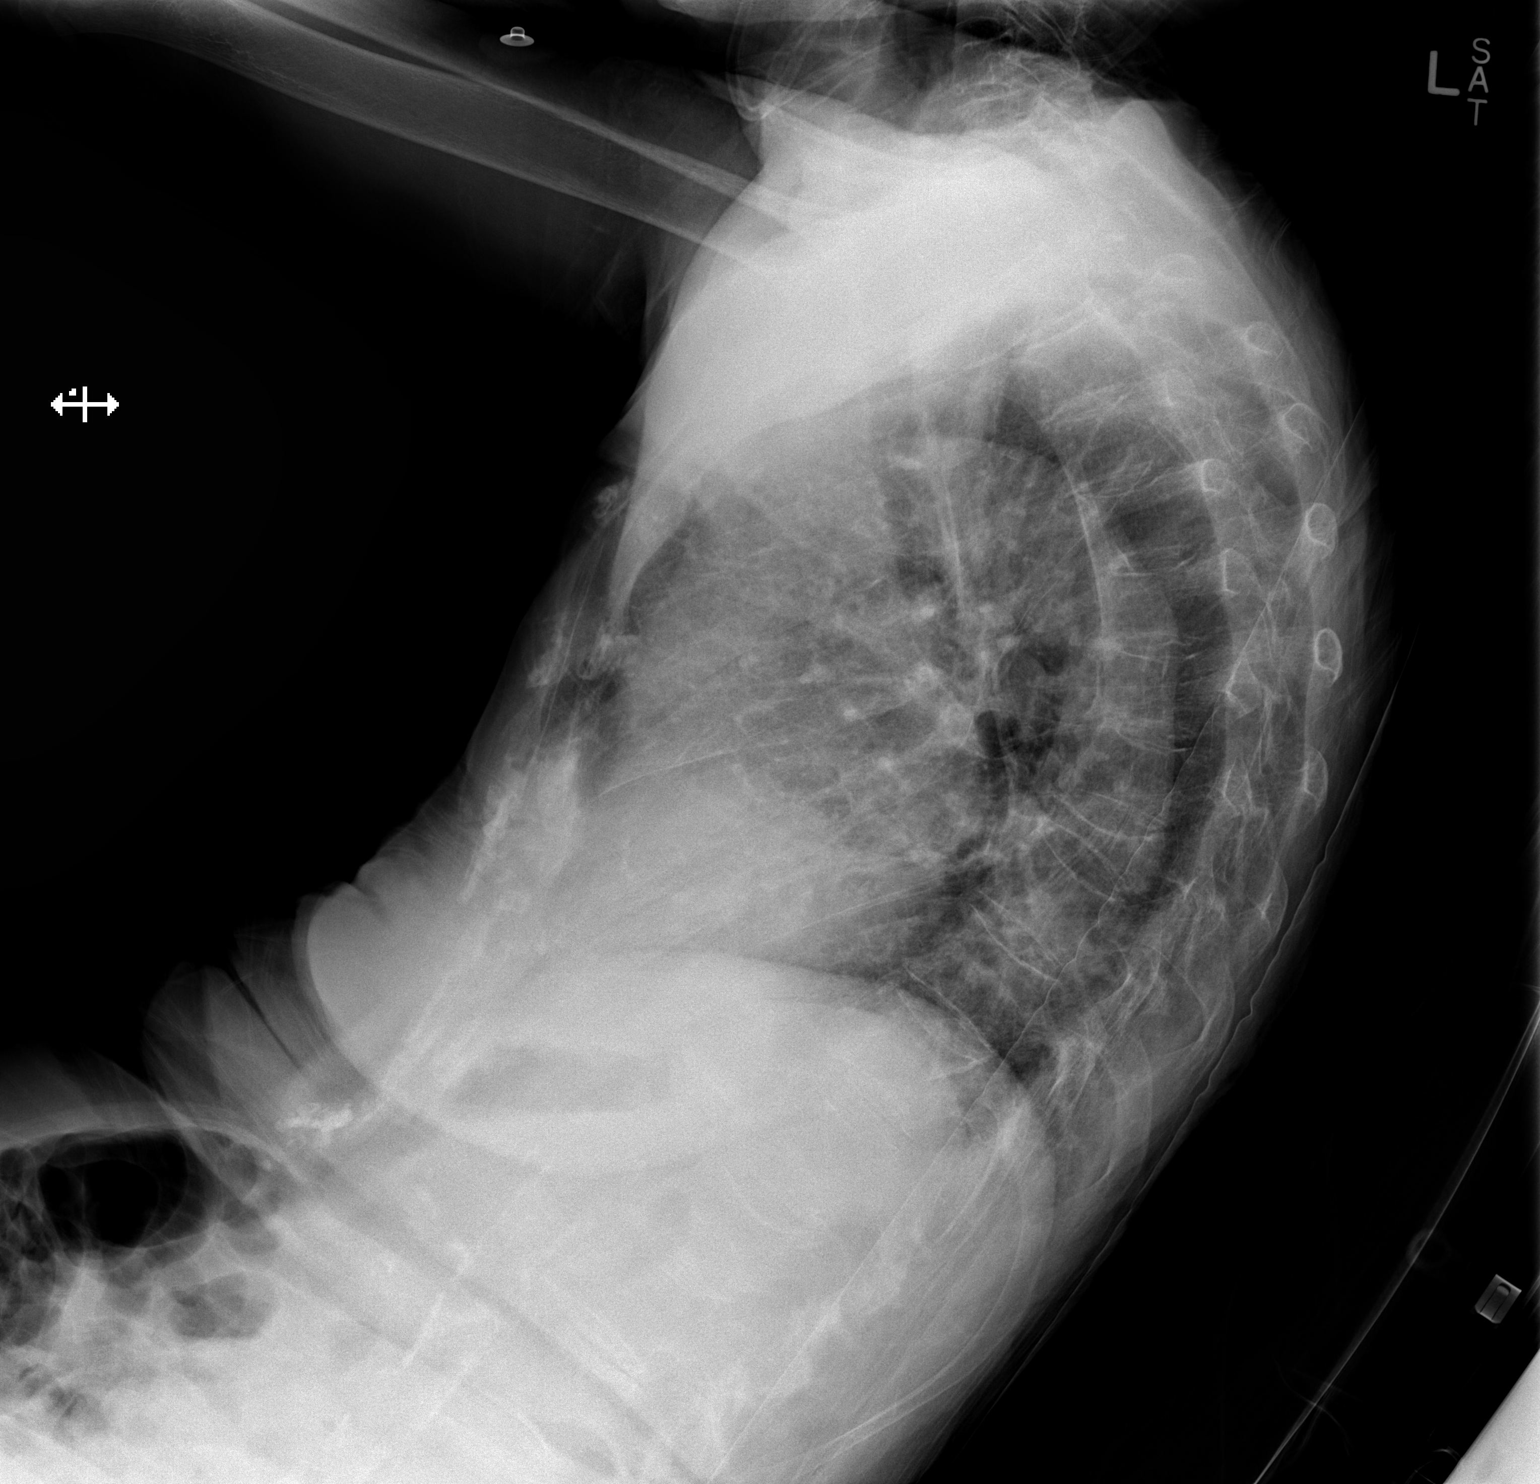

[x chest ap]
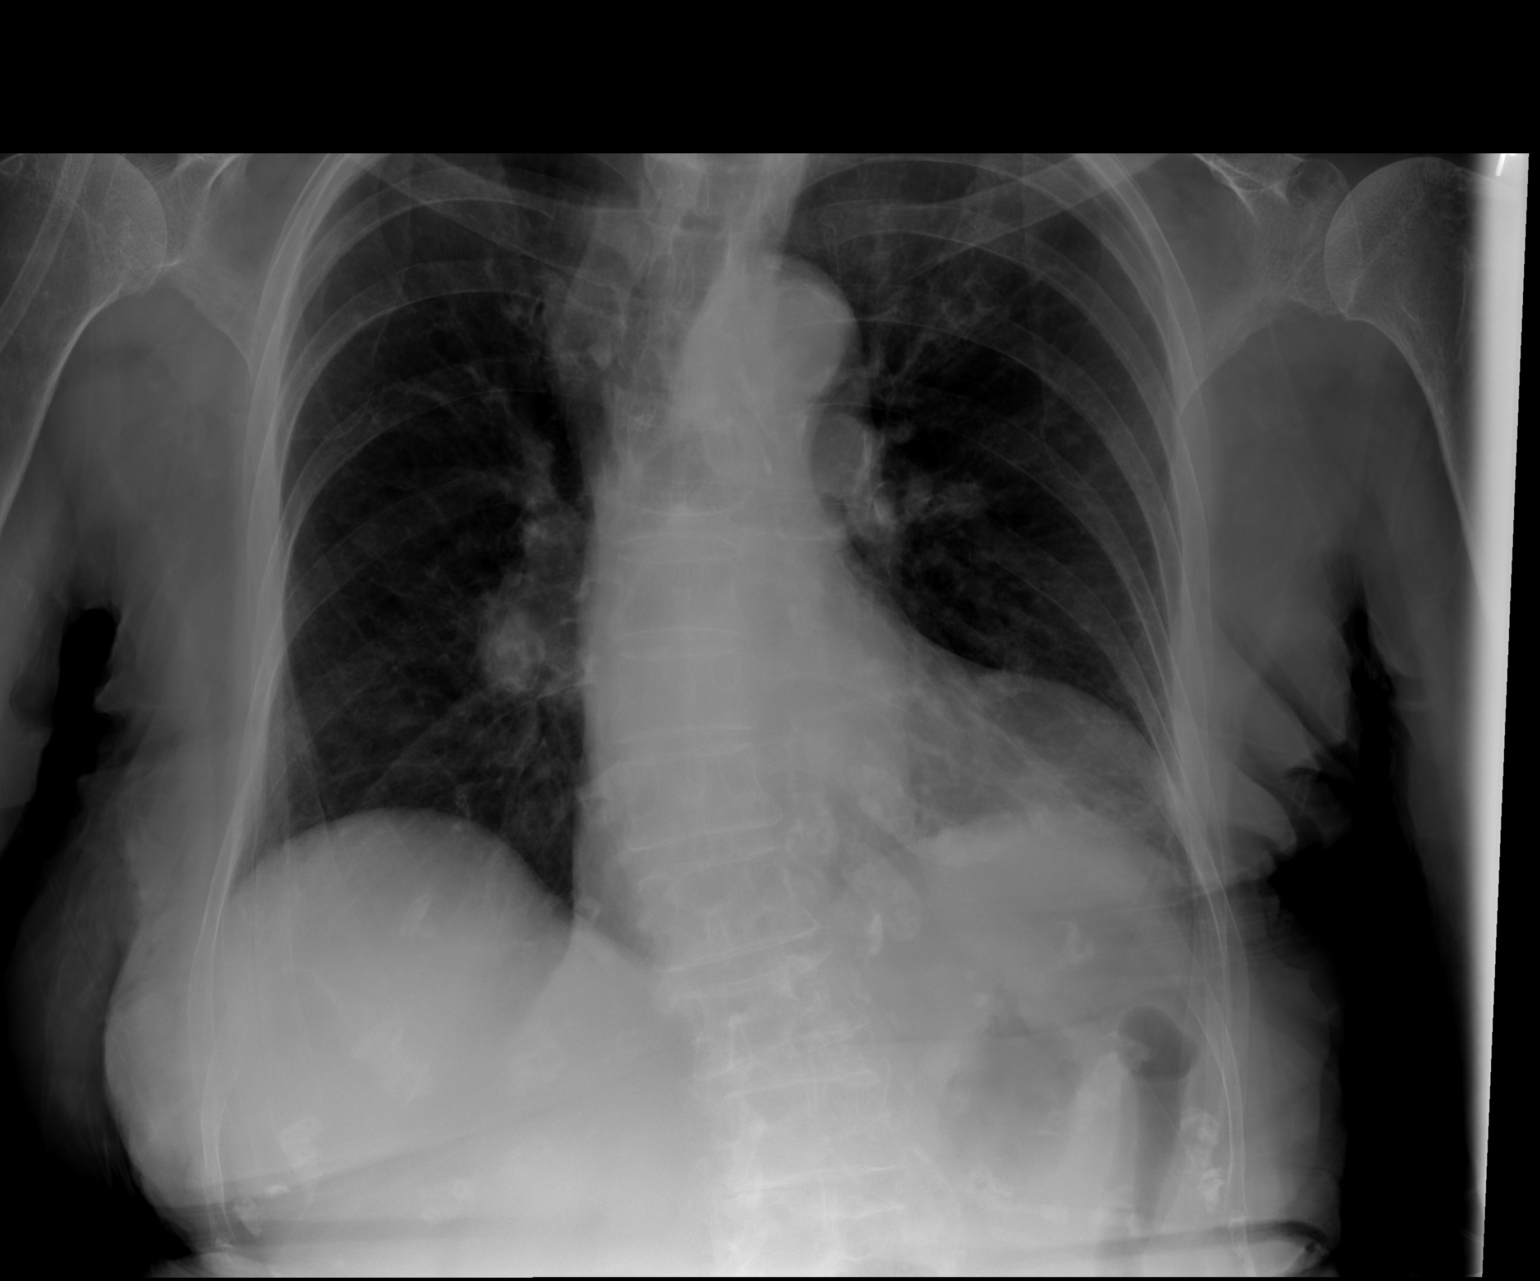

[2 of 2 positions shown; findings below may reference images not displayed]

FINDINGS: There is marked collapse of a mid thoracic vertebral body which may
be recent. There is no spondylolisthesis.

There is slight atelectasis in the left base. There is a focal
nodular opacity lateral to the left hilum measuring 1.1 x 0.9 cm.
Elsewhere, lungs are clear. Heart is upper normal in size with
pulmonary vascularity within normal limits. There is atherosclerotic
change in aorta.
IMPRESSION: 1.1 x 0.9 cm nodular opacity left perihilar region. If potential
therapy would change based on a nodular opacity in the lung in this
age group, it would be appropriate to consider noncontrast enhanced
chest CT to further assess.

No edema or consolidation.

Marked collapse of a mid thoracic vertebral body of uncertain age. A
recent fracture in this area cannot be excluded.

## 2016-05-09 ENCOUNTER — Non-Acute Institutional Stay: Payer: Medicare Other | Admitting: Adult Health

## 2016-05-09 DIAGNOSIS — G301 Alzheimer's disease with late onset: Secondary | ICD-10-CM

## 2016-05-09 DIAGNOSIS — K5901 Slow transit constipation: Secondary | ICD-10-CM | POA: Diagnosis not present

## 2016-05-09 DIAGNOSIS — I350 Nonrheumatic aortic (valve) stenosis: Secondary | ICD-10-CM

## 2016-05-09 DIAGNOSIS — M160 Bilateral primary osteoarthritis of hip: Secondary | ICD-10-CM

## 2016-05-09 DIAGNOSIS — R634 Abnormal weight loss: Secondary | ICD-10-CM | POA: Diagnosis not present

## 2016-05-09 DIAGNOSIS — I119 Hypertensive heart disease without heart failure: Secondary | ICD-10-CM | POA: Diagnosis not present

## 2016-05-09 DIAGNOSIS — F0280 Dementia in other diseases classified elsewhere without behavioral disturbance: Secondary | ICD-10-CM

## 2016-05-22 NOTE — Progress Notes (Signed)
Patient ID: Kendra Tapia, female   DOB: 1912/08/14, 82 y.o.   MRN: [id number]  Location[facility]   [facility]   Place of Service:  ALF (13) Provider:  Cindi Carbon, ANP  Patient Care Team: Gayland Curry, DO as PCP - General (Geriatric Medicine) [facility] Darlin Coco, MD as Consulting Physician (Cardiology)  Extended Emergency Contact Information Primary Emergency Contact: [name],[name] Address: [place], [place] Phone: [phone] Relation: Relative  Code Status:  DNR Goals of care: Advanced Directive information Advanced Directives 05/22/2016  Does patient have an advance directive? Yes  Type of Advance Directive [place]  Does patient want to make changes to advanced directive? Yes - information given  Copy of advanced directive(s) in chart? Yes  Pre-existing out of facility DNR order (yellow form or pink MOST form) -     Chief Complaint  Patient presents with  . Medical Management of Chronic Issues    HPI:  Pt is a 82 y.o. female seen today for medical management of chronic diseases.   1. Late onset Alzheimer's disease without behavioral disturbance Verbal but can not answer q's No longer ambulatory Can not perform for MMSE  2. Primary osteoarthritis of both hips Denies pain but has a hx of this No longer weight bearing Tylenol 650 mg BID  3. Slow transit constipation Issues with not taking miralax Started on Linzess  4. Aortic stenosis Hx of this with audible murmur BP controlled, previously followed by Dr. Mare Ferrari No sob or CP reported  5. Benign hypertensive heart disease without heart failure Weight stable, BP controlled Currently on Norvasc 10 mg qd Cozaar 50 mg qd  6. Weight loss, unintentional Currently on Remeron, weight 110 lbs, stable from last month Poor overall appetite  Past Medical History:  Diagnosis Date  . Abnormality of gait  09/24/2011  . Anemia, unspecified 08/13/2011  . Closed fracture of unspecified part of vertebral column without mention of spinal cord injury 08/13/2011  . Coronary artery disease   . Depressive disorder, not elsewhere classified 11/10/2011  . Dermatophytosis of foot 05/10/2012  . Dizziness   . Exudative senile macular degeneration of retina ([place]) 07/28/2012  . Female stress incontinence 02/11/2012  . Hyperlipidemia   . Hypertension   . Hyposmolality and/or hyponatremia 08/13/2011  . Hypothyroidism   . IHD (ischemic heart disease)   . Loss of weight 11/10/2011  . Macular degeneration (senile) of retina, unspecified 09/24/2011  . Malignant neoplasm of breast (female), unspecified site 08/13/2011  . Memory deficit 03/02/2011    2012: HAs STMl, repeats herself, some difficulkty with sequence of events. Functional status, language skills are  intact.  . Memory loss 09/24/2011  . Nausea   . Nocturia 09/24/2011  . Other malaise and fatigue 11/10/2011  . Pathologic fracture of vertebrae ([place]) 09/24/2011  . Personal history of fall 08/13/2011  . Tear film insufficiency, unspecified 07/28/2012  . Unspecified constipation 05/10/2012  . Unspecified disorder of kidney and ureter 09/24/2011  . Unspecified urinary incontinence 05/10/2012  . Urethrocele(618.03) 02/11/2012   Past Surgical History:  Procedure Laterality Date  . AV FISTULA REPAIR    . CORONARY ANGIOPLASTY  03/23/94   NORMAL LEFT VENTRICULAR SYSTOLIC FUNCTION, EF [id number]  . CORONARY ANGIOPLASTY WITH STENT PLACEMENT     RIGHT CORONARY ARTERY  . HIP SURGERY Left 10/07/2010   Dr. Rodell Perna    . MASTECTOMY Left  left     Allergies  Allergen Reactions  . Lipitor [Atorvastatin Calcium]     MUSCLE ACHES  . Micardis [Telmisartan]   . Other     Adhesive tape,chocolate,citrus,berriespaper tape      Medication List       Accurate as of 05/09/16 11:59 PM. Always use your most recent med list.          acetaminophen 325 MG  tablet Commonly known as:  TYLENOL Take 325 mg by mouth. Take 2 tablets once a day   amLODipine 10 MG tablet Commonly known as:  NORVASC Take 10 mg by mouth daily.   chlorhexidine 0.12 % solution Commonly known as:  PERIDEX Brush on 1 tsp of solution to teeth and gums with a toothbrush after PM mouth care   cloNIDine 0.1 MG tablet Commonly known as:  CATAPRES Take 0.1 mg by mouth daily as needed. Take 0.1mg  by mouth as need for systolic blood 99991111   hydroxypropyl methylcellulose / hypromellose 2.5 % ophthalmic solution Commonly known as:  ISOPTO TEARS / GONIOVISC Place 1 drop into both eyes 6 (six) times daily.   lactose free nutrition Liqd Take 237 mLs by mouth 2 (two) times daily between meals.   levothyroxine 25 MCG tablet Commonly known as:  SYNTHROID, LEVOTHROID Take 25 mcg by mouth daily before breakfast.   LINZESS 145 MCG Caps capsule Generic drug:  linaclotide Take 145 mcg by mouth daily before breakfast.   losartan 50 MG tablet Commonly known as:  COZAAR Take 1 tablet (50 mg total) by mouth daily.   metoprolol 50 MG tablet Commonly known as:  LOPRESSOR Take 50 mg by mouth 2 (two) times daily.   mirabegron ER 50 MG Tb24 tablet Commonly known as:  MYRBETRIQ Take 50 mg by mouth daily.   mirtazapine 15 MG tablet Commonly known as:  REMERON Take 7.5 mg by mouth daily.   NITROSTAT 0.4 MG SL tablet Generic drug:  nitroGLYCERIN Place 0.4 mg under the tongue as needed. Place 1 tablet under the tongue every 5 minutes as needed for chest pain up to 3 doses   Polyethyl Glycol-Propyl Glycol 0.4-0.3 % Soln Apply 1 drop to eye 4 (four) times daily - after meals and at bedtime.   multivitamin-lutein Caps capsule Take 1 capsule by mouth daily.   PRESERVISION AREDS PO Take 1 capsule by mouth. Twice a day       Review of Systems  Unable to perform ROS: Dementia  Constitutional: Negative for activity change, appetite change, chills, diaphoresis, fatigue,  fever and unexpected weight change.  HENT: Negative for congestion.   Respiratory: Negative for cough and shortness of breath.   Cardiovascular: Negative for chest pain.  Gastrointestinal: Negative for constipation and diarrhea.  Genitourinary: Negative for difficulty urinating and dysuria.  Musculoskeletal: Positive for arthralgias and gait problem. Negative for back pain.  Neurological: Negative for dizziness and facial asymmetry.  Psychiatric/Behavioral: Positive for confusion. Negative for agitation and behavioral problems.    Immunization History  Administered Date(s) Administered  . Influenza Whole 07/13/2012, 07/27/2013  . Influenza-Unspecified 07/19/2014, 07/26/2015  . PPD Test 02/03/2012  . Pneumococcal Polysaccharide-23 10/13/2010   Pertinent  Health Maintenance Due  Topic Date Due  . PNA vac Low Risk Adult (2 of 2 - PCV13) 10/14/2011  . INFLUENZA VACCINE  05/13/2016   Fall Risk  07/26/2015 03/01/2014 02/23/2013  Falls in the past year? Yes Yes Yes  Number falls in past yr: 1 1 2  or more  Injury with  Fall? - No -  Risk for fall due to : History of fall(s) History of fall(s);Impaired balance/gait Impaired balance/gait;Impaired mobility  Follow up Falls evaluation completed - -   Functional Status Survey:   Wt Readings from Last 3 Encounters:  05/22/16 110 lb (49.9 kg)  02/26/16 110 lb (49.9 kg)  01/31/16 108 lb (49 kg)   Vitals:   05/22/16 1448  BP: 102/77  Pulse: 80  Weight: 110 lb (49.9 kg)   Body mass index is 20.12 kg/m. Physical Exam  Constitutional: No distress.  HENT:  Head: Normocephalic and atraumatic.  Neck: No JVD present.  Cardiovascular: Normal rate and regular rhythm.   Murmur (3/6 systolic) heard. No edema  Pulmonary/Chest: Effort normal.  Normal BS  Abdominal: Soft. Bowel sounds are normal. She exhibits no distension.  Neurological: She is alert.  Oriented x1, f/c, difficulty following commands  Skin: Skin is warm and dry. She is not  diaphoretic.  Psychiatric: She has a normal mood and affect.  Nursing note and vitals reviewed.   Labs reviewed:  Recent Labs  08/28/15  NA 134*  K 5.0  BUN 29*  CREATININE 1.1   No results for input(s): AST, ALT, ALKPHOS, BILITOT, PROT, ALBUMIN in the last 8760 hours.  Recent Labs  08/28/15  WBC 5.1  HGB 10.6*  HCT 31*  PLT 217   Lab Results  Component Value Date   TSH 3.84 03/27/2015   No results found for: HGBA1C Lab Results  Component Value Date   CHOL 174 03/13/2011   HDL 65.50 03/13/2011   LDLCALC 95 03/13/2011   TRIG 67.0 03/13/2011   CHOLHDL 3 03/13/2011    Significant Diagnostic Results in last 30 days:  No results found.  Assessment/Plan 1. Late onset Alzheimer's disease without behavioral disturbance Advanced, no longer on meds Continue supportive care  2. Primary osteoarthritis of both hips Denies pain  Continue scheduled tylenol  3. Slow transit constipation Changed to linzess 145 mcg qd Awaiting result  4. Aortic stenosis Denis SOB or CP Medical management only due to age  27. Benign hypertensive heart disease without heart failure BP controlled Continue Cozaar and Norvasc Goal <150/90  6. Weight loss, unintentional Stable weight at 110 from previous readings Continue remeron   Family/ staff Communication: discussed with nurse  Labs/tests ordered:NA  Cindi Carbon, [place] [phone]

## 2016-07-15 ENCOUNTER — Encounter: Payer: Self-pay | Admitting: Internal Medicine

## 2016-07-15 ENCOUNTER — Non-Acute Institutional Stay: Payer: Medicare Other | Admitting: Internal Medicine

## 2016-07-15 DIAGNOSIS — F0280 Dementia in other diseases classified elsewhere without behavioral disturbance: Secondary | ICD-10-CM

## 2016-07-15 DIAGNOSIS — R634 Abnormal weight loss: Secondary | ICD-10-CM

## 2016-07-15 DIAGNOSIS — M160 Bilateral primary osteoarthritis of hip: Secondary | ICD-10-CM | POA: Diagnosis not present

## 2016-07-15 DIAGNOSIS — K5901 Slow transit constipation: Secondary | ICD-10-CM

## 2016-07-15 DIAGNOSIS — I119 Hypertensive heart disease without heart failure: Secondary | ICD-10-CM

## 2016-07-15 DIAGNOSIS — I350 Nonrheumatic aortic (valve) stenosis: Secondary | ICD-10-CM | POA: Diagnosis not present

## 2016-07-15 DIAGNOSIS — N3281 Overactive bladder: Secondary | ICD-10-CM

## 2016-07-15 DIAGNOSIS — G301 Alzheimer's disease with late onset: Secondary | ICD-10-CM

## 2016-07-15 NOTE — Progress Notes (Signed)
Patient ID: Kendra Tapia, female   DOB: July 22, 1912, 82 y.o.   MRN: [id number]  Location:  [facility] Room Number: [id number] memory care Place of Service:  ALF (13) Provider:  Carah Barrientes L. Mariea Clonts, D.O., C.M.D.  Hollace Kinnier, DO  Patient Care Team: Gayland Curry, DO as PCP - General (Geriatric Medicine) [facility] Darlin Coco, MD as Consulting Physician (Cardiology)  Extended Emergency Contact Information Primary Emergency Contact: [name],[name] Address: [place], [place] Phone: [phone] Relation: Relative  Code Status:  DNR Goals of care: Advanced Directive information Advanced Directives 07/15/2016  Does patient have an advance directive? Yes  Type of Advance Directive Out of facility DNR (pink MOST or yellow form);[place];Living will  Does patient want to make changes to advanced directive? -  Copy of advanced directive(s) in chart? Yes  Pre-existing out of facility DNR order (yellow form or pink MOST form) Yellow form placed in chart (order not valid for inpatient use);Pink MOST form placed in chart (order not valid for inpatient use)    Chief Complaint  Patient presents with  . Medical Management of Chronic Issues    routine visit    HPI:  Pt is a 82 y.o. female seen today for medical management of chronic diseases.    1. Late onset Alzheimer's disease without behavioral disturbance Verbal but can not answer q's No longer ambulatory Cannot perform for MMSE Incontinent, on myrbetriq Dependent in adls except to feed herself  2. Primary osteoarthritis of both hips Denies pain but has a hx of this No longer weight bearing Continues Tylenol 650 mg BID  3. Slow transit constipation Issues with not taking miralax Started on Linzess, but it was not effective for her and now she is not on a regular bowel regimen  4. Aortic  stenosis Hx of this with audible murmur BP controlled, previously followed by Dr. Mare Ferrari No sob, CP or syncope reported  5. Benign hypertensive heart disease without heart failure BP controlled Currently on Norvasc 10 mg qd Cozaar 50 mg qd Lopressor 50mg  po bid Clonidine 0.1mg  daily as needed  6. Weight loss, unintentional Currently on Remeron 7.5mg  which apparently is not working well any longer, weight 104 lbs, down 6 lbs from last month Poor overall appetite  Past Medical History:  Diagnosis Date  . Abnormality of gait 09/24/2011  . Anemia, unspecified 08/13/2011  . Closed fracture of unspecified part of vertebral column without mention of spinal cord injury 08/13/2011  . Coronary artery disease   . Depressive disorder, not elsewhere classified 11/10/2011  . Dermatophytosis of foot 05/10/2012  . Dizziness   . Exudative senile macular degeneration of retina ([place]) 07/28/2012  . Female stress incontinence 02/11/2012  . Hyperlipidemia   . Hypertension   . Hyposmolality and/or hyponatremia 08/13/2011  . Hypothyroidism   . IHD (ischemic heart disease)   . Loss of weight 11/10/2011  . Macular degeneration (senile) of retina, unspecified 09/24/2011  . Malignant neoplasm of breast (female), unspecified site 08/13/2011  . Memory deficit 03/02/2011    2012: HAs STMl, repeats herself, some difficulkty with sequence of events. Functional status, language skills are  intact.  . Memory loss 09/24/2011  . Nausea   . Nocturia 09/24/2011  . Other malaise and fatigue 11/10/2011  . Pathologic fracture of vertebrae 09/24/2011  . Personal history of fall 08/13/2011  . Tear film insufficiency, unspecified  07/28/2012  . Unspecified constipation 05/10/2012  . Unspecified disorder of kidney and ureter 09/24/2011  . Unspecified urinary incontinence 05/10/2012  . Urethrocele(618.03) 02/11/2012   Past Surgical History:  Procedure Laterality Date  . AV FISTULA REPAIR    . CORONARY ANGIOPLASTY   03/23/94   NORMAL LEFT VENTRICULAR SYSTOLIC FUNCTION, EF [id number]  . CORONARY ANGIOPLASTY WITH STENT PLACEMENT     RIGHT CORONARY ARTERY  . HIP SURGERY Left 10/07/2010   Dr. Rodell Perna    . MASTECTOMY Left    left     Allergies  Allergen Reactions  . Lipitor [Atorvastatin Calcium]     MUSCLE ACHES  . Micardis [Telmisartan]   . Other     Adhesive tape,chocolate,citrus,berriespaper tape      Medication List       Accurate as of 07/15/16  2:13 PM. Always use your most recent med list.          acetaminophen 325 MG tablet Commonly known as:  TYLENOL Take 325 mg by mouth. Take 2 tablets once a day   amLODipine 5 MG tablet Commonly known as:  NORVASC Take 5 mg by mouth daily.   chlorhexidine 0.12 % solution Commonly known as:  PERIDEX Brush on 1 tsp of solution to teeth and gums with a toothbrush after PM mouth care   cloNIDine 0.1 MG tablet Commonly known as:  CATAPRES Take 0.1 mg by mouth daily as needed. Take 0.1mg  by mouth as need for systolic blood 99991111   lactose free nutrition Liqd Take 237 mLs by mouth 2 (two) times daily between meals as needed.   levothyroxine 25 MCG tablet Commonly known as:  SYNTHROID, LEVOTHROID Take 25 mcg by mouth daily before breakfast.   losartan 50 MG tablet Commonly known as:  COZAAR Take 1 tablet (50 mg total) by mouth daily.   metoprolol 50 MG tablet Commonly known as:  LOPRESSOR Take 50 mg by mouth 2 (two) times daily.   mirabegron ER 50 MG Tb24 tablet Commonly known as:  MYRBETRIQ Take 50 mg by mouth daily.   mirtazapine 15 MG tablet Commonly known as:  REMERON Take 7.5 mg by mouth daily.   NITROSTAT 0.4 MG SL tablet Generic drug:  nitroGLYCERIN Place 0.4 mg under the tongue as needed. Place 1 tablet under the tongue every 5 minutes as needed for chest pain up to 3 doses   Polyethyl Glycol-Propyl Glycol 0.4-0.3 % Soln Apply 1 drop to eye 4 (four) times daily - after meals and at bedtime.   multivitamin-lutein  Caps capsule Take 1 capsule by mouth daily.   PRESERVISION AREDS PO Take 1 capsule by mouth. Twice a day       Review of Systems  Constitutional: Positive for activity change, appetite change and fatigue. Negative for chills and fever.       Wt loss and poor appetite persist  HENT: Negative for congestion.   Respiratory: Negative for chest tightness and shortness of breath.   Cardiovascular: Negative for chest pain and leg swelling.  Gastrointestinal: Negative for abdominal distention, abdominal pain, blood in stool, constipation, diarrhea, nausea and vomiting.  Genitourinary: Negative for dysuria.  Musculoskeletal: Positive for arthralgias and gait problem.       Recent fall, left raccoon eye  Skin: Positive for pallor.       Bruise around left eye  Neurological: Negative for light-headedness and headaches.  Hematological: Bruises/bleeds easily.  Psychiatric/Behavioral: Positive for confusion and sleep disturbance. Negative for agitation.    Immunization History  Administered Date(s) Administered  . Influenza Whole 07/13/2012, 07/27/2013  . Influenza-Unspecified 07/19/2014, 07/26/2015  . PPD Test 02/03/2012  . Pneumococcal Polysaccharide-23 10/13/2010   Pertinent  Health Maintenance Due  Topic Date Due  . PNA vac Low Risk Adult (2 of 2 - PCV13) 10/14/2011  . INFLUENZA VACCINE  05/13/2016   Fall Risk  07/26/2015 03/01/2014 02/23/2013  Falls in the past year? Yes Yes Yes  Number falls in past yr: 1 1 2  or more  Injury with Fall? - No -  Risk for fall due to : History of fall(s) History of fall(s);Impaired balance/gait Impaired balance/gait;Impaired mobility  Follow up Falls evaluation completed - -   Functional Status Survey:    Vitals:   07/15/16 1405  BP: (!) 152/77  Pulse: 70  Resp: (!) 22  Temp: (!) 96.6 F (35.9 C)  TempSrc: Oral  SpO2: 92%  Weight: 104 lb (47.2 kg)   Body mass index is 19.02 kg/m. Physical Exam  Constitutional: No distress.    Increasingly frail white female seated in wheelchair  Eyes: Conjunctivae are normal. Pupils are equal, round, and reactive to light.  glasses  Cardiovascular: Normal rate and regular rhythm.   Murmur heard. Pulmonary/Chest: Effort normal and breath sounds normal.  Abdominal: Soft. Bowel sounds are normal. She exhibits no distension and no mass. There is no tenderness. There is no rebound and no guarding.  Musculoskeletal: Normal range of motion.  Lymphadenopathy:    She has no cervical adenopathy.  Neurological: She is alert.  Skin: Skin is warm and dry. There is pallor.  Ecchymoses around left eye    Labs reviewed:  Recent Labs  08/28/15  NA 134*  K 5.0  BUN 29*  CREATININE 1.1   No results for input(s): AST, ALT, ALKPHOS, BILITOT, PROT, ALBUMIN in the last 8760 hours.  Recent Labs  08/28/15  WBC 5.1  HGB 10.6*  HCT 31*  PLT 217   Lab Results  Component Value Date   TSH 3.84 03/27/2015   No results found for: HGBA1C Lab Results  Component Value Date   CHOL 174 03/13/2011   HDL 65.50 03/13/2011   LDLCALC 95 03/13/2011   TRIG 67.0 03/13/2011   CHOLHDL 3 03/13/2011   Assessment/Plan 1. Late onset Alzheimer's disease without behavioral disturbance Verbal but can not answer q's No longer ambulatory Cannot perform for MMSE Incontinent, on myrbetriq Dependent in adls except to feed herself  2. Primary osteoarthritis of both hips Denies pain but has a hx of this No longer weight bearing Continues Tylenol 650 mg BID  3. Slow transit constipation Issues with not taking miralax Started on Linzess, but it was not effective for her and now she is not on a regular bowel regimen  4. Aortic stenosis Hx of this with audible murmur BP controlled, previously followed by Dr. Mare Ferrari No sob, CP or syncope reported  5. Benign hypertensive heart disease without heart failure BP controlled Currently on Norvasc 10 mg qd Cozaar 50 mg qd Lopressor 50mg  po  bid Clonidine 0.1mg  daily as needed  6. Weight loss, unintentional Currently on Remeron 7.5mg  which apparently is not working well any longer--might consider stopping it due to lack of benefit, weight 104 lbs, down 6 lbs from last month Poor overall appetite  7.  Overactive bladder Cont myrbetriq--will need to confirm if this is helping at all as her dementia is progressing with increased FTT--suspect her incontinence is more functional at this stage  Family/ staff Communication: discussed with memory  care nursing  Labs/tests ordered:  Needs prevnar

## 2016-08-18 ENCOUNTER — Non-Acute Institutional Stay: Payer: Medicare Other | Admitting: Adult Health

## 2016-08-18 ENCOUNTER — Encounter: Payer: Self-pay | Admitting: Adult Health

## 2016-08-18 DIAGNOSIS — N3281 Overactive bladder: Secondary | ICD-10-CM | POA: Diagnosis not present

## 2016-08-18 DIAGNOSIS — I10 Essential (primary) hypertension: Secondary | ICD-10-CM | POA: Diagnosis not present

## 2016-08-18 DIAGNOSIS — R627 Adult failure to thrive: Secondary | ICD-10-CM

## 2016-08-18 DIAGNOSIS — J9601 Acute respiratory failure with hypoxia: Secondary | ICD-10-CM | POA: Diagnosis not present

## 2016-08-18 NOTE — Progress Notes (Signed)
Patient ID: Kendra Tapia, female   DOB: 1912/04/08, 82 y.o.   MRN: [id number]  [facility]   [facility]   Place of Service:  SNF (31) Provider:  Cindi Carbon, ANP  Patient Care Team: Gayland Curry, DO as PCP - General (Geriatric Medicine) [facility] Darlin Coco, MD as Consulting Physician (Cardiology)  Extended Emergency Contact Information Primary Emergency Contact: [name],[name] Address: [place], [place] Phone: [phone] Relation: Relative  Code Status:  DNR Goals of care: Advanced Directive information Advanced Directives 07/15/2016  Does patient have an advance directive? Yes  Type of Advance Directive Out of facility DNR (pink MOST or yellow form);[place];Living will  Does patient want to make changes to advanced directive? -  Copy of advanced directive(s) in chart? Yes  Pre-existing out of facility DNR order (yellow form or pink MOST form) Yellow form placed in chart (order not valid for inpatient use);Pink MOST form placed in chart (order not valid for inpatient use)     Chief Complaint  Patient presents with  . Acute Visit    hypoxia    HPI:  Pt is a 82 y.o. female who resides in [place] seen today for hypoxia and decreased mental status. Staff reports that she has had a low grade fever t-max 100.6 on 11/5. She has a temp of 99.0 today. She had been placed on O2 on 11/4 when her sats were 85% on room air. Today sats are 90% on 2L. Her BP has been stable. She has had a minimal cough. Her oral mucosa is dry and she phlegm in the back of her throat. Her appetite is normally poor, and is worse now. She has lost 5 lb since August. Staff reports that her medications have been held over the past 2 days because she has had poor intake and not swallowing. Today she is more non-verbal and less interactive than usual. Sick contacts: multiple residents in the memory  care unit have had a resp infection She has a MOST form indicating comfort measures only with no antibiotics and no IV fluid.    Past Medical History:  Diagnosis Date  . Abnormality of gait 09/24/2011  . Anemia, unspecified 08/13/2011  . Closed fracture of unspecified part of vertebral column without mention of spinal cord injury 08/13/2011  . Coronary artery disease   . Depressive disorder, not elsewhere classified 11/10/2011  . Dermatophytosis of foot 05/10/2012  . Dizziness   . Exudative senile macular degeneration of retina ([place]) 07/28/2012  . Female stress incontinence 02/11/2012  . Hyperlipidemia   . Hypertension   . Hyposmolality and/or hyponatremia 08/13/2011  . Hypothyroidism   . IHD (ischemic heart disease)   . Loss of weight 11/10/2011  . Macular degeneration (senile) of retina, unspecified 09/24/2011  . Malignant neoplasm of breast (female), unspecified site 08/13/2011  . Memory deficit 03/02/2011    2012: HAs STMl, repeats herself, some difficulkty with sequence of events. Functional status, language skills are  intact.  . Memory loss 09/24/2011  . Nausea   . Nocturia 09/24/2011  . Other malaise and fatigue 11/10/2011  . Pathologic fracture of vertebrae 09/24/2011  . Personal history of fall 08/13/2011  . Tear film insufficiency, unspecified 07/28/2012  . Unspecified constipation 05/10/2012  . Unspecified disorder of kidney and ureter 09/24/2011  . Unspecified urinary incontinence 05/10/2012  . Urethrocele(618.03) 02/11/2012   Past Surgical History:  Procedure  Laterality Date  . AV FISTULA REPAIR    . CORONARY ANGIOPLASTY  03/23/94   NORMAL LEFT VENTRICULAR SYSTOLIC FUNCTION, EF [id number]  . CORONARY ANGIOPLASTY WITH STENT PLACEMENT     RIGHT CORONARY ARTERY  . HIP SURGERY Left 10/07/2010   Dr. Rodell Perna    . MASTECTOMY Left    left     Allergies  Allergen Reactions  . Lipitor [Atorvastatin Calcium]     MUSCLE ACHES  . Micardis [Telmisartan]   . Other     Adhesive  tape,chocolate,citrus,berriespaper tape      Medication List       Accurate as of 08/18/16 10:22 AM. Always use your most recent med list.          acetaminophen 325 MG tablet Commonly known as:  TYLENOL Take 325 mg by mouth. Take 2 tablets once a day   amLODipine 5 MG tablet Commonly known as:  NORVASC Take 5 mg by mouth daily.   chlorhexidine 0.12 % solution Commonly known as:  PERIDEX Brush on 1 tsp of solution to teeth and gums with a toothbrush after PM mouth care   cloNIDine 0.1 MG tablet Commonly known as:  CATAPRES Take 0.1 mg by mouth daily as needed. Take 0.1mg  by mouth as need for systolic blood 99991111   lactose free nutrition Liqd Take 237 mLs by mouth 2 (two) times daily between meals as needed.   levothyroxine 25 MCG tablet Commonly known as:  SYNTHROID, LEVOTHROID Take 25 mcg by mouth daily before breakfast.   losartan 50 MG tablet Commonly known as:  COZAAR Take 1 tablet (50 mg total) by mouth daily.   metoprolol 50 MG tablet Commonly known as:  LOPRESSOR Take 50 mg by mouth 2 (two) times daily.   mirabegron ER 50 MG Tb24 tablet Commonly known as:  MYRBETRIQ Take 50 mg by mouth daily.   NITROSTAT 0.4 MG SL tablet Generic drug:  nitroGLYCERIN Place 0.4 mg under the tongue as needed. Place 1 tablet under the tongue every 5 minutes as needed for chest pain up to 3 doses   Polyethyl Glycol-Propyl Glycol 0.4-0.3 % Soln Apply 1 drop to eye 4 (four) times daily - after meals and at bedtime.   polyethylene glycol packet Commonly known as:  MIRALAX / GLYCOLAX Take 17 g by mouth daily. Mix with beverage of choice. Hold for loose stools   multivitamin-lutein Caps capsule Take 1 capsule by mouth daily.   PRESERVISION AREDS PO Take 1 capsule by mouth. Twice a day   senna-docusate 8.6-50 MG tablet Commonly known as:  Senokot-S Take 2 tablets by mouth daily. Hold for loose stools       Review of Systems  Unable to perform ROS: Dementia     Immunization History  Administered Date(s) Administered  . Influenza Inj Mdck Quad Pf 07/31/2016  . Influenza Whole 07/13/2012, 07/27/2013  . Influenza-Unspecified 07/19/2014, 07/26/2015  . PPD Test 02/03/2012  . Pneumococcal Polysaccharide-23 10/13/2010   Pertinent  Health Maintenance Due  Topic Date Due  . PNA vac Low Risk Adult (2 of 2 - PCV13) 10/14/2011  . INFLUENZA VACCINE  Completed   Fall Risk  07/26/2015 03/01/2014 02/23/2013  Falls in the past year? Yes Yes Yes  Number falls in past yr: 1 1 2  or more  Injury with Fall? - No -  Risk for fall due to : History of fall(s) History of fall(s);Impaired balance/gait Impaired balance/gait;Impaired mobility  Follow up Falls evaluation completed - -   Functional  Status Survey:   Wt Readings from Last 3 Encounters:  08/18/16 105 lb 8 oz (47.9 kg)  07/15/16 104 lb (47.2 kg)  05/22/16 110 lb (49.9 kg)   Vitals:   08/18/16 0957  BP: 116/71  Pulse: 100  Resp: 20  Temp: 99 F (37.2 C)  SpO2: 90%  Weight: 105 lb 8 oz (47.9 kg)   There is no height or weight on file to calculate BMI. Physical Exam  Constitutional: No distress.  HENT:  Head: Normocephalic and atraumatic.  Right Ear: External ear normal.  Left Ear: External ear normal.  Mouth/Throat: Oropharyngeal exudate present.  Eyes: Pupils are equal, round, and reactive to light.  Neck: Neck supple. No JVD present.  Cardiovascular: Normal rate, regular rhythm and intact distal pulses.  Exam reveals no gallop and no friction rub.   Murmur (3/6 systolic) heard. No edema  Pulmonary/Chest: She is in respiratory distress (Mild respiratory distress with tachypnea). She has no wheezes. She has rales (Bilateral rales in lower lobes, worse on L side).  She is on 2L O2 via [place]  Abdominal: Soft. Bowel sounds are normal. She exhibits no distension. There is no tenderness.  Musculoskeletal: She exhibits no edema.  Lymphadenopathy:    She has no cervical adenopathy.   Neurological: She is alert.  Oriented x1, difficulty following commands. She is less verbal today.  Skin: Skin is warm and dry. She is not diaphoretic.  Psychiatric: She has a normal mood and affect.  Nursing note and vitals reviewed.   Labs reviewed:  Recent Labs  08/28/15  NA 134*  K 5.0  BUN 29*  CREATININE 1.1   No results for input(s): AST, ALT, ALKPHOS, BILITOT, PROT, ALBUMIN in the last 8760 hours.  Recent Labs  08/28/15  WBC 5.1  HGB 10.6*  HCT 31*  PLT 217   Lab Results  Component Value Date   TSH 3.84 03/27/2015   No results found for: HGBA1C Lab Results  Component Value Date   CHOL 174 03/13/2011   HDL 65.50 03/13/2011   LDLCALC 95 03/13/2011   TRIG 67.0 03/13/2011   CHOLHDL 3 03/13/2011    Significant Diagnostic Results in last 30 days:  No results found.  Assessment/Plan  1. Acute respiratory failure with hypoxia (HCC) - Mild respiratory distress with tachypnea -Continue humidified O2 on 2L for comfort care -Duoneb bid scheduled and every 6 hrs prn shortness of breath/cough/wheezing -Comfort care only per POA  -consider roxanol if condition worsens, resident appears comfortable at this time  2. Failure to thrive in adult - Encourage fluid intake  -Continue supportive care  3. Overactive bladder  -D/C Myrbetriq due to pt's incontinence, decreased oral intake and comfort care plan  4. Essential hypertension - Stable -Continue metoprolol, Cozaar daily, and clonidine prn -D/C Norvasc due to stable pattern of BP and comfort care plan    Family/ staff Communication: Staff discussed with POA  Labs/tests ordered:NA  Cindi Carbon, [place] [phone]

## 2016-08-20 ENCOUNTER — Encounter: Payer: Self-pay | Admitting: Internal Medicine

## 2016-08-20 ENCOUNTER — Non-Acute Institutional Stay (SKILLED_NURSING_FACILITY): Payer: Medicare Other | Admitting: Internal Medicine

## 2016-08-20 DIAGNOSIS — J9601 Acute respiratory failure with hypoxia: Secondary | ICD-10-CM | POA: Diagnosis not present

## 2016-08-20 DIAGNOSIS — G301 Alzheimer's disease with late onset: Secondary | ICD-10-CM | POA: Diagnosis not present

## 2016-08-20 DIAGNOSIS — J690 Pneumonitis due to inhalation of food and vomit: Secondary | ICD-10-CM

## 2016-08-20 DIAGNOSIS — F0280 Dementia in other diseases classified elsewhere without behavioral disturbance: Secondary | ICD-10-CM

## 2016-08-20 DIAGNOSIS — R627 Adult failure to thrive: Secondary | ICD-10-CM | POA: Diagnosis not present

## 2016-08-20 MED ORDER — MORPHINE SULFATE (CONCENTRATE) 20 MG/ML PO SOLN: 5.0000 mg | ORAL | 0 refills | Status: AC | PRN

## 2016-08-20 NOTE — Progress Notes (Signed)
Location:  Occupational psychologist of Service:  SNF (31) Provider:  Aneesha Holloran L. Mariea Clonts, D.O., C.M.D.  Hollace Kinnier, DO  Patient Care Team: Gayland Curry, DO as PCP - General (Geriatric Medicine) [facility] Darlin Coco, MD as Consulting Physician (Cardiology)  Extended Emergency Contact Information Primary Emergency Contact: [name],[name] Address: [place], [place] Phone: [phone] Relation: Relative  Code Status:  DNR, comfort measures per MOST, no tube feeding, no fluids, no abx, no hospitalization  Goals of care: Advanced Directive information Advanced Directives 08/20/2016  Does patient have an advance directive? Yes  Type of Paramedic of [place];Living will;Out of facility DNR (pink MOST or yellow form)  Does patient want to make changes to advanced directive? No - Patient declined  Copy of advanced directive(s) in chart? Yes  Pre-existing out of facility DNR order (yellow form or pink MOST form) Pink MOST form placed in chart (order not valid for inpatient use)     Chief Complaint  Patient presents with  . Acute Visit    tachypnea, hypoxia, respiratory distress this am    HPI:  Pt is a 82 y.o. female with advanced dementia, failure to thrive, dysphagia, and severe aortic stenosis seen today for an acute visit for worsening dyspnea this am. She was seen by NP 11/6 and put on oxygen and bid scheduled nebs (see goals of care above).  Her nephew has been informed that this am, she was breathing faster and shallowly and appeared uncomfortable. She did improve with the neb treatment but RR remained shallow.  When I saw her, her RR remained in the 20s, POX 90 on 2L.  she did open her eyes briefly.  Her nephew has opted not to come in b/c she does not know who he is anymore.    Past Medical History:  Diagnosis Date  . Abnormality of gait 09/24/2011    . Anemia, unspecified 08/13/2011  . Closed fracture of unspecified part of vertebral column without mention of spinal cord injury 08/13/2011  . Coronary artery disease   . Depressive disorder, not elsewhere classified 11/10/2011  . Dermatophytosis of foot 05/10/2012  . Dizziness   . Exudative senile macular degeneration of retina ([place]) 07/28/2012  . Female stress incontinence 02/11/2012  . Hyperlipidemia   . Hypertension   . Hyposmolality and/or hyponatremia 08/13/2011  . Hypothyroidism   . IHD (ischemic heart disease)   . Loss of weight 11/10/2011  . Macular degeneration (senile) of retina, unspecified 09/24/2011  . Malignant neoplasm of breast (female), unspecified site 08/13/2011  . Memory deficit 03/02/2011    2012: HAs STMl, repeats herself, some difficulkty with sequence of events. Functional status, language skills are  intact.  . Memory loss 09/24/2011  . Nausea   . Nocturia 09/24/2011  . Other malaise and fatigue 11/10/2011  . Pathologic fracture of vertebrae 09/24/2011  . Personal history of fall 08/13/2011  . Tear film insufficiency, unspecified 07/28/2012  . Unspecified constipation 05/10/2012  . Unspecified disorder of kidney and ureter 09/24/2011  . Unspecified urinary incontinence 05/10/2012  . Urethrocele(618.03) 02/11/2012   Past Surgical History:  Procedure Laterality Date  . AV FISTULA REPAIR    . CORONARY ANGIOPLASTY  03/23/94   NORMAL LEFT VENTRICULAR SYSTOLIC FUNCTION, EF [id number]  . CORONARY ANGIOPLASTY WITH STENT PLACEMENT     RIGHT CORONARY ARTERY  . HIP SURGERY Left 10/07/2010  Dr. Rodell Perna    . MASTECTOMY Left    left     Allergies  Allergen Reactions  . Lipitor [Atorvastatin Calcium]     MUSCLE ACHES  . Micardis [Telmisartan]   . Other     Adhesive tape,chocolate,citrus,berriespaper tape      Medication List       Accurate as of 08/20/16  1:08 PM. Always use your most recent med list.          acetaminophen 325 MG tablet Commonly known as:   TYLENOL Take 325 mg by mouth. Take 2 tablets once a day   amLODipine 5 MG tablet Commonly known as:  NORVASC Take 5 mg by mouth daily.   chlorhexidine 0.12 % solution Commonly known as:  PERIDEX Brush on 1 tsp of solution to teeth and gums with a toothbrush after PM mouth care   cloNIDine 0.1 MG tablet Commonly known as:  CATAPRES Take 0.1 mg by mouth daily as needed. Take 0.1mg  by mouth as need for systolic blood 99991111   lactose free nutrition Liqd Take 237 mLs by mouth 2 (two) times daily between meals as needed.   levothyroxine 25 MCG tablet Commonly known as:  SYNTHROID, LEVOTHROID Take 25 mcg by mouth daily before breakfast.   losartan 50 MG tablet Commonly known as:  COZAAR Take 1 tablet (50 mg total) by mouth daily.   metoprolol 50 MG tablet Commonly known as:  LOPRESSOR Take 50 mg by mouth 2 (two) times daily.   mirabegron ER 50 MG Tb24 tablet Commonly known as:  MYRBETRIQ Take 50 mg by mouth daily.   NITROSTAT 0.4 MG SL tablet Generic drug:  nitroGLYCERIN Place 0.4 mg under the tongue as needed. Place 1 tablet under the tongue every 5 minutes as needed for chest pain up to 3 doses   Polyethyl Glycol-Propyl Glycol 0.4-0.3 % Soln Apply 1 drop to eye 4 (four) times daily - after meals and at bedtime.   polyethylene glycol packet Commonly known as:  MIRALAX / GLYCOLAX Take 17 g by mouth daily. Mix with beverage of choice. Hold for loose stools   multivitamin-lutein Caps capsule Take 1 capsule by mouth daily.   PRESERVISION AREDS PO Take 1 capsule by mouth. Twice a day   senna-docusate 8.6-50 MG tablet Commonly known as:  Senokot-S Take 2 tablets by mouth daily. Hold for loose stools       Review of Systems  Unable to perform ROS: Acuity of condition    Immunization History  Administered Date(s) Administered  . Influenza Inj Mdck Quad Pf 07/31/2016  . Influenza Whole 07/13/2012, 07/27/2013  . Influenza-Unspecified 07/19/2014, 07/26/2015  .  PPD Test 02/03/2012  . Pneumococcal Polysaccharide-23 10/13/2010   Pertinent  Health Maintenance Due  Topic Date Due  . PNA vac Low Risk Adult (2 of 2 - PCV13) 10/14/2011  . INFLUENZA VACCINE  Completed   Fall Risk  07/26/2015 03/01/2014 02/23/2013  Falls in the past year? Yes Yes Yes  Number falls in past yr: 1 1 2  or more  Injury with Fall? - No -  Risk for fall due to : History of fall(s) History of fall(s);Impaired balance/gait Impaired balance/gait;Impaired mobility  Follow up Falls evaluation completed - -    Vitals:   08/20/16 1307  Pulse: 76  Resp: (!) 30  Temp: 98 F (36.7 C)  SpO2: 90%   There is no height or weight on file to calculate BMI. Physical Exam  Constitutional:  Appears to feel poorly,  opens eyes and rapidly closes them  Cardiovascular: Normal rate and regular rhythm.   Murmur heard. Pulmonary/Chest: She is in respiratory distress.  Tachypnea, shallow breathing  Abdominal: Soft. Bowel sounds are normal.  Neurological:  lethargic  Skin: Skin is warm and dry. There is pallor.    Labs reviewed:  Recent Labs  08/28/15  NA 134*  K 5.0  BUN 29*  CREATININE 1.1   No results for input(s): AST, ALT, ALKPHOS, BILITOT, PROT, ALBUMIN in the last 8760 hours.  Recent Labs  08/28/15  WBC 5.1  HGB 10.6*  HCT 31*  PLT 217   Lab Results  Component Value Date   TSH 3.84 03/27/2015   No results found for: HGBA1C Lab Results  Component Value Date   CHOL 174 03/13/2011   HDL 65.50 03/13/2011   LDLCALC 95 03/13/2011   TRIG 67.0 03/13/2011   CHOLHDL 3 03/13/2011    Assessment/Plan 1. Acute respiratory failure with hypoxia (HCC) -cont oxygen and nebs for comfort -d/c all po meds as she is unable to swallow now -roxanol 5mg  po q 4 hr prn sob or respiratory distress -may add ativan or scopolamine patch as needed for anxiety, uncontrolled dyspnea or secretions   2. Aspiration pneumonia due to gastric secretions, unspecified laterality, unspecified  part of lung ([place]) -suspected etiology of respiratory failure with worsening lethargy, dysphagia  3. Failure to thrive in adult -has been progressive with weight loss, decreasing intake, more lethargy and time sleeping -comfort measures per MOST from discussion held by NP with her nephew  4. Late onset Alzheimer's disease without behavioral disturbance -end stages, now with complications as above -cont comfort measures  Family/ staff Communication: discussed with memory care nurse and nurse manager  Labs/tests ordered:  None per goals of care  Sadie Hazelett L. Farran Amsden, D.O. [facility] [address], [place] [address] Cell Phone (Mon-Fri 8am-5pm):  [phone] On Call:  [phone] & follow prompts after 5pm & weekends Office Phone:  [phone] Office Fax:  [phone]

## 2016-09-12 DEATH — deceased
